# Patient Record
Sex: Male | Born: 1935
Health system: Southern US, Community
[De-identification: ages and names within clinical notes are randomized; demographics above are authoritative.]

## PROBLEM LIST (undated history)

## (undated) DIAGNOSIS — I2510 Atherosclerotic heart disease of native coronary artery without angina pectoris: Secondary | ICD-10-CM

## (undated) DIAGNOSIS — K5790 Diverticulosis of intestine, part unspecified, without perforation or abscess without bleeding: Secondary | ICD-10-CM

## (undated) DIAGNOSIS — Z950 Presence of cardiac pacemaker: Secondary | ICD-10-CM

## (undated) DIAGNOSIS — K52831 Collagenous colitis: Secondary | ICD-10-CM

## (undated) DIAGNOSIS — K219 Gastro-esophageal reflux disease without esophagitis: Secondary | ICD-10-CM

## (undated) DIAGNOSIS — I10 Essential (primary) hypertension: Secondary | ICD-10-CM

## (undated) DIAGNOSIS — E785 Hyperlipidemia, unspecified: Secondary | ICD-10-CM

## (undated) DIAGNOSIS — E119 Type 2 diabetes mellitus without complications: Secondary | ICD-10-CM

## (undated) DIAGNOSIS — T485X5A Adverse effect of other anti-common-cold drugs, initial encounter: Secondary | ICD-10-CM

## (undated) DIAGNOSIS — K469 Unspecified abdominal hernia without obstruction or gangrene: Secondary | ICD-10-CM

## (undated) DIAGNOSIS — J310 Chronic rhinitis: Secondary | ICD-10-CM

## (undated) DIAGNOSIS — J449 Chronic obstructive pulmonary disease, unspecified: Secondary | ICD-10-CM

## (undated) DIAGNOSIS — Z9289 Personal history of other medical treatment: Secondary | ICD-10-CM

## (undated) HISTORY — DX: Hyperlipidemia, unspecified: E78.5

## (undated) HISTORY — DX: Chronic rhinitis: J31.0

## (undated) HISTORY — DX: Diverticulosis of intestine, part unspecified, without perforation or abscess without bleeding: K57.90

## (undated) HISTORY — PX: KNEE ARTHROSCOPY: SHX127

## (undated) HISTORY — DX: Collagenous colitis: K52.831

## (undated) HISTORY — DX: Personal history of other medical treatment: Z92.89

## (undated) HISTORY — PX: GLAUCOMA VALVE INSERTION: SHX5297

## (undated) HISTORY — PX: CORNEAL TRANSPLANT: SHX108

## (undated) HISTORY — DX: Essential (primary) hypertension: I10

## (undated) HISTORY — DX: Unspecified abdominal hernia without obstruction or gangrene: K46.9

## (undated) HISTORY — PX: CORONARY ARTERY BYPASS GRAFT: SHX141

## (undated) HISTORY — PX: HAND SURGERY: SHX662

## (undated) HISTORY — DX: Adverse effect of other anti-common-cold drugs, initial encounter: T48.5X5A

## (undated) HISTORY — DX: Gastro-esophageal reflux disease without esophagitis: K21.9

## (undated) HISTORY — DX: Chronic obstructive pulmonary disease, unspecified: J44.9

## (undated) HISTORY — DX: Atherosclerotic heart disease of native coronary artery without angina pectoris: I25.10

## (undated) HISTORY — PX: CORONARY ANGIOPLASTY: SHX604

---
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
## 1997-04-03 HISTORY — PX: INGUINAL HERNIA REPAIR: SUR1180

## 1997-09-17 ENCOUNTER — Emergency Department (HOSPITAL_COMMUNITY): Admission: EM | Admit: 1997-09-17 | Discharge: 1997-09-17 | Payer: Self-pay | Admitting: Emergency Medicine

## 1997-12-23 ENCOUNTER — Ambulatory Visit (HOSPITAL_BASED_OUTPATIENT_CLINIC_OR_DEPARTMENT_OTHER): Admission: RE | Admit: 1997-12-23 | Discharge: 1997-12-23 | Payer: Self-pay | Admitting: General Surgery

## 2001-04-03 HISTORY — PX: CATARACT EXTRACTION W/ INTRAOCULAR LENS IMPLANT: SHX1309

## 2001-05-23 ENCOUNTER — Ambulatory Visit (HOSPITAL_COMMUNITY): Admission: RE | Admit: 2001-05-23 | Discharge: 2001-05-23 | Payer: Self-pay | Admitting: Ophthalmology

## 2003-02-19 ENCOUNTER — Encounter: Payer: Self-pay | Admitting: Internal Medicine

## 2003-02-19 ENCOUNTER — Encounter (INDEPENDENT_AMBULATORY_CARE_PROVIDER_SITE_OTHER): Payer: Self-pay | Admitting: *Deleted

## 2003-04-04 DIAGNOSIS — I2510 Atherosclerotic heart disease of native coronary artery without angina pectoris: Secondary | ICD-10-CM

## 2003-04-04 HISTORY — DX: Atherosclerotic heart disease of native coronary artery without angina pectoris: I25.10

## 2004-02-02 HISTORY — PX: CARDIAC CATHETERIZATION: SHX172

## 2004-02-04 ENCOUNTER — Ambulatory Visit (HOSPITAL_COMMUNITY): Admission: RE | Admit: 2004-02-04 | Discharge: 2004-02-04 | Payer: Self-pay | Admitting: Cardiovascular Disease

## 2004-02-15 ENCOUNTER — Inpatient Hospital Stay (HOSPITAL_COMMUNITY): Admission: RE | Admit: 2004-02-15 | Discharge: 2004-02-20 | Payer: Self-pay | Admitting: Cardiothoracic Surgery

## 2004-03-31 ENCOUNTER — Encounter: Admission: RE | Admit: 2004-03-31 | Discharge: 2004-03-31 | Payer: Self-pay | Admitting: Cardiothoracic Surgery

## 2009-03-01 ENCOUNTER — Ambulatory Visit: Payer: Self-pay | Admitting: Internal Medicine

## 2009-03-01 DIAGNOSIS — J45991 Cough variant asthma: Secondary | ICD-10-CM | POA: Insufficient documentation

## 2009-03-01 DIAGNOSIS — J309 Allergic rhinitis, unspecified: Secondary | ICD-10-CM | POA: Insufficient documentation

## 2009-03-02 ENCOUNTER — Telehealth: Payer: Self-pay | Admitting: Internal Medicine

## 2009-03-16 ENCOUNTER — Ambulatory Visit: Payer: Self-pay | Admitting: Internal Medicine

## 2009-05-05 ENCOUNTER — Ambulatory Visit: Payer: Self-pay | Admitting: Internal Medicine

## 2009-06-16 ENCOUNTER — Ambulatory Visit: Payer: Self-pay | Admitting: Internal Medicine

## 2009-10-26 ENCOUNTER — Ambulatory Visit: Payer: Self-pay | Admitting: Internal Medicine

## 2009-11-03 ENCOUNTER — Ambulatory Visit: Payer: Self-pay | Admitting: Internal Medicine

## 2009-11-03 DIAGNOSIS — R197 Diarrhea, unspecified: Secondary | ICD-10-CM | POA: Insufficient documentation

## 2009-11-03 DIAGNOSIS — R634 Abnormal weight loss: Secondary | ICD-10-CM | POA: Insufficient documentation

## 2009-11-03 DIAGNOSIS — E119 Type 2 diabetes mellitus without complications: Secondary | ICD-10-CM | POA: Insufficient documentation

## 2009-11-26 ENCOUNTER — Ambulatory Visit: Payer: Self-pay | Admitting: Internal Medicine

## 2009-12-29 ENCOUNTER — Ambulatory Visit: Payer: Self-pay | Admitting: Internal Medicine

## 2010-05-05 NOTE — Procedures (Signed)
Summary: [facility]   Imported By: Lester Cheviot 11/08/2009 07:41:14  _____________________________________________________________________  External Attachment:    Type:   Image     Comment:   External Document

## 2010-05-05 NOTE — Assessment & Plan Note (Signed)
Summary: Followup-diarrhea and weight loss   History of Present Illness Visit Type: Follow-up Visit Primary GI MD: Yancey Flemings MD Primary Provider: Geoffry Paradise, MD Requesting Provider: na Chief Complaint: F/u for diarrhea. Pt states that he is getting better and denies any GI complaints  History of Present Illness:   76 year old with multiple medical problems including long-standing diabetes mellitus, asthma, coronary artery disease status post coronary artery bypass surgery, and hypertension. He was last evaluated November 03, 2009 regarding new problems with diarrhea and weight loss. She did dictation. As noted, he had a negative colonoscopy elsewhere and 2004. His symptoms seem to coincide with PPI therapy and had improved after a course of prednisone (for other purposes). He was felt possibly to have had microscopic colitis. It was elected to follow him expectantly as he was improving. He was asked to followup at this time. Since his last visit, he reports significant improvement in his condition. For the most part, 1-2 formed bowel movements per day. He did have diarrhea for several days while vacationing in [place]. This resolved promptly with a few Imodium. He denies abdominal pain. He is actually had 5 pound weight gain. No other complaints he states he feels well.   GI Review of Systems      Denies abdominal pain, acid reflux, belching, bloating, chest pain, dysphagia with liquids, dysphagia with solids, heartburn, loss of appetite, nausea, vomiting, vomiting blood, weight loss, and  weight gain.        Denies anal fissure, black tarry stools, change in bowel habit, constipation, diarrhea, diverticulosis, fecal incontinence, heme positive stool, hemorrhoids, irritable bowel syndrome, jaundice, light color stool, liver problems, rectal bleeding, and  rectal pain.    Current Medications (verified): 1)  Metoprolol Tartrate 25 Mg Tabs (Metoprolol Tartrate) .[name].. 1 Once Daily 2)  Metformin  Hcl 1000 Mg Tabs (Metformin Hcl) .... Take 1 Tablet By Mouth Two Times A Day 3)  Hydrochlorothiazide 25 Mg Tabs (Hydrochlorothiazide) .... Take 1 Tablet By Mouth Once A Day 4)  Simvastatin 40 Mg Tabs (Simvastatin) .... Take 1 Tablet By Mouth Once A Day 5)  Aspir-Low 81 Mg Tbec (Aspirin) .... Take 1 Tablet By Mouth Once A Day 6)  Glipizide 5 Mg Tabs (Glipizide) .... Take One Tablet  Twice Daily 7)  Dorzolamide Hcl 2 % Soln (Dorzolamide Hcl) .... Twice Daily 8)  Fluticasone Propionate 50 Mcg/act Susp (Fluticasone Propionate) .[name].. 1 Spray Each Notril Once Daily As Needed 9)  Lantus 100 Unit/ml Soln (Insulin Glargine) .... Take 26 Units At Noon 10)  Proair Hfa 108 (90 Base) Mcg/act  Aers (Albuterol Sulfate) .[name].. 1-2 Puffs Every 4-6 Hours As Needed 11)  Alphagan P 0.15 % Soln (Brimonidine Tartrate) .[name].. 1 Drop Each Eye Two Times A Day 12)  Losartan Potassium 100 Mg Tabs (Losartan Potassium) .[name].. 1 Once Daily 13)  Qvar 40 Mcg/act Aers (Beclomethasone Dipropionate) .... As Directed  Allergies (verified): No Known Drug Allergies  Past History:  Past Medical History: Reviewed history from 12/27/2009 and no changes required. Rhinitis Medicamentosa per remote Zonia Kief w/u Asthma..........................................................................Marland KitchenWert  - HFA 50% March 16, 2009 >  75% May 05, 2009 > 75% June 16, 2009 > 90% October 26, 2009  Diabetes glaucoma GERD COPD Hypertension Coronary Artery Disease Hyperlipidemia  Past Surgical History: Reviewed history from 12/27/2009 and no changes required. CABG X 3 bilateral knee surgery  hernia surgery right eye surgery- lens implant Right hand surgery  Family History: Reviewed history from 11/03/2009 and no changes required. Mother-asthma,  died at 73 from hole in her heart Father-emphysema No FH of Colon Cancer:  Social History: Reviewed history from 11/03/2009 and no changes required. Married Lives with wife Never smoked  cigs regularly but stopped all exposure 1960's Denies ETOH Illicit Drug Use - no  Review of Systems       The patient complains of allergy/sinus.  The patient denies anemia, anxiety-new, arthritis/joint pain, back pain, blood in urine, breast changes/lumps, change in vision, confusion, cough, coughing up blood, depression-new, fainting, fatigue, fever, headaches-new, hearing problems, heart murmur, heart rhythm changes, itching, muscle pains/cramps, night sweats, nosebleeds, shortness of breath, skin rash, sleeping problems, sore throat, swelling of feet/legs, swollen lymph glands, thirst - excessive, urination - excessive, urination changes/pain, urine leakage, vision changes, and voice change.    Vital Signs:  Patient profile:   76 year old male Height:      71 inches Weight:      217 pounds BMI:     30.37 BSA:     2.19 Pulse rate:   72 / minute Pulse rhythm:   regular BP sitting:   128 / 74  (left arm) Cuff size:   regular  Vitals Entered By: Ok Anis CMA (December 29, 2009 1:43 PM)  Physical Exam  General:  Well developed, well nourished, no acute distress. Head:  Normocephalic and atraumatic. Eyes:  PERRLA, no icterus. Mouth:  No deformity or lesions. Neck:  Supple; no masses or thyromegaly. Lungs:  Clear throughout to auscultation. Heart:  Regular rate and rhythm; no murmurs, rubs,  or bruits. Abdomen:  Soft, nontender and nondistended. No masses, hepatosplenomegaly or hernias noted. Normal bowel sounds. Pulses:  Normal pulses noted. Extremities:  duodenum Neurologic:  alert oriented Skin:  no jaundice Psych:  Alert and cooperative. Normal mood and affect.   Impression & Recommendations:  Problem # 1:  DIARRHEA (ICD-787.91) problems with diarrhea improved. His previous, either medication reaction or postinfectious IBS type picture. Currently asymptomatic.  Plan: #1. Okay to use Imodium p.r.n. for occasional diarrhea, if it were to occur #2. Follow  p.r.n.  Problem # 2:  WEIGHT LOSS-ABNORMAL (ICD-783.21) problems with weight loss improving. 5 pound weight gain over the past month. No further workup indicated.  Problem # 3:  SCREENING COLORECTAL-CANCER (ICD-V76.51) negative index screening colonoscopy in 2004. Would be due for routine followup screening around 2014.  Patient Instructions: 1)  Please continue current medications.  2)  Please schedule a follow-up appointment as needed.  3)  Copy sent to : Geoffry Paradise, MD 4)  The medication list was reviewed and reconciled.  All changed / newly prescribed medications were explained.  A complete medication list was provided to the patient / caregiver.

## 2010-05-05 NOTE — Assessment & Plan Note (Signed)
Summary: Pulmonary/ acute exac asthma/ cough > incr qvar to 80 bid    Copy to:  self Primary Provider/Referring Provider:  Dr. Geoffry Paradise  CC:  Acute visit.  Pt c/o cough x 1 month- prod with white sputum- worse at night when lies down.  Heat seems to make cough worse.  He c/o PPI causing diarrhea- has had this for months since started famotidine so d/c'ed this med.Nathan Howell  History of Present Illness: 76  yowm never smoker with tendency to bronchitis maintained on Azmacort until January 2010 changed to asthmanex  and did fine on that until June when changed to Symbicort " never right since"  - can't remember whether the problem started before or after the change was made for insurance purposes.  March 01, 2009 cc "not right"  since June 2010 with day > night cough and nasal congestion waxes and wanes without pattern except maybe better some after prednisone.    March 16, 2009 2 wk followup.  Patient states his cough is gone and his breathing is 90 to 100 percent better but concerned re: symbicort expense.  rec change symbicort over to qvar gradually  May 05, 2009 Followup.  Pt states that his breathing has impoved since last seen.  He c/o runny nose since am of ov only.   rec change symbicort to qvar and wean omeprazole  June 16, 2009 6 week rov - Breathing good  - Denies cough or wheezing.  rec qvar 40 2 puffs every 12 hours  October 26, 2009 Acute visit.  Pt c/o cough x 1 month- prod with white sputum- worse at night when lies down.  Heat seems to make cough worse.  He c/o PPI causing diarrhea- has had this for months since started famotidine so d/c'ed this med. not consistent with qvar. Pt denies any significant sore throat, dysphagia, itching, sneezing,  nasal congestion or excess secretions,  fever, chills, sweats, unintended wt loss, pleuritic or exertional cp, hempoptysis, change in activity tolerance  orthopnea pnd or leg swelling Pt also denies any obvious fluctuation in symptoms  with weather or environmental change or other alleviating or aggravating factors.     using saba early in am and not using qvar when gets upl  Current Medications (verified): 1)  Metoprolol Tartrate 25 Mg Tabs (Metoprolol Tartrate) .Nathan Howell.. 1 Once Daily 2)  Metformin Hcl 1000 Mg Tabs (Metformin Hcl) .... Take 1 Tablet By Mouth Two Times A Day 3)  Hydrochlorothiazide 25 Mg Tabs (Hydrochlorothiazide) .... Take 1 Tablet By Mouth Once A Day 4)  Simvastatin 40 Mg Tabs (Simvastatin) .... Take 1 Tablet By Mouth Once A Day 5)  Aspir-Low 81 Mg Tbec (Aspirin) .... Take 1 Tablet By Mouth Once A Day 6)  Glipizide 5 Mg Tabs (Glipizide) .... Take One Tablet  Twice Daily 7)  Dorzolamide Hcl 2 % Soln (Dorzolamide Hcl) .... Twice Daily 8)  Fluticasone Propionate 50 Mcg/act Susp (Fluticasone Propionate) .Nathan Howell.. 1 Spray Each Notril Once Daily As Needed 9)  Qvar 40 Mcg/act Aers (Beclomethasone Dipropionate) .... 2 Puffs First Thing  in Am and 2 Puffs Again in Pm About 12 Hours Later 10)  Lantus 100 Unit/ml Soln (Insulin Glargine) .... Take 26 Units Once Daily 11)  Proair Hfa 108 (90 Base) Mcg/act  Aers (Albuterol Sulfate) .Nathan Howell.. 1-2 Puffs Every 4-6 Hours As Needed 12)  Alphagan P 0.15 % Soln (Brimonidine Tartrate) .Nathan Howell.. 1 Drop Each Eye Two Times A Day 13)  Anti-Diarrheal 2 Mg Tabs (Loperamide Hcl) .Nathan KitchenMarland KitchenMarland Howell  As Directed As Needed 14)  Losartan Potassium 100 Mg Tabs (Losartan Potassium) .Nathan Howell.. 1 Once Daily 15)  Qvar 80 Mcg/act  Aers (Beclomethasone Dipropionate) .... 2 Puffs Every 12 Hours 16)  Prednisone 10 Mg  Tabs (Prednisone) .... 4 Each Am X 2days, 2x2days, 1x2days and Stop  Allergies (verified): No Known Drug Allergies  Past History:  Past Medical History: Rhinitis Medicamentosa per remote Zonia Kief w/u Asthma........................................................................Nathan KitchenWert  - HFA 50% March 16, 2009 >  75% May 05, 2009 > 75% June 16, 2009 >90% October 26, 2009  Diabetes glaucoma  Vital  Signs:  Patient profile:   76 year old male Weight:      215.38 pounds O2 Sat:      94 % on Room air Temp:     97.3 degrees F oral Pulse rate:   74 / minute BP sitting:   122 / 80  (left arm)  Vitals Entered By: Vernie Murders (October 26, 2009 12:00 PM)  O2 Flow:  Room air  Physical Exam  Additional Exam:  amb somber wm nad   Wt 214 November29, 2010 >   223 June 16, 2009 > 215 October 26, 2009  HEENT: nl dentition, , and orophanx.Severe turbinate edema bilaterally, no purulent secretions ? right polyp  Nl external ear canals without cough reflex NECK :  without JVD/Nodes/TM/ nl carotid upstrokes bilaterally LUNGS: no acc muscle use,  mid exp sonorour rhonchi bilaterally  CV:  RRR  no s3 or murmur or increase in P2, no edema   ABD:  soft and nontender with nl excursion in the supine position. No bruits or organomegaly, bowel sounds nl MS:  warm without deformities, calf tenderness, cyanosis or clubbing      Impression & Recommendations:  Problem # 1:  ASTHMA (ICD-493.90)   DDX of  difficult airways managment all start with A and  include Adherence, Ace Inhibitors, Acid Reflux, Active Sinus Disease, Alpha 1 Antitripsin deficiency, Anxiety masquerading as Airways dz,  ABPA,  allergy(esp in young), Aspiration (esp in elderly), Adverse effects of DPI,  Active smokers, plus one B  = Beta blocker use..    Adherence very poor and inconsistent hfa technique.  rec I spent extra time with the patient today explaining optimal mdi  technique.  This improved from  75-90%  with coaching  ? Acid reflux > add back hs h2  ? Beta blocker > may need to be off metaprollol and Bystolic, the most beta -1  selective Beta blocker available in sample form, with bisoprolol the most selective generic choice  on the market.   Each maintenance medication was reviewed in detail including most importantly the difference between maintenance and as needed and under what circumstances the prns are to be used.     Medications Added to Medication List This Visit: 1)  Metoprolol Tartrate 25 Mg Tabs (Metoprolol tartrate) .Nathan Howell.. 1 once daily 2)  Alphagan P 0.15 % Soln (Brimonidine tartrate) .Nathan Howell.. 1 drop each eye two times a day 3)  Anti-diarrheal 2 Mg Tabs (Loperamide hcl) .... As directed as needed 4)  Losartan Potassium 100 Mg Tabs (Losartan potassium) .Nathan Howell.. 1 once daily 5)  Qvar 80 Mcg/act Aers (Beclomethasone dipropionate) .... Take one 30-60 min before first and last meals of the day 6)  Qvar 80 Mcg/act Aers (Beclomethasone dipropionate) .... 2 puffs every 12 hours 7)  Prednisone 10 Mg Tabs (Prednisone) .... 4 each am x 2days, 2x2days, 1x2days and stop  Other Orders: Est. Patient Level IV (62130)  Patient Instructions: 1)  Increase the Qvar 80 Take one 30-60 min before first and last meals of the day 2)  Resume pepcid 20 mg one at bedtime 3)  Prednisone 4 each am x 2days, 2x2days, 1x2days and stop  4)  Please schedule a follow-up appointment in 4 weeks, sooner if needed  Prescriptions: PREDNISONE 10 MG  TABS (PREDNISONE) 4 each am x 2days, 2x2days, 1x2days and stop  #14 x 0   Entered and Authorized by:   Nyoka Cowden MD   Signed by:   Nyoka Cowden MD on 10/26/2009   Method used:   Print then Give to Patient   RxID:   [id number] QVAR 80 MCG/ACT  AERS (BECLOMETHASONE DIPROPIONATE) Take one 30-60 min before first and last meals of the day  #1 x 11   Entered and Authorized by:   Nyoka Cowden MD   Signed by:   Nyoka Cowden MD on 10/26/2009   Method used:   Print then Give to Patient   RxID:   [id number]

## 2010-05-05 NOTE — Assessment & Plan Note (Signed)
Summary: Pulmonary/ fu ov change to qvar 40    Copy to:  self Primary Provider/Referring Provider:  Dr. Griffith Citron, [facility] center  CC:  Followup.  Pt states that his breathing has impoved since last seen.  He c/o runny nose since this am.  No other complaints today.Nathan Howell  History of Present Illness: 76  yowm never smoker with tendency to bronchitis maintained on Azmacort until January 2010 changed to asthmanex  and did fine on that until June when changed to Symbicort " never right since"  - can't remember whether the problem started before or after the change was made for insurance purposes.  March 01, 2009 cc "not right"  since June 2010 with day > night cough and nasal congestion waxes and wanes without pattern except maybe better some after prednisone.    March 16, 2009 2 wk followup.  Patient states his cough is gone and his breathing is 90 to 100 percent better but concerned re: symbicort expense.  rec change symbicort over to qvar gradually  May 05, 2009 Followup.  Pt states that his breathing has impoved since last seen.  He c/o runny nose since am of ov only.  Pt denies any significant chronic co's of  sore throat, dysphagia, itching, sneezing,  nasal congestion or excess secretions,  fever, chills, sweats, unintended wt loss, pleuritic or exertional cp, hempoptysis, change in activity tolerance  orthopnea pnd or leg swelling.   Current Medications (verified): 1)  Metoprolol Tartrate 25 Mg Tabs (Metoprolol Tartrate) .... Take 1 Tablet By Mouth Two Times A Day 2)  Metformin Hcl 1000 Mg Tabs (Metformin Hcl) .... Take 1 Tablet By Mouth Two Times A Day 3)  Hydrochlorothiazide 25 Mg Tabs (Hydrochlorothiazide) .... Take 1 Tablet By Mouth Once A Day 4)  Simvastatin 40 Mg Tabs (Simvastatin) .... Take 1 Tablet By Mouth Once A Day 5)  Aspir-Low 81 Mg Tbec (Aspirin) .... Take 1 Tablet By Mouth Once A Day 6)  Glipizide 5 Mg Tabs (Glipizide) .... 1/2 Tablet To 1 Tablet Twice Daily 7)   Symbicort 80-4.5 Mcg/act Aero (Budesonide-Formoterol Fumarate) .... 2 Puffs First Thing  in Am and 2 Puffs Again in Pm About 12 Hours Later 8)  Dorzolamide Hcl 2 % Soln (Dorzolamide Hcl) .... Twice Daily 9)  Brimonidine Tartrate 0.2 % Soln (Brimonidine Tartrate) .... One Drop Each Eye Once Daily 10)  Ciprofloxacin Hcl 0.3 % Soln (Ciprofloxacin Hcl) .... Once Daily 11)  Losartan Potassium 100 Mg Tabs (Losartan Potassium) .Nathan Howell.. 1 Once Daily 12)  Omeprazole 20 Mg Cpdr (Omeprazole) .Nathan Howell.. 1 30 Min Before First and Last Meals 13)  Famotidine 20 Mg Tabs (Famotidine) .Nathan Howell.. 1 Once Daily 14)  Fluticasone Propionate 50 Mcg/act Susp (Fluticasone Propionate) .Nathan Howell.. 1 Spray Each Notril Once Daily As Needed 15)  Qvar 40 Mcg/act Aers (Beclomethasone Dipropionate) .... 2 Puffs At Bedtime  Allergies (verified): No Known Drug Allergies  Past History:  Past Medical History: Rhinitis Medicamentosa per remote Zonia Kief w/u Asthma   - Previously eval by Kenishia Plack 01/30/95   - HFA 50% March 16, 2009 > 75% May 05, 2009  Diabetes glaucoma  Vital Signs:  Patient profile:   76 year old male Weight:      222.50 pounds O2 Sat:      94 % on Room air Temp:     97.3 degrees F oral Pulse rate:   79 / minute BP sitting:   140 / 78  (left arm)  Vitals Entered By: Vernie Murders (May 05, 2009 3:36 PM)  O2 Flow:  Room air  Physical Exam  Additional Exam:  amb somber wm nad   Wt 214 November29, 2010 > 216 March 16, 2009 > 222 May 05, 2009  HEENT: nl dentition, , and orophanx.Severe turbinate edema bilaterally, no purulent secretions ? right polyp  Nl external ear canals without cough reflex NECK :  without JVD/Nodes/TM/ nl carotid upstrokes bilaterally LUNGS: no acc muscle use, clear to A and P bilaterally without cough on insp or exp maneuvers CV:  RRR  no s3 or murmur or increase in P2, no edema   ABD:  soft and nontender with nl excursion in the supine position. No bruits or organomegaly, bowel  sounds nl MS:  warm without deformities, calf tenderness, cyanosis or clubbing      Impression & Recommendations:  Problem # 1:  ASTHMA (ICD-493.90) All goals of asthma met including optimal function and elimination of symptoms with minimum need for rescue therapy. Contingencies discussed today including the rule of two's.   therefore try qvar 40 2 puffs first thing  in am and 2 puffs again in pm about 12 hours later and begin to taper ppi.  Contingencies discussed.  Hopefully can continue to simplify maint rx. See instructions for specific recommendations   Medications Added to Medication List This Visit: 1)  Losartan Potassium 100 Mg Tabs (Losartan potassium) .Nathan Howell.. 1 once daily 2)  Omeprazole 20 Mg Cpdr (Omeprazole) .... Take  one 30-60 min before first meal of the day 3)  Omeprazole 20 Mg Cpdr (Omeprazole) .Nathan Howell.. 1 30 min before first and last meals 4)  Famotidine 20 Mg Tabs (Famotidine) .Nathan Howell.. 1 once daily 5)  Fluticasone Propionate 50 Mcg/act Susp (Fluticasone propionate) .Nathan Howell.. 1 spray each notril once daily as needed 6)  Qvar 40 Mcg/act Aers (Beclomethasone dipropionate) .... 2 puffs at bedtime 7)  Qvar 40 Mcg/act Aers (Beclomethasone dipropionate) .... 2 puffs first thing  in am and 2 puffs again in pm about 12 hours later  Other Orders: Est. Patient Level III (16109)  Patient Instructions: 1)  ok to leave off second dose of omeprazole 20 but continue pepcid at bedtime 2)  ok to change over to qvar 2 puffs first thing  in am and 2 puffs again in pm about 12 hours later  3)  Please schedule a follow-up appointment in 6 weeks, sooner if needed  Prescriptions: QVAR 40 MCG/ACT AERS (BECLOMETHASONE DIPROPIONATE) 2 puffs first thing  in am and 2 puffs again in pm about 12 hours later  #1 x 11   Entered and Authorized by:   Nyoka Cowden MD   Signed by:   Nyoka Cowden MD on 05/05/2009   Method used:   Print then Give to Patient   RxID:   [id number]

## 2010-05-05 NOTE — Procedures (Signed)
Summary: [facility]  [facility]   Imported By: Lester [place]ga 01/04/2010 07:31:58  _____________________________________________________________________  External Attachment:    Type:   Image     Comment:   External Document

## 2010-05-05 NOTE — Letter (Signed)
Summary: [facility]  [facility]   Imported By: Lamona Curl CMA (AAMA) 12/27/2009 16:40:39  _____________________________________________________________________  External Attachment:    Type:   Image     Comment:   External Document

## 2010-05-05 NOTE — Assessment & Plan Note (Signed)
Summary: diarrhea, wt loss...em   History of Present Illness Visit Type: Initial Consult Primary GI Nathan Howell: Nathan Howell Primary Nathan Howell: Nathan Howell, Nathan Howell Requesting Nathan Howell: Nathan Howell, Nathan Howell Chief Complaint: Patient states that he never had diarrhea until he started omeprazole and famotidine given to him by Dr. Sherene Howell. Since the diarrhea started he has stopped the omeprazole. He noticed the diarrhea starting around May 05, 2009. He has about 11-12 BM per day and he took a round of prednisone and flagyl and since taking the prednisone he feels like his diarrhea has been decreasing. He has lost about 20 lbs since the start of the diarrhea but now has started to gain some of his weight back. Patient had a colonoscopy in 2004 by Dr. Donnie Howell.  History of Present Illness:   76 year old white male with multiple medical problems including long-standing diabetes mellitus, asthma, coronary artery disease status post coronary artery bypass surgery, and hypertension. He presents today regarding chronic anemia. Patient reports to me that he was diagnosed with reflux induced asthma. For this he was placed on omeprazole and famotidine in January. Shortly thereafter he began to develop problems with diarrhea. He took the acid suppressive medications for 3 months and discontinued. Diarrhea persisted. He describes up to 12 loose bowels per day. Not necessarily affected by meals. Nocturnal symptoms present. No blood or steatorrhea. Associated 20 pound weight loss. In late May, he was empirically treated with metronidazole for 10 days. Modest improvement only. Last week, for his lungs, he was placed on a six-day prednisone taper. Start prednisone therapy, he reports resolution of diarrhea. He now states that his bowels are normal. He does tell me that he underwent complete colonoscopy with Dr. Donnie Howell in [place] in November of 2004. He brings pictures. He reports the exam to have been normal without polyps and no further  followup recommended.. GI review of systems is otherwise negative. He denies classic reflux symptoms.   GI Review of Systems    Reports weight loss.   Weight loss of 20 pounds over 5 months.   Denies abdominal pain, acid reflux, belching, bloating, chest pain, dysphagia with liquids, dysphagia with solids, heartburn, loss of appetite, nausea, vomiting, vomiting blood, and  weight gain.      Reports diarrhea.     Denies anal fissure, black tarry stools, change in bowel habit, constipation, diverticulosis, fecal incontinence, heme positive stool, hemorrhoids, irritable bowel syndrome, jaundice, light color stool, liver problems, rectal bleeding, and  rectal pain. Preventive Screening-Counseling & Management      Drug Use:  no.      Current Medications (verified): 1)  Metoprolol Tartrate 25 Mg Tabs (Metoprolol Tartrate) .[name].. 1 Once Daily 2)  Metformin Hcl 1000 Mg Tabs (Metformin Hcl) .... Take 1 Tablet By Mouth Two Times A Day 3)  Hydrochlorothiazide 25 Mg Tabs (Hydrochlorothiazide) .... Take 1 Tablet By Mouth Once A Day 4)  Simvastatin 40 Mg Tabs (Simvastatin) .... Take 1 Tablet By Mouth Once A Day 5)  Aspir-Low 81 Mg Tbec (Aspirin) .... Take 1 Tablet By Mouth Once A Day 6)  Glipizide 5 Mg Tabs (Glipizide) .... Take One Tablet  Twice Daily 7)  Dorzolamide Hcl 2 % Soln (Dorzolamide Hcl) .... Twice Daily 8)  Fluticasone Propionate 50 Mcg/act Susp (Fluticasone Propionate) .[name].. 1 Spray Each Notril Once Daily As Needed 9)  Qvar 40 Mcg/act Aers (Beclomethasone Dipropionate) .... 2 Puffs First Thing  in Am and 2 Puffs Again in Pm About 12 Hours Later 10)  Lantus 100  Unit/ml Soln (Insulin Glargine) .... Take 26 Units At Noon 11)  Proair Hfa 108 (90 Base) Mcg/act  Aers (Albuterol Sulfate) .[name].. 1-2 Puffs Every 4-6 Hours As Needed 12)  Alphagan P 0.15 % Soln (Brimonidine Tartrate) .[name].. 1 Drop Each Eye Two Times A Day 13)  Losartan Potassium 100 Mg Tabs (Losartan Potassium) .[name].. 1 Once Daily 14)   Qvar 80 Mcg/act  Aers (Beclomethasone Dipropionate) .... 2 Puffs Every 12 Hours 15)  Famotidine 20 Mg Tabs (Famotidine) .... Take One By Mouth At Bedtime  Allergies (verified): No Known Drug Allergies  Past History:  Past Medical History: Rhinitis Medicamentosa per remote Nathan Howell w/u Asthma........................................................................Marland KitchenWert  - HFA 50% March 16, 2009 >  75% May 05, 2009 > 75% June 16, 2009 >90% October 26, 2009  Diabetes glaucoma GERD  Past Surgical History: Heart Bypass bilateral knee surgery  hernia surgery right eye surgery- lens implant  Family History: Mother-asthma, died at 77 from hole in her heart Father-emphysema No FH of Colon Cancer:  Social History: Married Lives with wife Never smoked cigs regularly but stopped all exposure 1960's Denies ETOH Illicit Drug Use - no Drug Use:  no  Review of Systems       The patient complains of allergy/sinus, cough, hearing problems, and sleeping problems.  The patient denies anemia, anxiety-new, arthritis/joint pain, back pain, blood in urine, breast changes/lumps, change in vision, confusion, coughing up blood, depression-new, fainting, fatigue, fever, headaches-new, heart murmur, heart rhythm changes, itching, menstrual pain, muscle pains/cramps, night sweats, nosebleeds, pregnancy symptoms, shortness of breath, skin rash, sore throat, swelling of feet/legs, swollen lymph glands, thirst - excessive, urination - excessive, urination changes/pain, urine leakage, vision changes, and voice change.    Vital Signs:  Patient profile:   76 year old male Height:      71 inches Weight:      212.0 pounds BMI:     29.67 Pulse rate:   72 / minute Pulse rhythm:   regular BP sitting:   140 / 80  (left arm) Cuff size:   regular  Vitals Entered By: Nathan Howell CMA Nathan Howell) (November 03, 2009 9:07 AM)  Physical Exam  General:  Well developed, well nourished, no acute distress. Head:   Normocephalic and atraumatic. Eyes:  PERRLA, no icterus. Nose:  No deformity, discharge,  or lesions. Mouth:  No deformity or lesions, dentition normal. Neck:  Supple; no masses or thyromegaly. Lungs:  Clear throughout to auscultation. Heart:  Regular rate and rhythm; no murmurs, rubs,  or bruits. Abdomen:  Soft, nontender and nondistended. No masses, hepatosplenomegaly or hernias noted. Normal bowel sounds. Msk:  Symmetrical with no gross deformities. Normal posture. Pulses:  Normal pulses noted. Extremities:  No clubbing, cyanosis, edema or deformities noted. Neurologic:  Alert and  oriented x4;  grossly normal neurologically. Skin:  Intact without significant lesions or rashes. Psych:  Alert and cooperative. Normal mood and affect.   Impression & Recommendations:  Problem # 1:  DIARRHEA (ICD-787.91) 6 month history of diarrhea as described. Seems to have had resolution after starting prednisone. Now off prednisone and describing normal bowel habits. He may have had a version of microscopic colitis. This can be induced by PPI therapy. Last colonoscopy in 2004 was negative. At this point, I would follow him electively. Assuming he is doing well, I would like to see him back in the office in 6 weeks. However, should he have recurrence of diarrhea, then I would recommend colonoscopy and upper endoscopy with biopsies.  Problem # 2:  WEIGHT LOSS-ABNORMAL (ICD-783.21) seemingly related to problems with diarrhea. Again a few pounds back. Expectant management with followup as outlined above  Patient Instructions: 1)  Please schedule a follow-up appointment in 6 weeks.  2)  Copy sent to : Nathan Howell, Nathan Howell 3)  The medication list was reviewed and reconciled.  All changed / newly prescribed medications were explained.  A complete medication list was provided to the patient / caregiver.

## 2010-05-05 NOTE — Assessment & Plan Note (Signed)
Summary: Pulmonary/   final summary ov with HFA 90%   Copy to:  Geoffry Paradise, MD Primary Provider/Referring Provider:  Geoffry Paradise, MD  CC:  4 wk followup.  Pt states breathing much better.  He denies any complaints today- no cough.  History of Present Illness: 32  yowm never smoker with tendency to bronchitis maintained on Azmacort until January 2010 changed to asthmanex  and did fine on that until June when changed to Symbicort " never right since"  - can't remember whether the problem started before or after the change was made for insurance purposes.  March 01, 2009 cc "not right"  since June 2010 with day > night cough and nasal congestion waxes and wanes without pattern except maybe better some after prednisone.    March 16, 2009 2 wk followup.  Patient states his cough is gone and his breathing is 90 to 100 percent better but concerned re: symbicort expense.  rec change symbicort over to qvar gradually  May 05, 2009 Followup.  Pt states that his breathing has impoved since last seen.  He c/o runny nose since am of ov only.   rec change symbicort to qvar and wean omeprazole  June 16, 2009 6 week rov - Breathing good  - Denies cough or wheezing.  rec qvar 40 2 puffs every 12 hours  October 26, 2009 Acute visit.  Pt c/o cough x 1 month- prod with white sputum- worse at night when lies down.  Heat seems to make cough worse.  He c/o PPI causing diarrhea- has had this for months since started famotidine so d/c'ed this med. not consistent with qvar.  using saba early in am and not using qvar when gets up rec Increase the Qvar 80 Take one 30-60 min before first and last meals of the day Resume pepcid 20 mg one at bedtime Prednisone 4 each am x 2days, 2x2days, 1x2days and stop  page 2 November 26, 2009 cc  breathing much better.  He denies any complaints today- no cough, sob, all smiles.  Pt denies any significant sore throat, dysphagia, itching, sneezing,  nasal congestion or  excess secretions,  fever, chills, sweats, unintended wt loss, pleuritic or exertional cp, hempoptysis, change in activity tolerance  orthopnea pnd or leg swelling Pt denies any   rescue therapy  at all,  denies waking up needing it or having early am exacerbations of coughing/wheezing/ or dyspnea   Allergies: No Known Drug Allergies  Past History:  Past Medical History: Rhinitis Medicamentosa per remote Zonia Kief w/u Asthma..........................................................................Marland KitchenWert  - HFA 50% March 16, 2009 >  75% May 05, 2009 > 75% June 16, 2009 > 90% October 26, 2009  Diabetes glaucoma GERD  Vital Signs:  Patient profile:   76 year old male Weight:      213.13 pounds O2 Sat:      96 % on Room air Temp:     97.4 degrees F oral Pulse rate:   63 / minute BP sitting:   140 / 88  (left arm)  Vitals Entered By: Vernie Murders (November 26, 2009 11:21 AM)  O2 Flow:  Room air  Physical Exam  Additional Exam:  amb somber wm nad   Wt 214  November29, 2010 >   223 June 16, 2009 > 215 October 26, 2009 >  213 November 26, 2009  HEENT: nl dentition, , and orophanx.Severe turbinate edema bilaterally, no purulent secretions ? right polyp  Nl external ear canals without cough  reflex NECK :  without JVD/Nodes/TM/ nl carotid upstrokes bilaterally LUNGS: no acc muscle use,  mid exp sonorour rhonchi bilaterally  CV:  RRR  no s3 or murmur or increase in P2, no edema   ABD:  soft and nontender with nl excursion in the supine position. No bruits or organomegaly, bowel sounds nl MS:  warm without deformities, calf tenderness, cyanosis or clubbing      Impression & Recommendations:  Problem # 1:  ASTHMA (ICD-493.90) All goals of asthma met including optimal function and elimination of symptoms with minimum need for rescue therapy. Contingencies discussed today including the rule of two's.   I spent extra time with the patient today explaining optimal mdi  technique.  This  improved from  75-90% with coaching.  Doing so well ok to try reduce dose of qvar to 40 2 puffs first thing  in am and 2 puffs again in pm about 12 hours later   Other Orders: Est. Patient Level III (04540) HFA Instruction [phone])  Patient Instructions: 1)  Ok to resume qvar 40 2 puffs first thing  in am and 2 puffs again in pm about 12 hours later but if not doing well will a new prescription for the 80 strength 2)  Work on inhaler technique:  relax and blow all the way out then take a nice smooth deep breath back in, triggering the inhaler at same time you start breathing in and hold a few seconds 3)  Return as needed  Prescriptions: QVAR 40 MCG/ACT AERS (BECLOMETHASONE DIPROPIONATE) 2 puffs first thing  in am and 2 puffs again in pm about 12 hours later  #1 x 11   Entered and Authorized by:   Nyoka Cowden MD   Signed by:   Nyoka Cowden MD on 11/26/2009   Method used:   Print then Give to Patient   RxID:   [id number]

## 2010-05-05 NOTE — Assessment & Plan Note (Signed)
Summary: Pulmonary/ f/u ov with HFA 75% effective   Copy to:  self Primary Provider/Referring Provider:  Dr. Griffith Citron, [facility] [place] [facility]  CC:  6 week rov - Breathing good  - Denies cough or wheezing.  History of Present Illness: 70  yowm never smoker with tendency to bronchitis maintained on Azmacort until January 2010 changed to asthmanex  and did fine on that until June when changed to Symbicort " never right since"  - can't remember whether the problem started before or after the change was made for insurance purposes.  March 01, 2009 cc "not right"  since June 2010 with day > night cough and nasal congestion waxes and wanes without pattern except maybe better some after prednisone.    March 16, 2009 2 wk followup.  Patient states his cough is gone and his breathing is 90 to 100 percent better but concerned re: symbicort expense.  rec change symbicort over to qvar gradually  May 05, 2009 Followup.  Pt states that his breathing has impoved since last seen.  He c/o runny nose since am of ov only.   rec change symbicort to qvar and wean omeprazole  June 16, 2009 6 week rov - Breathing good  - Denies cough or wheezing. Pt denies any significant sore throat, dysphagia, itching, sneezing,  nasal congestion or excess secretions,  fever, chills, sweats, unintended wt loss, pleuritic or exertional cp, hempoptysis, change in activity tolerance  orthopnea pnd or leg swelling.  Pt also denies any obvious fluctuation in symptoms with weather or environmental change or other alleviating or aggravating factors.     Pt denies any increase in rescue therapy over baseline, denies waking up needing it or having early am exacerbations of coughing/wheezing/ or dyspnea   Current Medications (verified): 1)  Metoprolol Tartrate 25 Mg Tabs (Metoprolol Tartrate) .... Take 1 Tablet By Mouth Two Times A Day 2)  Metformin Hcl 1000 Mg Tabs (Metformin Hcl) .... Take 1 Tablet By Mouth Two Times A Day 3)   Hydrochlorothiazide 25 Mg Tabs (Hydrochlorothiazide) .... Take 1 Tablet By Mouth Once A Day 4)  Simvastatin 40 Mg Tabs (Simvastatin) .... Take 1 Tablet By Mouth Once A Day 5)  Aspir-Low 81 Mg Tbec (Aspirin) .... Take 1 Tablet By Mouth Once A Day 6)  Glipizide 5 Mg Tabs (Glipizide) .... Take One Tablet  Twice Daily 7)  Dorzolamide Hcl 2 % Soln (Dorzolamide Hcl) .... Twice Daily 8)  Brimonidine Tartrate 0.2 % Soln (Brimonidine Tartrate) .... One Drop Each Eye Once Daily 9)  Ciprofloxacin Hcl 0.3 % Soln (Ciprofloxacin Hcl) .... Once Daily 10)  Losartan Potassium 100 Mg Tabs (Losartan Potassium) .[name].. 1 Once Daily 11)  Omeprazole 20 Mg Cpdr (Omeprazole) .... Take  One 30-60 Min Before First Meal of The Day 12)  Famotidine 20 Mg Tabs (Famotidine) .[name].. 1 Once Daily 13)  Fluticasone Propionate 50 Mcg/act Susp (Fluticasone Propionate) .[name].. 1 Spray Each Notril Once Daily As Needed 14)  Qvar 40 Mcg/act Aers (Beclomethasone Dipropionate) .... 2 Puffs First Thing  in Am and 2 Puffs Again in Pm About 12 Hours Later 15)  Lantus 100 Unit/ml Soln (Insulin Glargine) .... Take 26 Units Once Daily  Allergies (verified): No Known Drug Allergies  Past History:  Past Medical History: Rhinitis Medicamentosa per remote Zonia Kief w/u Asthma   - Previously eval by Adaleigh Warf 01/30/95   - HFA 50% March 16, 2009 >  75% May 05, 2009 > 75% June 16, 2009  Diabetes glaucoma  Vital  Signs:  Patient profile:   76 year old male Height:      71 inches Weight:      223 pounds BMI:     31.21 O2 Sat:      98 % on Room air Temp:     97.4 degrees F oral Pulse rate:   70 / minute BP sitting:   144 / 80  (left arm) Cuff size:   regular  Vitals Entered By: Abigail Miyamoto RN (June 16, 2009 11:42 AM)  O2 Flow:  Room air CC: 6 week rov - Breathing good  - Denies cough or wheezing Comments FBS this am was 124   Physical Exam  Additional Exam:  amb somber wm nad   Wt 214 November29, 2010 > 216 March 16, 2009 >  222 May 05, 2009 > 223 June 16, 2009  HEENT: nl dentition, , and orophanx.Severe turbinate edema bilaterally, no purulent secretions ? right polyp  Nl external ear canals without cough reflex NECK :  without JVD/Nodes/TM/ nl carotid upstrokes bilaterally LUNGS: no acc muscle use, clear to A and P bilaterally without cough on insp or exp maneuvers CV:  RRR  no s3 or murmur or increase in P2, no edema   ABD:  soft and nontender with nl excursion in the supine position. No bruits or organomegaly, bowel sounds nl MS:  warm without deformities, calf tenderness, cyanosis or clubbing      Impression & Recommendations:  Problem # 1:  ASTHMA (ICD-493.90) All goals of asthma met including optimal function and elimination of symptoms with minimum need for rescue therapy. Contingencies discussed today including the rule of two's.   I spent extra time with the patient today explaining optimal mdi  technique.  This improved from  75-75% but no better.    Each maintenance medication was reviewed in detail including most importantly the difference between maintenance and as needed and under what circumstances the prns are to be used. See instructions for specific recommendations   Medications Added to Medication List This Visit: 1)  Glipizide 5 Mg Tabs (Glipizide) .... Take one tablet  twice daily 2)  Lantus 100 Unit/ml Soln (Insulin glargine) .... Take 26 units once daily 3)  Proair Hfa 108 (90 Base) Mcg/act Aers (Albuterol sulfate) .[name].. 1-2 puffs every 4-6 hours as needed  Other Orders: Est. Patient Level III (67893) HFA Instruction [phone])  Patient Instructions: 1)  Qvar 40 2 puffs first thing  in am and 2 puffs again in pm about 12 hours later 2)  Work on inhaler technique:  relax and blow all the way out then take a nice smooth deep breath back in, triggering the inhaler at same time you start breathing in and hold a few seconds 3)  If needed for shot of breath or cough use poaire 2  puffs up to every 4 hours 4)  If your breathing worsens or you need to use your rescue inhaler (proaire) more than twice weekly or wake up more than twice a month with any respiratory symptoms or require more than two rescue inhalers per year, we need to see you right away. 5)  Return to office in 6 months, sooner if needed  6)

## 2010-05-06 ENCOUNTER — Ambulatory Visit (INDEPENDENT_AMBULATORY_CARE_PROVIDER_SITE_OTHER): Payer: Medicare HMO | Admitting: Pulmonary Disease

## 2010-05-06 ENCOUNTER — Encounter: Payer: Self-pay | Admitting: Pulmonary Disease

## 2010-05-06 DIAGNOSIS — R05 Cough: Secondary | ICD-10-CM

## 2010-05-06 DIAGNOSIS — R059 Cough, unspecified: Secondary | ICD-10-CM | POA: Insufficient documentation

## 2010-05-06 DIAGNOSIS — J45909 Unspecified asthma, uncomplicated: Secondary | ICD-10-CM

## 2010-05-13 ENCOUNTER — Encounter: Payer: Self-pay | Admitting: Internal Medicine

## 2010-05-13 ENCOUNTER — Ambulatory Visit: Payer: Medicare HMO | Admitting: Internal Medicine

## 2010-05-13 ENCOUNTER — Ambulatory Visit (INDEPENDENT_AMBULATORY_CARE_PROVIDER_SITE_OTHER): Payer: Medicare HMO | Admitting: Internal Medicine

## 2010-05-13 DIAGNOSIS — J45909 Unspecified asthma, uncomplicated: Secondary | ICD-10-CM

## 2010-05-13 DIAGNOSIS — R059 Cough, unspecified: Secondary | ICD-10-CM

## 2010-05-13 DIAGNOSIS — R05 Cough: Secondary | ICD-10-CM

## 2010-05-19 NOTE — Assessment & Plan Note (Signed)
Summary: acute sick visit for cough   Vital Signs:  Patient profile:   76 year old male Height:      71 inches Weight:      216.25 pounds O2 Sat:      93 % on Room air Temp:     98.1 degrees F oral Pulse rate:   80 / minute BP sitting:   130 / 68  (left arm) Cuff size:   regular  Vitals Entered By: Arman Filter LPN (May 06, 2010 2:03 PM)  O2 Flow:  Room air CC: Sick visit.  MW's pt.  Symptoms started 2 days ago. C/o mostly non-productive cough but will occ cough up small amounts of "foamy" sputum.  pt also c/o sore throat d/t increased coughing and also c/o headache.  Comments Medications reviewed with patient Arman Filter LPN  May 06, 2010 2:04 PM    Copy to:  na Primary Provider/Referring Provider:  Geoffry Paradise, MD  CC:  Sick visit.  MW's pt.  Symptoms started 2 days ago. C/o mostly non-productive cough but will occ cough up small amounts of "foamy" sputum.  pt also c/o sore throat d/t increased coughing and also c/o headache. .  History of Present Illness: the pt comes in today for an acute sick visit.  He has a h/o asthma, and usually is followed by Dr. Sherene Sires.  He comes in today with a 5 week history of cough with paroxysms, and occasionally produces white mucus.  He denies chest congestion, and does not feel his asthma is bothering him.  He has had a nasal voice and some watery nasal drainage anteriorly at times.  He is not sure if he has postnasal drip.  He has no purulence of signficance.  He has a h/o gerd, but denies this being an issue currently.  He states that his cough is worse at night, especially on lying down.    Current Medications (verified): 1)  Metoprolol Tartrate 25 Mg Tabs (Metoprolol Tartrate) .[name].. 1 Once Daily 2)  Metformin Hcl 1000 Mg Tabs (Metformin Hcl) .... Take 1 Tablet By Mouth Two Times A Day 3)  Hydrochlorothiazide 25 Mg Tabs (Hydrochlorothiazide) .... Take 1/2 Tab By Mouth Daily 4)  Simvastatin 40 Mg Tabs (Simvastatin) .... Take 1  Tablet By Mouth Once A Day 5)  Aspir-Low 81 Mg Tbec (Aspirin) .... Take 1 Tablet By Mouth Once A Day 6)  Glipizide 5 Mg Tabs (Glipizide) .... Take One Tablet  Twice Daily 7)  Dorzolamide Hcl 2 % Soln (Dorzolamide Hcl) .[name].. 1 Drop in R Eye Two Times A Day 8)  Fluticasone Propionate 50 Mcg/act Susp (Fluticasone Propionate) .[name].. 1 Spray Each Notril Once Daily As Needed 9)  Lantus 100 Unit/ml Soln (Insulin Glargine) .... Take 26 Units At Noon 10)  Proair Hfa 108 (90 Base) Mcg/act  Aers (Albuterol Sulfate) .[name].. 1-2 Puffs Every 4-6 Hours As Needed 11)  Alphagan P 0.15 % Soln (Brimonidine Tartrate) .[name].. 1 Drop in R Eye Two Times A Day 12)  Losartan Potassium 100 Mg Tabs (Losartan Potassium) .[name].. 1 Once Daily 13)  Qvar 40 Mcg/act  Aers (Beclomethasone Dipropionate) .... Two Puffs Twice Daily.  Can Increased To 4 Puffs Two Times A Day When Having Increased Shortness of Breath  Allergies (verified): No Known Drug Allergies  Past History:  Past medical, surgical, family and social histories (including risk factors) reviewed, and no changes noted (except as noted below).  Past Medical History: Reviewed history from 12/27/2009 and no  changes required. Rhinitis Medicamentosa per remote [facility] w/u Asthma..........................................................................Marland KitchenWert  - HFA 50% March 16, 2009 >  75% May 05, 2009 > 75% June 16, 2009 > 90% October 26, 2009  Diabetes glaucoma GERD COPD Hypertension Coronary Artery Disease Hyperlipidemia  Past Surgical History: Reviewed history from 12/27/2009 and no changes required. CABG X 3 bilateral knee surgery  hernia surgery right eye surgery- lens implant Right hand surgery  Family History: Reviewed history from 11/03/2009 and no changes required. Mother-asthma, died at 93 from hole in her heart Father-emphysema No FH of Colon Cancer:  Social History: Reviewed history from 11/03/2009 and no changes required. Married Lives  with wife Never smoked cigs regularly but stopped all exposure 1960's Denies ETOH Illicit Drug Use - no  Review of Systems       The patient complains of productive cough, non-productive cough, sore throat, headaches, and sneezing.  The patient denies shortness of breath with activity, shortness of breath at rest, coughing up blood, chest pain, irregular heartbeats, acid heartburn, indigestion, loss of appetite, weight change, abdominal pain, difficulty swallowing, tooth/dental problems, nasal congestion/difficulty breathing through nose, itching, ear ache, anxiety, depression, hand/feet swelling, joint stiffness or pain, rash, change in color of mucus, and fever.    Physical Exam  General:  wd male in nad  Nose:  no purulence or discharge seen Mouth:  no exudates or other lesions. Lungs:  totally clear to auscultation Heart:  rrr Extremities:  mild ankle edema, no cyanosis  Neurologic:  alert and oriented, moves all 4.    Impression & Recommendations:  Problem # 1:  COUGH (ICD-786.2) the pt has a cough of 5wks duration that I suspect is more due to upper airway issues than lower.  I suspect he does have an element of PND, and there is no question he has a component of cyclical coughing.  He has a h/o gerd, but it is unclear if this is contributing to his cough.  I do not think asthma is playing a role here, but will give him a short course of prednisone to help with upper and possible lower airway inflammation.  I have also reviewed the behavioral techniques that may help a cyclical cough as well.    Medications Added to Medication List This Visit: 1)  Hydrochlorothiazide 25 Mg Tabs (Hydrochlorothiazide) .... Take 1/2 tab by mouth daily 2)  Dorzolamide Hcl 2 % Soln (Dorzolamide hcl) .[name].. 1 drop in r eye two times a day 3)  Alphagan P 0.15 % Soln (Brimonidine tartrate) .[name].. 1 drop in r eye two times a day 4)  Qvar 40 Mcg/act Aers (Beclomethasone dipropionate) .... Two puffs twice daily.   can increased to 4 puffs two times a day when having increased shortness of breath 5)  Prednisone 10 Mg Tabs (Prednisone) .... Take 4 each day for 2 days, then 3 each day for 2 days, then 2 each day for 2 days, then 1 each day for 2 days, then stop 6)  Tussionex Pennkinetic Er 10-8 Mg/61ml Lqcr (Hydrocod polst-chlorphen polst) .... 5 cc by mouth each night for cough if needed  Other Orders: Est. Patient Level IV (36644)  Patient Instructions: 1)  chlorpheniramine 8mg  one each day at noon until followup visit here. 2)  prilosec OTC one each am before breakfast until next visit 3)  hard candy during the day to keep from coughing and clearing throat...no cough drops 4)  limit voice use during the day 5)  will treat with a course  of prednisone 6)  stay on qvar 7)  cough syrup to take at night only if needed. 8)  followup with Dr. Sherene Sires one week   Prescriptions: Sandria Senter ER 10-8 MG/5ML LQCR (HYDROCOD POLST-CHLORPHEN POLST) 5 cc by mouth each night for cough if needed  #4 ounces x 0   Entered and Authorized by:   Barbaraann Share MD   Signed by:   Barbaraann Share MD on 05/06/2010   Method used:   Print then Give to Patient   RxID:   [id number] PREDNISONE 10 MG  TABS (PREDNISONE) take 4 each day for 2 days, then 3 each day for 2 days, then 2 each day for 2 days, then 1 each day for 2 days, then stop  #20 x 0   Entered and Authorized by:   Barbaraann Share MD   Signed by:   Barbaraann Share MD on 05/06/2010   Method used:   Print then Give to Patient   RxID:   [id number]    Orders Added: 1)  Est. Patient Level IV [66440]

## 2010-05-19 NOTE — Assessment & Plan Note (Addendum)
Summary: Pulmonary/ ext ov with hfa 50%   Copy to:  na Primary Provider/Referring Provider:  Geoffry Paradise, MD  CC:  Cough- resolved.  History of Present Illness: 76 yowm never smoker with tendency to rhinitis/ bronchitis maintained on Azmacort until January 2010 changed to asthmanex  and did fine on that until June when changed to Symbicort " never right since"  - can't remember whether the problem started before or after the change was made for insurance purposes.  11/26/2009 ov cough and breathing fine on qvar 80  2 puffs first thing  in am and 2 puffs again in pm about 12 hours later  rec try the 40 2bid and did fine until January when  cough returned  05/06/10 seen by Clance with flare of cough rec chlorpheniramine 8mg  one each day at noon until followup visit here. prilosec OTC one each am before breakfast until next visit hard candy during the day to keep from coughing and clearing throat...no cough drops limit voice use during the day will treat with a course of prednisone stay on qvar cough syrup to take at night only if needed.   May 13, 2010 ov cc cough better on prednisone, mucus is thick slt grey, c/o nasal stuffiness and congestion, breathing ok.  cough worse at hs, not using pepcid as rec thinks it might have caused diarrhea in the past. Pt denies any significant sore throat, dysphagia, itching, sneezing,   fever, chills, sweats, unintended wt loss, pleuritic or exertional cp, hempoptysis, change in activity tolerance  orthopnea pnd or leg swelling. Pt also denies any obvious fluctuation in symptoms with weather or environmental change or other alleviating or aggravating factors.          Current Medications (verified): 1)  Metoprolol Tartrate 25 Mg Tabs (Metoprolol Tartrate) .[name].. 1 Once Daily 2)  Metformin Hcl 1000 Mg Tabs (Metformin Hcl) .... Take 1 Tablet By Mouth Two Times A Day 3)  Hydrochlorothiazide 25 Mg Tabs (Hydrochlorothiazide) .... Take 1/2 Tab By Mouth  Daily 4)  Simvastatin 40 Mg Tabs (Simvastatin) .... Take 1 Tablet By Mouth Once A Day 5)  Aspir-Low 81 Mg Tbec (Aspirin) .... Take 1 Tablet By Mouth Once A Day 6)  Glipizide 5 Mg Tabs (Glipizide) .... Take One Tablet  Twice Daily 7)  Dorzolamide Hcl 2 % Soln (Dorzolamide Hcl) .[name].. 1 Drop in R Eye Two Times A Day 8)  Fluticasone Propionate 50 Mcg/act Susp (Fluticasone Propionate) .[name].. 1 Spray Each Notril Once Daily As Needed 9)  Lantus 100 Unit/ml Soln (Insulin Glargine) .... Take 26 Units At Noon 10)  Proair Hfa 108 (90 Base) Mcg/act  Aers (Albuterol Sulfate) .[name].. 1-2 Puffs Every 4-6 Hours As Needed 11)  Alphagan P 0.15 % Soln (Brimonidine Tartrate) .[name].. 1 Drop in R Eye Two Times A Day 12)  Losartan Potassium 100 Mg Tabs (Losartan Potassium) .[name].. 1 Once Daily 13)  Qvar 40 Mcg/act  Aers (Beclomethasone Dipropionate) .... Two Puffs Twice Daily.  Can Increased To 4 Puffs Two Times A Day When Having Increased Shortness of Breath 14)  Prednisone 10 Mg  Tabs (Prednisone) .... Take 4 Each Day For 2 Days, Then 3 Each Day For 2 Days, Then 2 Each Day For 2 Days, Then 1 Each Day For 2 Days, Then Stop 15)  Tussionex Pennkinetic Er 10-8 Mg/79ml Lqcr (Hydrocod Polst-Chlorphen Polst) .... 5 Cc By Mouth Each Night For Cough If Needed  Allergies (verified): No Known Drug Allergies  Past History:  Past Medical  History: Rhinitis Medicamentosa per remote [facility] w/u Asthma..........................................................................Marland KitchenWert  - HFA 50% March 16, 2009 > 50% May 13, 2010  Diabetes glaucoma GERD Hypertension Coronary Artery Disease Hyperlipidemia  Vital Signs:  Patient profile:   76 year old male Weight:      213 pounds O2 Sat:      97 % on Room air Temp:     98.0 degrees F oral Pulse rate:   70 / minute BP sitting:   124 / 72  (left arm)  Vitals Entered By: Vernie Murders (May 13, 2010 1:47 PM)  O2 Flow:  Room air  Physical Exam  Additional Exam:  amb  somber wm nad   Wt 214  November29, 2010 >   223 June 16, 2009 > 215 October 26, 2009 >  213 November 26, 2009 > 216 May 13, 2010  HEENT: nl dentition  Oropharanx c/w active mucoid pnds. .Severe turbinate edema bilaterally, no purulent secretions ? right polyp  Nl external ear canals without cough reflex NECK :  without JVD/Nodes/TM/ nl carotid upstrokes bilaterally LUNGS: no acc muscle use,  mid exp sonorour rhonchi bilaterally  CV:  RRR  no s3 or murmur or increase in P2, no edema   ABD:  soft and nontender with nl excursion in the supine position. No bruits or organomegaly, bowel sounds nl MS:  warm without deformities, calf tenderness, cyanosis or clubbing      Impression & Recommendations:  Problem # 1:  COUGH (ICD-786.2)  The most common causes of chronic cough in immunocompetent adults include: upper airway cough syndrome (UACS), previously referred to as postnasal drip syndrome,  caused by variety of rhinosinus conditions; (2) asthma; (3) GERD; (4) chronic bronchitis from cigarette smoking or other inhaled environmental irritants; (5) nonasthmatic eosinophilic bronchitis; and (6) bronchiectasis. These conditions, singly or in combination, have accounted for up to 94% of the causes of chronic cough in prospective studies.   This is most c/w  Classic Upper airway cough syndrome, so named because it's frequently impossible to sort out how much is  CR/sinusitis with freq throat clearing (which can be related to primary GERD)   vs  causing  secondary extra esophageal GERD from wide swings in gastric pressure that occur with throat clearing, promoting self use of mint and menthol lozenges that reduce the lower esophageal sphincter tone and exacerbate the problem further These are the same pts who not infrequently have failed to tolerate ace inhibitors,  dry powder inhalers or biphosphonates or report having reflux symptoms that don't respond to standard doses of PPI   Active pnds with excess  mucus worse at hs so need to approach this on two levels:  1)I emphasized that nasal steroids have no immediate benefit in terms of improving symptoms.  To help them reached the target tissue, the patient should use Afrin two puffs every 12 hours applied one min before using the nasal steroids.  Afrin should be stopped after no more than 5 days.  If the symptoms worsen, Afrin can be restarted after 5 days off of therapy to prevent rebound congestion from overuse of Afrin.  I also emphasized that in no way are nasal steroids a concern in terms of "addiction".   2) Add pepcid at bedtime as long as coughing (doubt caused diarrhea)   Orders: Est. Patient Level IV (56387)  Problem # 2:  ASTHMA (ICD-493.90)   DDX of  difficult airways managment all start with A and  include Adherence, Ace Inhibitors, Acid Reflux,  Active Sinus Disease, Alpha 1 Antitripsin deficiency, Anxiety masquerading as Airways dz,  ABPA,  allergy(esp in young), Aspiration (esp in elderly), Adverse effects of DPI,  Active smokers, plus two Bs  = Bronchiectasis and Beta blocker use..and one C= CHF    Adherence: I spent extra time with the patient today explaining optimal mdi  technique.  This improved from  25-50% p coaching Previously had good response to qvar 80 so rechallenge.   Active sinus dz:  next step is sinus ct  Medications Added to Medication List This Visit: 1)  Qvar 80 Mcg/act Aers (Beclomethasone dipropionate) .... 2 puffs first thing  in am and 2 puffs again in pm about 12 hours later  Patient Instructions: 1)  Pepcid 20mg  one at bedtime as long as you are coughing or needing to take cough medication 2)  Increase the qvar to 80  2 puffs first thing  in am and 2 puffs again in pm about 12 hours later but work on technique, smooth and deep triggering the inhaler when you start breathing in 3)  if short or actively coughing try proaire 2 puffs every 4 hours 4)  Please schedule a follow-up appointment in 6 weeks,  sooner if needed with all your medications separated in two  bags one that you take no matter what vs the meds you only take as needed 5)  ADD 6)  Will call in 3 days to see if better and if not schedule a sinus CT lmited  Prescriptions: QVAR 80 MCG/ACT  AERS (BECLOMETHASONE DIPROPIONATE) 2 puffs first thing  in am and 2 puffs again in pm about 12 hours later  #1 x 11   Entered and Authorized by:   Nyoka Cowden MD   Signed by:   Nyoka Cowden MD on 05/13/2010   Method used:   Print then Give to Patient   RxID:   [id number]   Appended Document: Pulmonary/ ext ov with hfa 50% Called and spoke with pt and and notified if not doing better today will order sinus ct.  Pt states that he feels he is doing much better and this is not needed. Will followup as sched.

## 2010-06-15 ENCOUNTER — Encounter: Payer: Self-pay | Admitting: Internal Medicine

## 2010-06-24 ENCOUNTER — Encounter: Payer: Self-pay | Admitting: Internal Medicine

## 2010-06-24 ENCOUNTER — Ambulatory Visit (INDEPENDENT_AMBULATORY_CARE_PROVIDER_SITE_OTHER): Payer: Medicare HMO | Admitting: Internal Medicine

## 2010-06-24 DIAGNOSIS — R059 Cough, unspecified: Secondary | ICD-10-CM

## 2010-06-24 DIAGNOSIS — J45909 Unspecified asthma, uncomplicated: Secondary | ICD-10-CM

## 2010-06-24 DIAGNOSIS — R05 Cough: Secondary | ICD-10-CM

## 2010-06-24 NOTE — Progress Notes (Signed)
  Subjective:    Patient ID: Nathan Howell, male    DOB: Jan 03, 1935, 76 y.o.   MRN: [id number]  HPI 8 yowm never smoker with tendency to rhinitis/ bronchitis maintained on Azmacort until January 2010 changed to asthmanex and did fine on that until June when changed to Symbicort " never right since" - can't remember whether the problem started before or after the change was made for insurance purposes.   11/26/2009 ov cough and breathing fine on qvar 80 2 puffs first thing in am and 2 puffs again in pm about 12 hours later rec try the 40 2bid and did fine until January when cough returned   05/06/10 seen by Clance with flare of cough  rec chlorpheniramine 8mg  one each day at noon until followup visit here.  prilosec OTC one each am before breakfast until next visit  hard candy during the day to keep from coughing and clearing throat...no cough drops  limit voice use during the day  will treat with a course of prednisone  stay on qvar  cough syrup to take at night only if needed.   May 13, 2010 ov cc cough better on prednisone, mucus is thick slt grey, c/o nasal stuffiness and congestion, breathing ok. cough worse at hs, not using pepcid as rec thinks it might have caused diarrhea in the past  rec pepcid at hs until no cough, increase qvar to 80 and keep workin on inhaler technique  06/24/10 ov much better on present rx ,  No need for rescue, no sign cough. Pt denies any significant sore throat, dysphagia, itching, sneezing,  nasal congestion or excess/ purulent secretions,  fever, chills, sweats, unintended wt loss, pleuritic or exertional cp, hempoptysis, orthopnea pnd or leg swelling.    Also denies any obvious fluctuation of symptoms with weather or environmental changes or other aggravating or alleviating factors.    Past Medical History:  Rhinitis Medicamentosa per remote [facility] w/u  Asthma..........................................................................Marland KitchenWert  - HFA 50%  March 16, 2009 > 50% May 13, 2010 > 75% .06/24/10 Diabetes  glaucoma  GERD  Hypertension  Coronary Artery Disease  Hyperlipidemia       Review of Systems     Objective:   Physical Exam Wt 214 November29, 2010 >  > 213 November 26, 2009 > 216 May 13, 2010 > 215 06/24/10 HEENT: nl dentition Oropharanx c/w active mucoid pnds.   Moderate  turbinate edema bilaterally, no purulent secretions ? right polyp  Nl external ear canals without cough reflex  NECK : without JVD/Nodes/TM/ nl carotid upstrokes bilaterally  LUNGS: no acc muscle use, mid exp sonorour rhonchi bilaterally  CV: RRR no s3 or murmur or increase in P2, no edema  ABD: soft and nontender with nl excursion in the supine position. No bruits or organomegaly, bowel sounds nl  MS: warm without deformities, calf tenderness, cyanosis or clubbing       Assessment & Plan:

## 2010-06-24 NOTE — Patient Instructions (Addendum)
No change in your pulmonary medications for now  Return in 3 months with PFT's same day

## 2010-06-25 ENCOUNTER — Encounter: Payer: Self-pay | Admitting: Internal Medicine

## 2010-06-25 NOTE — Assessment & Plan Note (Signed)
Classic Upper airway cough syndrome, so named because it's frequently impossible to sort out how much is  CR/sinusitis with freq throat clearing (which can be related to primary GERD)   vs  causing  secondary (" extra esophageal")  GERD from wide swings in gastric pressure that occur with throat clearing, often  promoting self use of mint and menthol lozenges that reduce the lower esophageal sphincter tone and exacerbate the problem further in a cyclical fashion.   These are the same pts who not infrequently have failed to tolerate ace inhibitors,  dry powder inhalers or biphosphonates or report having reflux symptoms that don't respond to standard doses of PPI , and are easily confused as having aecopd or asthma flares,

## 2010-06-25 NOTE — Assessment & Plan Note (Addendum)
All goals of chronic asthma control met including optimal function and elimination of symptoms with minimal need for rescue therapy.  Contingencies discussed in full including contacting this office immediately if not controlling the symptoms using the rule of two's.     Needs baseline pfts on return

## 2010-08-19 NOTE — Discharge Summary (Signed)
NAMEBARNABY, RIPPEON NO.:  [id number]   MEDICAL RECORD NO.:  [id number]          PATIENT TYPE:  INP   LOCATION:  2035                         FACILITY:  [facility]   PHYSICIAN:  Sheliah Plane, MD    DATE OF BIRTH:  22-Sep-1934   DATE OF ADMISSION:  02/15/2004  DATE OF DISCHARGE:  02/20/2004                                 DISCHARGE SUMMARY   ADMITTING DIAGNOSES:  Coronary occlusive disease.   PAST MEDICAL HISTORY:  1.  Hypertension.  2.  Hyperlipidemia.  3.  Diabetes mellitus type 2.  Recent hemoglobin A1C 7.05 September 2003.  4.  Glaucoma.  5.  Asthma.   PAST SURGICAL HISTORY:  Bilateral arthroscopic surgery of the knees.   ALLERGIES:  SHELLFISH.   DISCHARGE DIAGNOSES:  Two vessel coronary artery disease status post  coronary artery bypass graft.   BRIEF HISTORY:  Mr. Crisci is a 76 year old Caucasian man.  Several months  ago he became faint and lightheaded after working outside in the heat.  Other symptoms include dyspnea with exertion.  He was evaluated by Dr. Tresa Endo  at [facility] who recommended echocardiogram.  This was performed on January 19, 2004.  Revealed mild left ventricular  hypertrophy, left atrium moderately dilated, mitral valve mildly thickened,  no mitral regurgitation.  Dr. Tresa Endo further recommended cardiac  catheterization and this was performed by Dr. Tresa Endo as well.  This revealed  two vessel coronary artery disease not amenable to PCI.  Cardiac surgery  consultation was requested and he was evaluated by Dr. Sheliah Plane on  February 11, 2004.  After examination of the patient and review of all  available records, Dr. Tyrone Sage recommended proceeding with coronary artery  bypass grafting.  The procedure, risks, and benefits were all discussed with  Mr. Crissman and he agreed to proceed with surgery.   HOSPITAL COURSE:  On February 15, 2004 Mr. Boakye was electively admitted  [facility] under the  care of Dr. Sheliah Plane.  He underwent  the following surgical procedure:  Coronary artery bypass grafting x3.  Grafts placed at the time of procedure:  Left internal mammary artery graft  to the left anterior descending artery, saphenous vein graft to the ramus  intermediate artery, saphenous vein graft to the distal circumflex artery.  Vein was harvested from the right thigh with an endovein harvest technique.  He tolerated this procedure well transferring in stable condition to the  SICU.  He remained hemodynamically stable in the immediate postoperative  period.  Was extubated several hours after arrival into intensive care unit.  He woke from anesthesia neurologically intact.  He required only routine  care in the intensive care unit, was ready for transfer to unit 2000 on  postoperative day one.   Overall, Mr. Linsley is making very good progress in recovering from his  surgery.  His diabetes was managed with the Glucommander protocol in the  immediate perioperative period.  He was restarted on his oral diabetes  medications postoperative day one.  His blood glucose levels have been  acceptable.  Please note that  his Glucophage has only been restarted at half  his usual dose.   Mr. Memmott asthma has been treated with his home medication of Azmacort.  While in the hospital he did have some green sputum postoperative day three.  He was started on a short course of Avelox.  His respiratory status remains  good.  He is no longer requiring any supplemental oxygen.   On morning rounds February 19, 2004, postoperative day four, Mr. Ferdig  reports feeling very well.  His vital signs are stable, blood pressure  142/82.  He is afebrile.  His room air saturation is 96%.  His heart is  maintaining normal sinus rhythm, 69 beats per minute.  His lungs are clear  to auscultation today.  His sputum is clear today.  He is tolerating his  diet without nausea.  His bowel and bladder  functions are within normal  limits for him.  His incisions are all healing well.  He has no lower  extremity edema.  He is ambulating in the hallway with cardiac  rehabilitation and minimal assistance.  His pain is well controlled.  His  only complaint is that he is not sleeping well here in the hospital.  If Mr.  Varma continues to progress in this manner it is anticipated he will be  ready for discharge home tomorrow February 20, 2004.   LABORATORIES:  November 18 CBC:  White blood cells 5.3, hemoglobin 11.1,  hematocrit 31.5, platelets 228.  On November 16 chemistries included sodium  136, potassium 3.9, BUN 16, creatinine 0.8, glucose 143.   CONDITION ON DISCHARGE:  Improved.   DISCHARGE INSTRUCTIONS:   MEDICATIONS:  1.  Enteric-coated aspirin 325 mg daily.  This is a new dose for him.  2.  Lopressor 12.5 mg p.o. b.i.d.  3.  Diovan 40 mg daily.  This is a new dose for him.  4.  Lipitor 10 mg q.h.s.  5.  Actos 15 mg daily.  6.  Starlix 120 mg t.i.d.  7.  Metformin 500 mg b.i.d.  8.  He is to continue his home regimen of eye drops Restasis, Alphagan, and      Trusopt one drop each eye b.i.d.  9.  Azmacort four puffs b.i.d.  10. For pain management he may have Tylox one to two p.o. q.4h. p.r.n. for      moderate to severe pain or Tylenol 325 mg one to two p.o. q.4h. for mild      pain.   DIET:  Continue to be a carbohydrate modified, medium calorie diet.   WOUND CARE:  He is to shower daily with mild soap and water.  If his  incision shows any sign of infection he is to call Dr. Dennie Maizes office.   He has been advised to abstain from tobacco chewing.  He has also been seen  by the tobacco cessation counselor while here in the hospital.  He will be  followed up in the outpatient setting via telephone calls.   Dr. Tresa Endo would like to see him back in his office in approximately two weeks.  Patient has been asked to call to arrange that appointment and given  the office  phone number.  Dr. Tyrone Sage would like to see him back at the  [facility] office on Thursday, December 29 at 11:40 in the morning.  He will be  asked to have a chest x-ray at [facility] one hour prior to this appointment and  bring the x-ray for Dr. Tyrone Sage to  review.      [place]   CTK/MEDQ  D:  02/19/2004  T:  02/20/2004  Job:  [id number]   cc:   Nicki Guadalajara, M.D.  [phone] [address], [address]  [place], [place] [address]  Fax: [phone]

## 2010-08-19 NOTE — Consult Note (Signed)
NAMEEDGEL, DEGNAN NO.:  [id number]   MEDICAL RECORD NO.:  [id number]          PATIENT TYPE:  OUT   LOCATION:  RDC                           FACILITY:  [facility]   PHYSICIAN:  Sheliah Plane, MD    DATE OF BIRTH:  [birth year]   DATE OF CONSULTATION:  02/11/2004  DATE OF DISCHARGE:  02/04/2004                                   CONSULTATION   REASON FOR CONSULTATION:  Coronary artery disease.   HISTORY OF PRESENT ILLNESS:  The patient is a 76 year old male who several  months ago, became very faint and light headed after working outside in the  heat. He has also noted increasing shortness of breath with exertion and at  times when talking. He denies any definite chest pain. He has had no  previous history of myocardial infarction. Does have a history of  hypertension, hyperlipidemia, 6 years of diabetes. His most recent  hemoglobin A1C is 7.4 done in June of 2005. He has no hyperlipidemia. Denies  cigarette smoking.   FAMILY HISTORY:  Has a positive family history. His father died at age 95 of  emphysema. Mother died at age 10 with rheumatic heart disease. He denies any  previous history of renal insufficiency.   PAST MEDICAL HISTORY:  Significant for glaucoma, asthma treated by Dr. Sherene Sires  and remains on chronic Asthma-Cort therapy. He had had bilateral  arthroscopies of the knees.   SOCIAL HISTORY:  The patient is married. Lives with his wife. He is retired.   MEDICATIONS:  Include 1 baby aspirin, __________ for glaucoma 1 drop two  times a day, Alphagan and Trusopt for glaucoma 1 drop two times a day.  Asthma-Cort 4 puffs two times a day, Lipitor 10 mg a day, Actos 15 mg a day,  Metformin 500 mg four times a day, Starlix 120 mg three times a day, Diovan  80 mg once a day (recently started last week).   PAST SURGICAL HISTORY:  Includes eye surgery in 1948 and 1984 and 1999.  Right inguinal hernia repair in 2003. Glaucoma right eye 2004. Corneal  transplant  right eye 1991 and 1993. Knee surgery.   SOCIAL HISTORY:  The patient does not smoke. He is married and lives with  his wife. Notes heart disease.   REVIEW OF SYSTEMS:  He denies chest pain, resting shortness of breath,  orthopnea, syncope, pre-syncope. He does not palpitations, lower extremity  edema and exertional shortness of breath.   ALLERGIES:  Include shellfish.   REVIEW OF SYSTEMS:  He denies change in bowel habits. Denies blood in his  stool. Denies claudication. Denies amaurosis or transient ischemic attacks.  Does note visual loss. Wears glasses. Does complain of wheezing, shortness  of breath with exertion, asthma, bronchitis, episodic cough. Denies any  change in urination or blood in urine.   PHYSICAL EXAMINATION:  VITAL SIGNS:  Blood pressure 160/90, pulse 65,  respiratory rate 18, O2 saturation 97%.  NECK:  No carotid bruits.  LUNGS:  Clear bilaterally.  CARDIAC:  Regular rate and rhythm. He has no murmur or gallop. His aorta is  not palpably enlarged.  ABDOMEN:  Benign examination without palpable masses.  EXTREMITIES:  Lower reveals slight bilateral pedal edema with 2+ DP and PT  pulses bilaterally.   LABORATORY DATA:  Echocardiogram was done on January 19, 2004 at  [facility]. There was mild left ventricular  hypertrophy. Left atrium moderately dilated. Mitral valve is mildly  thickened. There is no mitral regurgitation.   Exercise stress test, negative Bruce protocol. Scintigraphic evidence of  inferior wall scar. Possibility of lateral wall ischemia was discussed.   IMPRESSION/PLAN:  Because of the patient's symptoms and high risk for  coronary disease, a cardiac catheterization was recommended and performed.  The cardiac catheterization was performed by Dr. Tresa Endo. This reveals 60%  proximal left anterior descending lesion, a small non-dominant right  coronary system, a circumflex system with a moderate size ramus branch both   with 80% stenosis. Overall ventricular function appeared preserved. Because  of the patient's coronary anatomy, he was not suitable for angioplasty in  his diabetic status. Coronary artery bypass grafting has been recommended.  The patient agreed and signed informed consent. Description of the procedure  including risks of death, infection, stroke, myocardial infarction,  bleeding, and blood transfusion were all discussed with the patient and his  family in detail. Will plan to proceed with surgery on Monday, February 15, 2004.      Edwa   EG/MEDQ  D:  02/11/2004  T:  02/11/2004  Job:  [id number]   cc:   Nicki Guadalajara, M.D.  [phone] [address]  [place], [place] [address]  Fax: [phone]   Dr. Leeroy Bock, [place].

## 2010-08-19 NOTE — Op Note (Signed)
Nathan Howell, Nathan Howell NO.:  [id number]   MEDICAL RECORD NO.:  [id number]          PATIENT TYPE:  INP   LOCATION:  2035                         FACILITY:  [facility]   PHYSICIAN:  Sheliah Plane, MD    DATE OF BIRTH:  11-10-1934   DATE OF PROCEDURE:  02/15/2004  DATE OF DISCHARGE:                                 OPERATIVE REPORT   PREOPERATIVE DIAGNOSIS:  Coronary occlusive disease.   POSTOPERATIVE DIAGNOSIS:  Coronary occlusive disease.   SURGICAL PROCEDURE:  Coronary artery bypass grafting x3 with left internal  mammary to the left anterior descending coronary artery, reversed saphenous  vein graft to the intermediate coronary artery, and reversed saphenous vein  graft to the circumflex coronary artery with right thigh endo vein  harvesting.   SURGEON:  Sheliah Plane, M.D.   FIRST ASSISTANT:  Toribio Harbour, N.P.   BRIEF HISTORY:  The patient is a 76 year old diabetic male who presents with  symptoms of marked increase in fatigue and several episodes of significant  diaphoresis.  Because of these symptoms, a stress test was performed, which  was equivocal.  However, because of his strong family history, diabetes, and  high risk for coronary disease, coronary angiography was recommended to the  patient who agreed and signed informed consent.   INDICATIONS FOR PROCEDURE:  At the time of catheterization, the patient was  found to have approximately 60% left anterior descending coronary  obstruction, a large intermediate coronary artery with an 80-90% stenosis,  and a circumflex with 70 to 80% stenosis.  The right coronary artery was a  small, nondominant vessel.  The circumflex was dominant.  The coronary  artery bypass grafting was recommended to the patient who agreed and signed  informed consent.   DESCRIPTION OF PROCEDURE:  With Swan-Ganz and arterial line monitors in  place, the patient underwent general endotracheal anesthesia without  incidence.   The skin of the chest and legs was prepped with Betadine and  draped in the usual sterile manner.  Using the Guidant endo vein harvesting  system, vein was harvested from the right thigh and was of excellent quality  and caliber.  A median sternotomy was performed.  The left internal mammary  artery was dissected down to its pedicle graft.  The distal artery was  divided and had good free flow.  The pericardium was opened.  Overall,  ventricular function appeared preserved. The patient was systemically  heparinized.  The ascending aorta and the right atrium were cannulated.  An  aortic root vent cardioplegia was introduced into the ascending aorta.  The  patient was placed on cardiopulmonary bypass 2.4 L/minute per sq m.  Sites  of anastomoses were dissected out of the epicardium.  Aortic crossclamp was  applied, and 500 mL of cold blood potassium cardioplegia was administered  with a rapid diastolic arrest of the heart.  Myocardial septal temperatures  were monitored throughout the crossclamp period.  Attention was turned first  to the distal circumflex, which was partially an intramyocardial vessel.  The vessel was opened and admitted a 1.5 mm probe.  Using a  running 7-0  Prolene, distal anastomosis was performed.  The intermediate coronary artery  was located in an intramyocardial position high on the lateral wall.  This  vessel was dissected out of the muscle and was opened and admitted a 1.5 mm  probe.  Using a running 7-0 Prolene, distal anastomosis was performed.  Attention was then turned to the left anterior descending coronary artery  between the mid and distal third.  The vessel was opened and admitted a 1.5  mm probe proximally and distally.  Using a running 8-0 Prolene, the left  internal mammary artery was anastomosed to the left anterior descending  coronary artery.  With release of that bulldog on the mammary artery, there  was appropriate rise in myocardial septal  temperature.  Aortic crossclamp  was removed with a total crossclamp time of 41 minutes.  A partial occlusion  clamp was placed on the ascending aorta, and two punch aortotomies were  performed.  Each of the two vein grafts was anastomosed to the ascending  aorta; air was evacuated from the grafts; and partial occlusion clamp was  remove.  As such, anastomoses were inspected and were free of bleeding.  The  patient was then ventilated and weaned from cardiopulmonary bypass without  difficulty.  He remained hemodynamically stable.  He was decannulated in the  usual fashion.  Protamine sulfate was administered with operative field  hemostatic through which two ventricular pacing wires were applied.  _____________.  A left pleural tubing and two mediastinal tubes were left in  place.  The sternum was closed with #6 stainless steel wire.  The fascia was  closed with interrupted 0 Vicryl, running 3-0 Vicryl in subcutaneous tissue,  and 4-0 subcuticular stitch in the skin edges.  Dry dressings were applied.  Sponge and needle counts were recorded as correct at the completion of the  procedure.  The patient tolerated the procedure without obvious  complications and was transferred to the surgical intensive care unit for  further postoperative care.  Total pump time was 79 minutes.  The patient  did not require any blood bank blood products during the operative  procedure.      [place]wa   EG/MEDQ  D:  02/16/2004  T:  02/16/2004  Job:  [id number]   cc:   Nicki Guadalajara, M.D.  [phone] [address], [address]  [place], [place] [address]  Fax: [phone]
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
## 2011-02-02 DIAGNOSIS — Z9289 Personal history of other medical treatment: Secondary | ICD-10-CM

## 2011-02-02 HISTORY — DX: Personal history of other medical treatment: Z92.89

## 2011-03-22 ENCOUNTER — Telehealth: Payer: Self-pay | Admitting: Internal Medicine

## 2011-03-22 NOTE — Telephone Encounter (Signed)
Pt has not been seen since March 2012 and will need an Ov for any prescriptions or treatment. Pt's spouse aware and pt will come for acute ov with TP on 12/20 @ 11am.

## 2011-03-23 ENCOUNTER — Encounter: Payer: Self-pay | Admitting: Adult Health

## 2011-03-23 ENCOUNTER — Ambulatory Visit (INDEPENDENT_AMBULATORY_CARE_PROVIDER_SITE_OTHER): Payer: Medicare HMO | Admitting: Adult Health

## 2011-03-23 ENCOUNTER — Ambulatory Visit (INDEPENDENT_AMBULATORY_CARE_PROVIDER_SITE_OTHER)
Admission: RE | Admit: 2011-03-23 | Discharge: 2011-03-23 | Disposition: A | Discharge status: Home / Self Care | Payer: Medicare HMO | Source: Ambulatory Visit | Attending: Adult Health | Admitting: Adult Health

## 2011-03-23 VITALS — BP 136/74 | HR 69 | Temp 97.4°F | Ht 70.5 in | Wt 214.0 lb

## 2011-03-23 DIAGNOSIS — J45909 Unspecified asthma, uncomplicated: Secondary | ICD-10-CM

## 2011-03-23 MED ORDER — PREDNISONE 10 MG PO TABS: ORAL_TABLET | ORAL | Status: DC

## 2011-03-23 NOTE — Patient Instructions (Signed)
Chlorpheniramine 8mg  At bedtime  For 1 week .  Hard candy during the day to keep from coughing and clearing throat...no cough drops --NO MINTS  Voice rest  Prednisone taper over next week.  Continue Qvar  Delsym 2 tsp every 12hr As needed  Cough.  Please contact office for sooner follow up if symptoms do not improve or worsen or seek emergency care  follow up Dr. Sherene Sires  In 6 weeks and As needed

## 2011-03-24 ENCOUNTER — Encounter: Payer: Self-pay | Admitting: Adult Health

## 2011-03-24 NOTE — Assessment & Plan Note (Signed)
Slow to resolve flare with lingering cough  Plan:  Chlorpheniramine 8mg  At bedtime  For 1 week .  Hard candy during the day to keep from coughing and clearing throat...no cough drops --NO MINTS  Voice rest  Prednisone taper over next week.  Continue Qvar  Delsym 2 tsp every 12hr As needed  Cough.  Please contact office for sooner follow up if symptoms do not improve or worsen or seek emergency care  follow up Dr. Sherene Sires  In 6 weeks and As needed

## 2011-03-24 NOTE — Progress Notes (Signed)
Subjective:    Patient ID: Nathan Howell, male    DOB: [birth year], 76 y.o.   MRN: [id number]  HPI 62 yowm never smoker with tendency to rhinitis/ bronchitis maintained on Azmacort until January 2010 changed to asthmanex and did fine on that until June when changed to Symbicort " never right since" - can't remember whether the problem started before or after the change was made for insurance purposes.   11/26/2009 ov cough and breathing fine on qvar 80 2 puffs first thing in am and 2 puffs again in pm about 12 hours later rec try the 40 2bid and did fine until January when cough returned   05/06/10 seen by Clance with flare of cough  rec chlorpheniramine 8mg  one each day at noon until followup visit here.  prilosec OTC one each am before breakfast until next visit  hard candy during the day to keep from coughing and clearing throat...no cough drops  limit voice use during the day  will treat with a course of prednisone  stay on qvar  cough syrup to take at night only if needed.   May 13, 2010 ov cc cough better on prednisone, mucus is thick slt grey, c/o nasal stuffiness and congestion, breathing ok. cough worse at hs, not using pepcid as rec thinks it might have caused diarrhea in the past  rec pepcid at hs until no cough, increase qvar to 80 and keep workin on inhaler technique  06/24/10 ov much better on present rx ,  No need for rescue, no sign cough.    03/23/11 Acute OV  Complains of cough occ producing clear mucus x42month. Was given omnicef and tussionex by PCP on 12.6.12. He feels better with clear mucus and no fever but cough is not gone.  Feels tickle in throat and aggravating cough.  No chest pain, discolored mucus or fever.    Past Medical History:  Rhinitis Medicamentosa per remote [facility] w/u  Asthma..........................................................................Marland KitchenWert  - HFA 50% March 16, 2009 > 50% May 13, 2010 > 75% .06/24/10 Diabetes  glaucoma    GERD  Hypertension  Coronary Artery Disease  Hyperlipidemia       Review of Systems Constitutional:   No  weight loss, night sweats,  Fevers, chills,  +fatigue, or  lassitude.  HEENT:   No headaches,  Difficulty swallowing,  Tooth/dental problems, or  Sore throat,                No sneezing, itching, ear ache,  +nasal congestion, post nasal drip,   CV:  No chest pain,  Orthopnea, PND, swelling in lower extremities, anasarca, dizziness, palpitations, syncope.   GI  No heartburn, indigestion, abdominal pain, nausea, vomiting, diarrhea, change in bowel habits, loss of appetite, bloody stools.   Resp:   No coughing up of blood.    No chest wall deformity  Skin: no rash or lesions.  GU: no dysuria, change in color of urine, no urgency or frequency.  No flank pain, no hematuria   MS:  No joint pain or swelling.  No decreased range of motion.  No back pain.  Psych:  No change in mood or affect. No depression or anxiety.  No memory loss.         Objective:   Physical Exam Wt 214 November29, 2010 >  > 213 November 26, 2009 > 216 May 13, 2010 > 215 06/24/10>214 03/24/2011  HEENT: nl dentition Oropharanx c/w active mucoid pnds.   Moderate  turbinate edema bilaterally,  no purulent secretions Nl external ear canals without cough reflex  NECK : without JVD/Nodes/TM/ nl carotid upstrokes bilaterally  LUNGS: no acc muscle use, coarse BS w/ no wheeze CV: RRR no s3 or murmur or increase in P2, no edema  ABD: soft and nontender with nl excursion in the supine position. No bruits or organomegaly, bowel sounds nl  MS: warm without deformities, calf tenderness, cyanosis or clubbing       Assessment & Plan:

## 2011-05-08 ENCOUNTER — Encounter: Payer: Self-pay | Admitting: Internal Medicine

## 2011-05-08 ENCOUNTER — Ambulatory Visit (INDEPENDENT_AMBULATORY_CARE_PROVIDER_SITE_OTHER): Payer: Medicare HMO | Admitting: Internal Medicine

## 2011-05-08 DIAGNOSIS — J45909 Unspecified asthma, uncomplicated: Secondary | ICD-10-CM

## 2011-05-08 DIAGNOSIS — R05 Cough: Secondary | ICD-10-CM

## 2011-05-08 DIAGNOSIS — R059 Cough, unspecified: Secondary | ICD-10-CM

## 2011-05-08 NOTE — Patient Instructions (Signed)
Work on maintaining optimal  inhaler technique:  relax and gently blow all the way out then take a nice smooth deep breath back in, triggering the inhaler at same time you start breathing in.  Hold for up to 5 seconds if you can.  Rinse and gargle with water when done   If your mouth or throat starts to bother you,   I suggest you time the inhaler to your dental care and after using the inhaler(s) brush teeth and tongue with a baking soda containing toothpaste and when you rinse this out, gargle with it first to see if this helps your mouth and throat.      If you are satisfied with your treatment plan let your doctor know and he/she can either refill your medications or you can return here when your prescription runs out ( a minimum of once per year if we're doing your refills)    If in any way you are not 100% satisfied,  please tell us.  If 100% better, tell your friends!

## 2011-05-08 NOTE — Progress Notes (Signed)
Subjective:    Patient ID: Nathan Howell, male    DOB: 1934-05-30   MRN: [id number]  Brief patient profile:  68 yowm never smoker with tendency to rhinitis/ bronchitis maintained on Azmacort until January 2010 changed to asthmanex and did fine on that until June when changed to Symbicort " never right since" - can't remember whether the problem started before or after the change which  was made for insurance purposes.   11/26/2009 ov cough and breathing fine on qvar 80 2 puffs first thing in am and 2 puffs again in pm about 12 hours later rec try the 40 2bid and did fine until January when cough returned   05/06/10 seen by Clance with flare of cough  rec chlorpheniramine 8mg  one each day at noon until followup ov prilosec OTC one each am before breakfast until next visit  hard candy during the day to keep from coughing and clearing throat...no cough drops  limit voice use during the day  will treat with a course of prednisone  stay on qvar  cough syrup to take at night only if needed.   May 13, 2010 ov cc cough better on prednisone, mucus is thick slt grey, c/o nasal stuffiness and congestion, breathing ok. cough worse at hs, not using pepcid as rec thinks it might have caused diarrhea in the past   rec pepcid at hs until no cough, increase qvar to 80 and keep working on inhaler technique    03/23/11 Acute OV / NP Complains of cough occ producing clear mucus x13month. Was given omnicef and tussionex by PCP on 12.6.12. He feels better with clear mucus and no fever but cough is not gone.  rec Chlorpheniramine 8mg  At bedtime  For 1 week .  Hard candy during the day to keep from coughing and clearing throat...no cough drops --NO MINTS  Voice rest  Prednisone taper over next week.  Continue Qvar 80 2bid  Delsym 2 tsp every 12hr As needed  Cough  05/08/2011 f/u ov/Cyanne Delmar cc cough resolved, breathing better using saba < once per week, no sob  Sleeping ok without nocturnal  or early am  exacerbation  of respiratory  c/o's or need for noct saba. Also denies any obvious fluctuation of symptoms with weather or environmental changes or other aggravating or alleviating factors except as outlined above   ROS  At present neg for  any significant sore throat, dysphagia, itching, sneezing,  nasal congestion or excess/ purulent secretions,  fever, chills, sweats, unintended wt loss, pleuritic or exertional cp, hempoptysis, orthopnea pnd or leg swelling.  Also denies presyncope, palpitations, heartburn, abdominal pain, nausea, vomiting, diarrhea  or change in bowel or urinary habits, dysuria,hematuria,  rash, arthralgias, visual complaints, headache, numbness weakness or ataxia.       Past Medical History:  Rhinitis Medicamentosa per remote [facility] w/u  Asthma..........................................................................Marland KitchenWert  - HFA 50% March 16, 2009 > 50% May 13, 2010 > 75% .06/24/10 > 90% 05/08/2011  Diabetes  glaucoma  GERD  Hypertension  Coronary Artery Disease  Hyperlipidemia                Objective:   Physical Exam Wt 214 November29, 2010 >  > 213 November 26, 2009 > 216 May 13, 2010 > 219 05/08/2011  HEENT: nl dentition Oropharanx c/w active mucoid pnds.   Moderate  turbinate edema bilaterally, no purulent secretions Nl external ear canals without cough reflex  NECK : without JVD/Nodes/TM/ nl carotid upstrokes bilaterally  LUNGS: no  acc muscle use, coarse BS w/ no wheeze CV: RRR no s3 or murmur or increase in P2, no edema  ABD: soft and nontender with nl excursion in the supine position. No bruits or organomegaly, bowel sounds nl  MS: warm without deformities, calf tenderness, cyanosis or clubbing       Assessment & Plan:

## 2011-05-10 ENCOUNTER — Telehealth: Payer: Self-pay | Admitting: Internal Medicine

## 2011-05-10 NOTE — Assessment & Plan Note (Signed)
The most common causes of chronic cough in immunocompetent adults include the following: upper airway cough syndrome (UACS), previously referred to as postnasal drip syndrome (PNDS), which is caused by variety of rhinosinus conditions; (2) asthma; (3) GERD; (4) chronic bronchitis from cigarette smoking or other inhaled environmental irritants; (5) nonasthmatic eosinophilic bronchitis; and (6) bronchiectasis.   These conditions, singly or in combination, have accounted for up to 94% of the causes of chronic cough in prospective studies.   Other conditions have constituted no >6% of the causes in prospective studies These have included bronchogenic carcinoma, chronic interstitial pneumonia, sarcoidosis, left ventricular failure, ACEI-induced cough, and aspiration from a condition associated with pharyngeal dysfunction.   .Chronic cough is often simultaneously caused by more than one condition. A single cause has been found from 38 to 82% of the time, multiple causes from 18 to 62%. Multiply caused cough has been the result of three diseases up to 42% of the time.     This is most c/w cough variant asthma plus  Classic Upper airway cough syndrome, so named because it's frequently impossible to sort out how much is  CR/sinusitis with freq throat clearing (which can be related to primary GERD)   vs  causing  secondary (" extra esophageal")  GERD from wide swings in gastric pressure that occur with throat clearing, often  promoting self use of mint and menthol lozenges that reduce the lower esophageal sphincter tone and exacerbate the problem further in a cyclical fashion.   These are the same pts who not infrequently have failed to tolerate ace inhibitors,  dry powder inhalers or biphosphonates or report having reflux symptoms that don't respond to standard doses of PPI , and are easily confused as having aecopd or asthma flares, so it's difficult to always know what's asthma vs what's cough due to uacs.  For  now continue gerd rx and prn 1st gen H1 prn pnds

## 2011-05-10 NOTE — Assessment & Plan Note (Signed)
All goals of chronic asthma control met including optimal function and elimination of symptoms with minimal need for rescue therapy.  Contingencies discussed in full including contacting this office immediately if not controlling the symptoms using the rule of two's.    The proper method of use, as well as anticipated side effects, of this metered-dose inhaler are discussed and demonstrated to the patient. Improved to 90% p coaching.    Each maintenance medication was reviewed in detail including most importantly the difference between maintenance and as needed and under what circumstances the prns are to be used.  Please see instructions for details which were reviewed in writing and the patient given a copy.

## 2011-07-07 ENCOUNTER — Other Ambulatory Visit: Payer: Self-pay | Admitting: Internal Medicine

## 2012-05-16 ENCOUNTER — Ambulatory Visit: Payer: Medicare HMO | Admitting: Internal Medicine

## 2012-06-03 ENCOUNTER — Ambulatory Visit: Payer: Medicare HMO | Admitting: Internal Medicine

## 2013-01-25 ENCOUNTER — Encounter (HOSPITAL_BASED_OUTPATIENT_CLINIC_OR_DEPARTMENT_OTHER): Payer: Self-pay | Admitting: Emergency Medicine

## 2013-01-25 ENCOUNTER — Emergency Department (HOSPITAL_BASED_OUTPATIENT_CLINIC_OR_DEPARTMENT_OTHER)
Admission: EM | Admit: 2013-01-25 | Discharge: 2013-01-26 | Disposition: A | Discharge status: Home / Self Care | Payer: Medicare HMO | Attending: Emergency Medicine | Admitting: Emergency Medicine

## 2013-01-25 DIAGNOSIS — IMO0002 Reserved for concepts with insufficient information to code with codable children: Secondary | ICD-10-CM | POA: Insufficient documentation

## 2013-01-25 DIAGNOSIS — Z79899 Other long term (current) drug therapy: Secondary | ICD-10-CM | POA: Insufficient documentation

## 2013-01-25 DIAGNOSIS — J45909 Unspecified asthma, uncomplicated: Secondary | ICD-10-CM | POA: Insufficient documentation

## 2013-01-25 DIAGNOSIS — I10 Essential (primary) hypertension: Secondary | ICD-10-CM | POA: Insufficient documentation

## 2013-01-25 DIAGNOSIS — M7022 Olecranon bursitis, left elbow: Secondary | ICD-10-CM

## 2013-01-25 DIAGNOSIS — E119 Type 2 diabetes mellitus without complications: Secondary | ICD-10-CM | POA: Insufficient documentation

## 2013-01-25 DIAGNOSIS — Z951 Presence of aortocoronary bypass graft: Secondary | ICD-10-CM | POA: Insufficient documentation

## 2013-01-25 DIAGNOSIS — I2510 Atherosclerotic heart disease of native coronary artery without angina pectoris: Secondary | ICD-10-CM | POA: Insufficient documentation

## 2013-01-25 DIAGNOSIS — Z7982 Long term (current) use of aspirin: Secondary | ICD-10-CM | POA: Insufficient documentation

## 2013-01-25 DIAGNOSIS — L03114 Cellulitis of left upper limb: Secondary | ICD-10-CM

## 2013-01-25 DIAGNOSIS — E785 Hyperlipidemia, unspecified: Secondary | ICD-10-CM | POA: Insufficient documentation

## 2013-01-25 DIAGNOSIS — M7150 Other bursitis, not elsewhere classified, unspecified site: Secondary | ICD-10-CM | POA: Insufficient documentation

## 2013-01-25 DIAGNOSIS — Z8719 Personal history of other diseases of the digestive system: Secondary | ICD-10-CM | POA: Insufficient documentation

## 2013-01-25 DIAGNOSIS — H409 Unspecified glaucoma: Secondary | ICD-10-CM | POA: Insufficient documentation

## 2013-01-25 LAB — BASIC METABOLIC PANEL
BUN: 10 mg/dL (ref 6–23)
CO2: 27 mEq/L (ref 19–32)
Calcium: 9.4 mg/dL (ref 8.4–10.5)
Chloride: 92 mEq/L — ABNORMAL LOW (ref 96–112)
Creatinine, Ser: 0.7 mg/dL (ref 0.50–1.35)
GFR calc Af Amer: >90 mL/min (ref >90)
GFR calc non Af Amer: >90 mL/min (ref >90)
Glucose, Bld: 162 mg/dL — ABNORMAL HIGH (ref 70–99)
Potassium: 3.4 mEq/L — ABNORMAL LOW (ref 3.5–5.1)
Sodium: 130 mEq/L — ABNORMAL LOW (ref 135–145)

## 2013-01-25 LAB — CBC WITH DIFFERENTIAL/PLATELET
Basophils Absolute: 0 10*3/uL (ref 0.0–0.1)
Basophils Relative: 0 % (ref 0–1)
Eosinophils Absolute: 0.4 10*3/uL (ref 0.0–0.7)
Eosinophils Relative: 5 % (ref 0–5)
HCT: 37 % — ABNORMAL LOW (ref 39.0–52.0)
Hemoglobin: 13.2 g/dL (ref 13.0–17.0)
Lymphocytes Relative: 25 % (ref 12–46)
Lymphs Abs: 1.9 10*3/uL (ref 0.7–4.0)
MCH: 31.8 pg (ref 26.0–34.0)
MCHC: 35.7 g/dL (ref 30.0–36.0)
MCV: 89.2 fL (ref 78.0–100.0)
Monocytes Absolute: 1 10*3/uL (ref 0.1–1.0)
Monocytes Relative: 13 % — ABNORMAL HIGH (ref 3–12)
Neutro Abs: 4.2 10*3/uL (ref 1.7–7.7)
Neutrophils Relative %: 56 % (ref 43–77)
Platelets: 243 10*3/uL (ref 150–400)
RBC: 4.15 MIL/uL — ABNORMAL LOW (ref 4.22–5.81)
RDW: 12 % (ref 11.5–15.5)
WBC: 7.4 10*3/uL (ref 4.0–10.5)

## 2013-01-25 MED ORDER — CLINDAMYCIN PHOSPHATE 600 MG/50ML IV SOLN
600.0000 mg | Freq: Once | INTRAVENOUS | Status: AC
  Administered 2013-01-25: 600 mg via INTRAVENOUS
  Filled 2013-01-25: qty 50

## 2013-01-25 MED ORDER — VANCOMYCIN HCL IN DEXTROSE 1-5 GM/200ML-% IV SOLN
1000.0000 mg | Freq: Once | INTRAVENOUS | Status: DC
  Filled 2013-01-25: qty 200

## 2013-01-25 MED ORDER — SODIUM CHLORIDE 0.9 % IV BOLUS (SEPSIS)
500.0000 mL | Freq: Once | INTRAVENOUS | Status: AC
  Administered 2013-01-25: 500 mL via INTRAVENOUS

## 2013-01-25 NOTE — ED Notes (Signed)
Pt denies injury or event but reports arm pain and edema with reddness onset Thursday past elbow is warm to touch

## 2013-01-25 NOTE — ED Provider Notes (Signed)
CSN: [id number]     Arrival date & time 01/25/13  1946 History   First MD Initiated Contact with Patient 01/25/13 2207     Chief Complaint  Patient presents with  . Joint Swelling    warm and reddened   (Consider location/radiation/quality/duration/timing/severity/associated sxs/prior Treatment) HPI Patient reports he might have "done something" to his left elbow 4 days ago but is unsure, states it was sore three days ago, started swelling two days ago, and then became red, hot, swollen, and tender yesterday.  Denie any pain in his elbow or difficulty moving his elbow. Denies fevers, chills, body aches, CP, SOB, weakness or numbness of the arm.   Past Medical History  Diagnosis Date  . Asthma     Wert - HFA 50% 03/16/09 > 50% 05/13/10  . Hyperlipidemia   . Hypertension   . Rhinitis medicamentosa     per remote [place] w/u  . DM (diabetes mellitus)   . Glaucoma   . GERD (gastroesophageal reflux disease)   . Coronary artery disease    Past Surgical History  Procedure Laterality Date  . Coronary artery bypass graft      x3  . Knee surgery      bilateral  . Other surgical history      hernia surgery  . Eye surgery      right eye - lens implant  . Hand surgery      right   Family History  Problem Relation Age of Onset  . Asthma Mother   . Emphysema Father   . Colon cancer Neg Hx    History  Substance Use Topics  . Smoking status: Never Smoker   . Smokeless tobacco: Current User    Types: Snuff     Comment: occasional snuff use  . Alcohol Use: No    Review of Systems  Constitutional: Negative for fever and chills.  Respiratory: Negative for shortness of breath.   Cardiovascular: Negative for chest pain.  Musculoskeletal: Negative for arthralgias.  Skin: Positive for color change. Negative for wound.  Neurological: Negative for weakness and numbness.    Allergies  Review of patient's allergies indicates no known allergies.  Home Medications   Current  Outpatient Rx  Name  Route  Sig  Dispense  Refill  . albuterol (PROAIR HFA) 108 (90 BASE) MCG/ACT inhaler   Inhalation   Inhale 2 puffs into the lungs every 4 (four) hours as needed.           [name] aspirin 81 MG tablet   Oral   Take by mouth daily.           . brimonidine (ALPHAGAN) 0.15 % ophthalmic solution   Right Eye   Place 1 drop into the right eye 2 (two) times daily.           . chlorpheniramine (CHLOR-TRIMETON) 4 MG tablet   Oral   Take 8 mg by mouth at bedtime as needed.         . chlorpheniramine-HYDROcodone (TUSSIONEX PENNKINETIC ER) 10-8 MG/5ML LQCR      1 teaspoon at night for cough if needed          . dorzolamide (TRUSOPT) 2 % ophthalmic solution   Right Eye   Place 1 drop into the right eye 2 (two) times daily.           [name] glipiZIDE (GLUCOTROL) 5 MG tablet   Oral   Take by mouth 2 (two) times daily before a meal.           .  hydrochlorothiazide (MICROZIDE) 12.5 MG capsule   Oral   Take 12.5 mg by mouth daily.         . insulin glargine (LANTUS) 100 UNIT/ML injection      Take 26 units at noon          . losartan (COZAAR) 100 MG tablet   Oral   Take by mouth daily.           . metFORMIN (GLUCOPHAGE) 1000 MG tablet   Oral   Take by mouth 2 (two) times daily with a meal.           . metoprolol succinate (TOPROL-XL) 25 MG 24 hr tablet   Oral   Take by mouth daily.           [name] oxymetazoline (AFRIN) 0.05 % nasal spray      Every 12 hours x5days         . EXPIRED: QVAR 80 MCG/ACT inhaler      USE 2 PUFFS FIRST THING IN THE MORNING AND 2 PUFFS IN THE EVENING ABOUT 12 HOURS LATER   1 Inhaler   2   . SALINE NASAL MIST NA   Nasal   2 sprays by Nasal route as needed.           . simvastatin (ZOCOR) 40 MG tablet   Oral   Take by mouth at bedtime.            BP 162/64  Pulse 79  Temp(Src) 99.3 F (37.4 C) (Oral)  Ht 5\' 11"  (1.803 m)  Wt 208 lb (94.348 kg)  BMI 29.02 kg/m2  SpO2 98% Physical Exam  Nursing note  and vitals reviewed. Constitutional: He appears well-developed and well-nourished. No distress.  HENT:  Head: Normocephalic and atraumatic.  Neck: Neck supple.  Pulmonary/Chest: Effort normal.  Musculoskeletal:       Left elbow: He exhibits normal range of motion.  Left elbow with full AROM without pain.  Associated bursa is fluctuant, tender, warmth, with overlying erythema.  Overlying erythema spreads out in all directions from elbow with associated warmth and edema. Very mild tenderness. Distal pulses intact.    Neurological: He is alert.  Skin: He is not diaphoretic.    ED Course  Procedures (including critical care time) Labs Review Labs Reviewed  CBC WITH DIFFERENTIAL - Abnormal; Notable for the following:    RBC 4.15 (*)    HCT 37.0 (*)    Monocytes Relative 13 (*)    All other components within normal limits  BASIC METABOLIC PANEL - Abnormal; Notable for the following:    Sodium 130 (*)    Potassium 3.4 (*)    Chloride 92 (*)    Glucose, Bld 162 (*)    All other components within normal limits  CULTURE, BLOOD (ROUTINE X 2)  CULTURE, BLOOD (ROUTINE X 2)   Imaging Review No results found.  EKG Interpretation   None      10:43 PM Discussed pt with Dr Oletta Lamas who will also see the patient.   Filed Vitals:   01/25/13 2352  BP: 126/57  Pulse:   Temp: 97.8 F (36.6 C)  Resp: 20     MDM   1. Cellulitis of left arm   2. Bursitis of elbow, left    Pt with septic bursitis of left elbow with associated cellulitis.  Pt is afebrile and well appearing, nontoxic.  Dr Oletta Lamas also saw and examined patient, with normal WBC count and normal vitals pt  may be discharged home with PO antibiotics.  I discussed this with patient - pt advised that he should regard this as a trial at home, that he must return to the ED immediately with any worsening of the erythema or if he begins feeling sick or develops a fever.  I drew a line around erythema.  Pt to follow up with PCP Monday  morning.  Strict return precautions.  D/C home with clindamycin.  Discussed results, findings, treatment, and follow up  with patient.  Pt given return precautions.  Pt verbalizes understanding and agrees with plan.      I doubt any other EMC precluding discharge at this time including, but not necessarily limited to the following: septic joint   Trixie Dredge, PA-C 01/26/13 0011

## 2013-01-25 NOTE — ED Provider Notes (Signed)
Left elbow and UE swelling, redness, low grade fevers, likely cellulitis, possibly infected bursa.  Will treat with IV dose of abx as pt is diabetic.  WBC is normal. ROM of elbow preserved, no sig pain, doubt septic arthritis. Good distal strength.  Will refer for close follow up with PCP and pt understands to return if symptoms worsen such as spreading redness, high fevers.    Medical screening examination/treatment/procedure(s) were conducted as a shared visit with non-physician practitioner(s) and myself.  I personally evaluated the patient during the encounter.  EKG Interpretation   None         Gavin Pound. Zevin Nevares, MD 01/25/13 2350

## 2013-01-26 MED ORDER — CLINDAMYCIN HCL 150 MG PO CAPS: 300.0000 mg | ORAL_CAPSULE | Freq: Four times a day (QID) | ORAL | Status: DC

## 2013-01-26 NOTE — ED Provider Notes (Signed)
Medical screening examination/treatment/procedure(s) were conducted as a shared visit with non-physician practitioner(s) and myself.  I personally evaluated the patient during the encounter.  EKG Interpretation   None         Gavin Pound. Raygen Linquist, MD 01/26/13 1506

## 2013-02-01 ENCOUNTER — Encounter: Payer: Self-pay | Admitting: *Deleted

## 2013-02-01 LAB — CULTURE, BLOOD (ROUTINE X 2)
Culture: NO GROWTH
Culture: NO GROWTH

## 2013-02-04 ENCOUNTER — Encounter: Payer: Self-pay | Admitting: Cardiovascular Disease

## 2013-02-04 ENCOUNTER — Ambulatory Visit (INDEPENDENT_AMBULATORY_CARE_PROVIDER_SITE_OTHER): Payer: Medicare HMO | Admitting: Cardiovascular Disease

## 2013-02-04 VITALS — BP 138/78 | HR 67 | Ht 70.0 in | Wt 207.1 lb

## 2013-02-04 DIAGNOSIS — E785 Hyperlipidemia, unspecified: Secondary | ICD-10-CM | POA: Insufficient documentation

## 2013-02-04 DIAGNOSIS — J45909 Unspecified asthma, uncomplicated: Secondary | ICD-10-CM

## 2013-02-04 DIAGNOSIS — E119 Type 2 diabetes mellitus without complications: Secondary | ICD-10-CM

## 2013-02-04 DIAGNOSIS — I10 Essential (primary) hypertension: Secondary | ICD-10-CM | POA: Insufficient documentation

## 2013-02-04 DIAGNOSIS — I2510 Atherosclerotic heart disease of native coronary artery without angina pectoris: Secondary | ICD-10-CM | POA: Insufficient documentation

## 2013-02-04 MED ORDER — SIMVASTATIN 40 MG PO TABS: ORAL_TABLET | ORAL | Status: DC

## 2013-02-04 NOTE — Patient Instructions (Signed)
Your physician has recommended you make the following change in your medication: CUT the simvastatin down to 40 mg daily.  Your physician recommends that you schedule a follow-up appointment in: 1 YEAR.

## 2013-02-04 NOTE — Progress Notes (Signed)
Patient ID: Nathan Howell, male   DOB: [birth year], 78 y.o.   MRN: [id number]     HPI: Nathan Howell is a 78 y.o. male who presents for one-year cardiology evaluation.  Nathan Howell has known coronary artery disease and on 02/24/2004 underwent CABG surgery x3 by Dr. Tyrone Sage with LIMA to the LAD, vein to the intermediate, and into the distal circumflex. [name] was nondominant and not bypassed. Additional problems include type 2 diabetes mellitus, hypertension, as well as hyperlipidemia. His last nuclear perfusion study was done in 2012 which showed significant diaphragmatic attenuation but was without ischemia. Ejection fraction 65%. An echo Doppler study in November 2012 showed moderate concentric LVH with normal systolic function grade 1 diastolic dysfunction. He did have moderate LA dilatation, mild mitral annular calcification trace MR, trace TR, and aortic valve sclerosis.  On the past year, Nathan Howell has continued to be stable from a cardiac standpoint. He denies recent episodes of chest pain. He remains active. He denies tachycardia palpitations.  Recently, he is under treatment for cellulitis of his left elbow. He is being treated with clindamycin and this is ongoing.  Past Medical History  Diagnosis Date  . Asthma     Wert - HFA 50% 03/16/09 > 50% 05/13/10  . Hyperlipidemia   . Hypertension   . Rhinitis medicamentosa     per remote [place] w/u  . DM (diabetes mellitus)   . Glaucoma   . GERD (gastroesophageal reflux disease)   . Coronary artery disease   . History of stress test 02/2011    Which showed mild inferior thinning felt to be diaphragmatic attenuation artifact without ichemia. Poststress EF was 65%.  Nathan Howell Hx of echocardiogram     EF>55% this showed moderate concentric LVH with normal systolic function. There was evidence for grade 1 diastolic relaxation abnormality with possible very small region of apical lateral hypokinesis.He did have moderate left atrial  dilatation by volume assessment. There was mild mitral annular calcification with trace MR, trace TR, mild aortic valve sclerosis without stenosis      Past Surgical History  Procedure Laterality Date  . Coronary artery bypass graft      x3 By Dr Tyrone Sage with LIMA to the LAD, a vein to the intermediate, a vein to the distal circumflex. [name] was nondominant and not bypassed.  . Knee surgery      bilateral  . Other surgical history  1999    hernia surgery  . Eye surgery  2003 2004    right eye - lens implant  . Hand surgery      right  . Cardiac catheterization  02/2004    Normal left ventricular function. Significant multivessel coronary artery disease corresponding to left main equivalent disease with diffuse tubular narrowing of 50% in the proximal left anterior descending, 905 in the ramus intermadius vessel and 80% in the most proximal circumflex veseel with 70% mid A-V groove circumflex stenosis.    No Known Allergies  Current Outpatient Prescriptions  Medication Sig Dispense Refill  . albuterol (PROAIR HFA) 108 (90 BASE) MCG/ACT inhaler Inhale 2 puffs into the lungs every 4 (four) hours as needed.        Nathan Howell aspirin 81 MG tablet Take by mouth daily.        . brimonidine (ALPHAGAN) 0.15 % ophthalmic solution Place 1 drop into the right eye 2 (two) times daily.        . clindamycin (CLEOCIN) 150 MG capsule Take 2 capsules (  300 mg total) by mouth 4 (four) times daily.  56 capsule  0  . dorzolamide (TRUSOPT) 2 % ophthalmic solution Place 1 drop into the right eye 2 (two) times daily.        Nathan Howell glipiZIDE (GLUCOTROL) 5 MG tablet Take by mouth 2 (two) times daily before a meal.        . hydrochlorothiazide (MICROZIDE) 12.5 MG capsule Take 12.5 mg by mouth daily.      . insulin glargine (LANTUS) 100 UNIT/ML injection Take 26 units at noon       . losartan (COZAAR) 100 MG tablet Take by mouth daily.        . metFORMIN (GLUCOPHAGE) 1000 MG tablet Take by mouth 2 (two) times daily with a  meal.        . metoprolol succinate (TOPROL-XL) 25 MG 24 hr tablet Take 25 mg by mouth 2 (two) times daily.       Nathan Howell QVAR 80 MCG/ACT inhaler USE 2 PUFFS FIRST THING IN THE MORNING AND 2 PUFFS IN THE EVENING ABOUT 12 HOURS LATER  1 Inhaler  2  . SALINE NASAL MIST NA 2 sprays by Nasal route as needed.        . simvastatin (ZOCOR) 40 MG tablet Take by mouth at bedtime.        . chlorpheniramine (CHLOR-TRIMETON) 4 MG tablet Take 8 mg by mouth at bedtime as needed.       No current facility-administered medications for this visit.    History   Social History  . Marital Status: Married    Spouse Name: N/A    Number of Children: N/A  . Years of Education: N/A   Occupational History  . Not on file.   Social History Main Topics  . Smoking status: Never Smoker   . Smokeless tobacco: Current User    Types: Snuff     Comment: occasional snuff use  . Alcohol Use: No  . Drug Use: No  . Sexual Activity: Not on file   Other Topics Concern  . Not on file   Social History Narrative   Never smoked cigs regularly but stopped all exposure 17s   Lives with wife    Family History  Problem Relation Age of Onset  . Asthma Mother   . Emphysema Father   . Colon cancer Neg Hx    Social history is normal and that he is married has 3 children 4 grandchildren. He does try to exercise and remains active. There is no tobacco or alcohol use.  ROS is negative for fevers, chills or night sweats. He denies visual symptoms. He denies cough or sputum production. In the past he's been told of having bronchitic symptoms and had been followed by Dr. Sherene Sires. He does note some occasional congestion. He denies presyncope or syncope. Denies PND or orthopnea. He denies recurrent anginal symptoms. There is no nausea vomiting or diarrhea. He denies GU symptoms. He denies myalgias. He denies claudication. He does have diabetes and is on Glucophage, Glucotrol and insulin pen Other comprehensive 12 point system review is  negative.  PE BP 138/78  Pulse 67  Ht 5\' 10"  (1.778 m)  Wt 207 lb 1.6 oz (93.94 kg)  BMI 29.72 kg/m2  General: Alert, oriented, no distress.  Skin: normal turgor, no rashes HEENT: Normocephalic, atraumatic. Pupils round and reactive; sclera anicteric;no lid lag.  Nose without nasal septal hypertrophy Mouth/Parynx benign; Mallinpatti scale 2 Neck: No JVD, no carotid briuts Lungs: clear to  ausculatation and percussion; no wheezing or rales Heart: RRR, s1 s2 normal 1/6 systolic murmur Abdomen: soft, nontender; no hepatosplenomehaly, BS+; abdominal aorta nontender and not dilated by palpation. Pulses 2+ Extremities: no clubbing cyanosis or edema, Homan's sign negative  Neurologic: grossly nonfocal Psychologic: normal affect and mood.  ECG: Sinus rhythm with first-degree AV block. Poor progression anteroseptally. ST-T changes laterally, unchanged  LABS:  BMET    Component Value Date/Time   NA 130* 01/25/2013 2301   K 3.4* 01/25/2013 2301   CL 92* 01/25/2013 2301   CO2 27 01/25/2013 2301   GLUCOSE 162* 01/25/2013 2301   BUN 10 01/25/2013 2301   CREATININE 0.70 01/25/2013 2301   CALCIUM 9.4 01/25/2013 2301   GFRNONAA >90 01/25/2013 2301   GFRAA >90 01/25/2013 2301     Hepatic Function Panel  No results found for this basename: prot, albumin, ast, alt, alkphos, bilitot, bilidir, ibili     CBC    Component Value Date/Time   WBC 7.4 01/25/2013 2301   RBC 4.15* 01/25/2013 2301   HGB 13.2 01/25/2013 2301   HCT 37.0* 01/25/2013 2301   PLT 243 01/25/2013 2301   MCV 89.2 01/25/2013 2301   MCH 31.8 01/25/2013 2301   MCHC 35.7 01/25/2013 2301   RDW 12.0 01/25/2013 2301   LYMPHSABS 1.9 01/25/2013 2301   MONOABS 1.0 01/25/2013 2301   EOSABS 0.4 01/25/2013 2301   BASOSABS 0.0 01/25/2013 2301     BNP No results found for this basename: probnp    Lipid Panel  No results found for this basename: chol, trig, hdl, cholhdl, vldl, ldlcalc     RADIOLOGY: No results  found.    ASSESSMENT AND PLAN:  Mr. Grey Rakestraw is now 9 years status post CBG surgery x3. His last stress test 2 years ago showed mild inferior thinning felt to be due to diaphragmatic attenuation without ischemia. I did review recent laboratory done by Dr. Jacky Kindle. Total cholesterol is 84 triglycerides 79 HDL 39 LDL 29. I suggested he reduce his simvastatin from 40 mg to 20 mg. He tells me he sees Dr. Jacky Kindle at six-month intervals.  He is remaining active without angina symptoms. He has lost approximately 30 pounds since his bypass surgery. He denies tachycardia or palpitations. As long as he remains stable I will see him in one year for followup evaluation the    Lennette Bihari, MD, [facility]  02/04/2013 3:05 PM

## 2013-02-10 ENCOUNTER — Encounter: Payer: Self-pay | Admitting: Cardiovascular Disease

## 2013-03-05 ENCOUNTER — Encounter: Payer: Self-pay | Admitting: Cardiovascular Disease

## 2013-03-11 ENCOUNTER — Encounter: Payer: Self-pay | Admitting: Nurse Practitioner

## 2013-03-11 ENCOUNTER — Ambulatory Visit (INDEPENDENT_AMBULATORY_CARE_PROVIDER_SITE_OTHER): Payer: Medicare (Managed Care) | Admitting: Nurse Practitioner

## 2013-03-11 ENCOUNTER — Encounter (INDEPENDENT_AMBULATORY_CARE_PROVIDER_SITE_OTHER): Payer: Self-pay

## 2013-03-11 VITALS — BP 160/85 | HR 85 | Temp 97.1°F | Ht 70.0 in | Wt 203.0 lb

## 2013-03-11 DIAGNOSIS — J209 Acute bronchitis, unspecified: Secondary | ICD-10-CM

## 2013-03-11 MED ORDER — METHYLPREDNISOLONE ACETATE 80 MG/ML IJ SUSP
80.0000 mg | Freq: Once | INTRAMUSCULAR | Status: AC
Start: 2013-03-11 — End: 2013-03-11
  Administered 2013-03-11: 80 mg via INTRAMUSCULAR

## 2013-03-11 NOTE — Progress Notes (Signed)
   Subjective:    Patient ID: Nathan Howell, male    DOB: 30-Dec-1934, 78 y.o.   MRN: [id number]  HPI patien tin today c/o cough an dcongestion that started over 10 days ago- has been using left over hycodan which has helped some with cough- he had antibiotic left over as well and started it  4 days ago and he feels no better.    Review of Systems  Constitutional: Positive for fatigue. Negative for fever and chills.  HENT: Positive for congestion, postnasal drip and rhinorrhea. Negative for sore throat.   Eyes: Negative.   Respiratory: Positive for cough.   Cardiovascular: Negative.   Gastrointestinal: Negative.   Genitourinary: Negative.   Musculoskeletal: Negative.        Objective:   Physical Exam  Constitutional: He appears well-developed and well-nourished.  HENT:  Right Ear: Hearing, tympanic membrane, external ear and ear canal normal.  Left Ear: Hearing, tympanic membrane, external ear and ear canal normal.  Nose: Mucosal edema and rhinorrhea present. Right sinus exhibits no maxillary sinus tenderness and no frontal sinus tenderness. Left sinus exhibits no maxillary sinus tenderness and no frontal sinus tenderness.  Mouth/Throat: Uvula is midline, oropharynx is clear and moist and mucous membranes are normal.  Pulmonary/Chest: Effort normal and breath sounds normal. He has no wheezes. He has no rales.  Deep cough with scattered ronchi  Skin: Skin is warm.     BP 160/85  Pulse 85  Temp(Src) 97.1 F (36.2 C) (Oral)  Ht 5\' 10"  (1.778 m)  Wt 203 lb (92.08 kg)  BMI 29.13 kg/m2      Assessment & Plan:   1. Acute bronchitis    Meds ordered this encounter  Medications  . methylPREDNISolone acetate (DEPO-MEDROL) injection 80 mg    Sig:    1. Take meds as prescribed 2. Use a cool mist humidifier especially during the winter months and when heat has  been humid. 3. Use saline nose sprays frequently 4. Saline irrigations of the nose can be very helpful if done  frequently.  * 4X daily for 1 week*  * Use of a nettie pot can be helpful with this. Follow directions with this* 5. Drink plenty of fluids 6. Keep thermostat turn down low 7.For any cough or congestion  Use plain Mucinex- regular strength or max strength is fine   * Children- consult with Pharmacist for dosing 8. For fever or aces or pains- take tylenol or ibuprofen appropriate for age and weight.  * for fevers greater than 101 orally you may alternate ibuprofen and tylenol every  3 hours.   Mary-Margaret Daphine Deutscher, FNP

## 2013-03-11 NOTE — Patient Instructions (Signed)

## 2013-04-03 DIAGNOSIS — J449 Chronic obstructive pulmonary disease, unspecified: Secondary | ICD-10-CM

## 2013-04-03 HISTORY — DX: Chronic obstructive pulmonary disease, unspecified: J44.9

## 2013-04-17 ENCOUNTER — Ambulatory Visit (INDEPENDENT_AMBULATORY_CARE_PROVIDER_SITE_OTHER): Payer: Medicare HMO | Admitting: Internal Medicine

## 2013-04-17 ENCOUNTER — Encounter: Payer: Self-pay | Admitting: Internal Medicine

## 2013-04-17 ENCOUNTER — Telehealth: Payer: Self-pay | Admitting: Internal Medicine

## 2013-04-17 ENCOUNTER — Ambulatory Visit (INDEPENDENT_AMBULATORY_CARE_PROVIDER_SITE_OTHER)
Admission: RE | Admit: 2013-04-17 | Discharge: 2013-04-17 | Disposition: A | Discharge status: Home / Self Care | Payer: Medicare HMO | Source: Ambulatory Visit | Attending: Internal Medicine | Admitting: Internal Medicine

## 2013-04-17 VITALS — BP 110/62 | HR 92 | Temp 98.1°F | Ht 70.5 in | Wt 202.0 lb

## 2013-04-17 DIAGNOSIS — R059 Cough, unspecified: Secondary | ICD-10-CM

## 2013-04-17 DIAGNOSIS — R05 Cough: Secondary | ICD-10-CM

## 2013-04-17 DIAGNOSIS — J45991 Cough variant asthma: Secondary | ICD-10-CM

## 2013-04-17 MED ORDER — PREDNISONE 10 MG PO TABS: ORAL_TABLET | ORAL | Status: DC

## 2013-04-17 MED ORDER — RANITIDINE HCL 150 MG PO TABS: 150.0000 mg | ORAL_TABLET | Freq: Two times a day (BID) | ORAL | Status: DC

## 2013-04-17 NOTE — Progress Notes (Signed)
Subjective:    Patient ID: Nathan Howell, male    DOB: 09-05-1934   MRN: [id number]  Brief patient profile:  78yowm never smoker with tendency to rhinitis/ bronchitis maintained on Azmacort until January 2010 changed to asthmanex and did fine on that until June when changed to Symbicort " never right since" - can't remember whether the problem started before or after the change which  was made for insurance purposes.   11/26/2009 ov cough and breathing fine on qvar 80 2 puffs first thing in am and 2 puffs again in pm about 12 hours later rec try the 40 2bid and did fine until January when cough returned   05/06/10 seen by Clance with flare of cough  rec chlorpheniramine 8mg  one each day at noon until followup ov prilosec OTC one each am before breakfast until next visit  hard candy during the day to keep from coughing and clearing throat...no cough drops  limit voice use during the day  will treat with a course of prednisone  stay on qvar  cough syrup to take at night only if needed.   May 13, 2010 ov cc cough better on prednisone, mucus is thick slt grey, c/o nasal stuffiness and congestion, breathing ok. cough worse at hs, not using pepcid as rec thinks it might have caused diarrhea in the past   rec pepcid at hs until no cough, increase qvar to 80 and keep working on inhaler technique    03/23/11 Acute OV / NP Complains of cough occ producing clear mucus x68month. Was given omnicef and tussionex by PCP on 12.6.12. He feels better with clear mucus and no fever but cough is not gone.  rec Chlorpheniramine 8mg  At bedtime  For 1 week .  Hard candy during the day to keep from coughing and clearing throat...no cough drops --NO MINTS  Voice rest  Prednisone taper over next week.  Continue Qvar 80 2bid  Delsym 2 tsp every 12hr As needed  Cough  05/08/2011 f/u ov/Nathan Howell cc cough resolved, breathing better using saba < once per week, no sob rec No change rx   04/17/2013 f/u ov/Nathan Howell re:   Chief Complaint  Patient presents with  . Acute Visit    SOB, chest congestion and coughing x1 month. Mucus is thick and white in color. Once mucus is coughing up he feels better.  was taking the 1st gen h1 no maint  gerd rx at onset with bid qvar which had conrolled cough nicely for almost 2 years.  Most of the cough is at hs but also has it daytime and early in am. Noct events occur abruptly around 1-2 am, "almost like choking" no time to try saba, coughs a couple of times and clears his throat and able to go back to bed, not using daytime saba either.   No obvious day to day or daytime variabilty or assoc chronic cough or cp or chest tightness, subjective wheeze overt sinus or hb symptoms. No unusual exp hx or h/o childhood pna/ asthma or knowledge of premature birth.  Sleeping ok without nocturnal  or early am exacerbation  of respiratory  c/o's or need for noct saba. Also denies any obvious fluctuation of symptoms with weather or environmental changes or other aggravating or alleviating factors except as outlined above   Current Medications, Allergies, Complete Past Medical History, Past Surgical History, Family History, and Social History were reviewed in [employer] record.  ROS  The following are not active  complaints unless bolded sore throat, dysphagia, dental problems, itching, sneezing,  nasal congestion or excess/ purulent secretions, ear ache,   fever, chills, sweats, unintended wt loss, pleuritic or exertional cp, hemoptysis,  orthopnea pnd or leg swelling, presyncope, palpitations, heartburn, abdominal pain, anorexia, nausea, vomiting, diarrhea  or change in bowel or urinary habits, change in stools or urine, dysuria,hematuria,  rash, arthralgias, visual complaints, headache, numbness weakness or ataxia or problems with walking or coordination,  change in mood/affect or memory.            Past Medical History:  Rhinitis Medicamentosa per remote [facility]  w/u  Asthma..........................................................................Marland KitchenWert  - HFA 50% March 16, 2009 > 50% May 13, 2010 > 75% .06/24/10 > 90% 05/08/2011  Diabetes  glaucoma  GERD  Hypertension  Coronary Artery Disease  Hyperlipidemia                Objective:   Physical Exam Wt [place], 2010 >  > 213 November 26, 2009 > 216 May 13, 2010 > 219 05/08/2011 > 202 04/17/2013   In general very anxious wm nad could not fine the basement s nurse's help  HEENT: nl dentition Oropharanx c/w active mucoid pnds.   Moderate  turbinate edema bilaterally, no purulent secretions Nl external ear canals without cough reflex  NECK : without JVD/Nodes/TM/ nl carotid upstrokes bilaterally  LUNGS: no acc muscle use, minimal insp and exp rhonchi, some pseudowheezing  CV: RRR no s3 or murmur or increase in P2, no edema  ABD: soft and nontender with nl excursion in the supine position. No bruits or organomegaly, bowel sounds nl  MS: warm without deformities, calf tenderness, cyanosis or clubbing      CXR  04/17/2013 :   No acute findings    Assessment & Plan:

## 2013-04-17 NOTE — Assessment & Plan Note (Addendum)
DDX of  difficult airways managment all start with A and  include Adherence, Ace Inhibitors, Acid Reflux, Active Sinus Disease, Alpha 1 Antitripsin deficiency, Anxiety masquerading as Airways dz,  ABPA,  allergy(esp in young), Aspiration (esp in elderly), Adverse effects of DPI,  Active smokers, plus two Bs  = Bronchiectasis and Beta blocker use..and one C= CHF  Adherence is always the initial "prime suspect" and is a multilayered concern that requires a "trust but verify" approach in every patient - starting with knowing how to use medications, especially inhalers, correctly, keeping up with refills and understanding the fundamental difference between maintenance and prns vs those medications only taken for a very short course and then stopped and not refilled.  - The proper method of use, as well as anticipated side effects, of a metered-dose inhaler are discussed and demonstrated to the patient. Improved effectiveness after extensive coaching during this visit to a level of approximately  75% so continue qvar 80 2bid  ? Acid (or non-acid) GERD > always difficult to exclude as up to 75% of pts in some series report no assoc GI/ Heartburn symptoms> rec max (24h)  acid suppression and diet restrictions/ reviewed and instructions given in writing.   ? Anxiety  > typically dx of exclusion but higher on his list due to affect demonstrated at ov today  ? Allergy/eos rhinitis > Prednisone 10 mg take  4 each am x 2 days,   2 each am x 2 days,  1 each am x 2 days and stop

## 2013-04-17 NOTE — Telephone Encounter (Signed)
Nathan Howell from [facility] states that they DID receive this Rx so nothing further is needed at this time.  Satira Anis

## 2013-04-17 NOTE — Patient Instructions (Addendum)
Anytime that you experience night time coughing and choking be sure you are taking chlorpheniramine 4 mg at bedtime and Zantac 150 mg at bedtime until the problem resolves for at least a week  Prednisone 10 mg take  4 each am x 2 days,   2 each am x 2 days,  1 each am x 2 days and stop   Qvar 80 Take 2 puffs first thing in am and then another 2 puffs about 12 hours later.   Work on inhaler technique:  relax and gently blow all the way out then take a nice smooth deep breath back in, triggering the inhaler at same time you start breathing in.  Hold for up to 5 seconds if you can.  Rinse and gargle with water when done   Only use your albuterol (proair)  as a rescue medication to be used if you can't catch your breath by resting or doing a relaxed purse lip breathing pattern.  - The less you use it, the better it will work when you need it. - Ok to use up to 2 puffs  every 4 hours if you must but call for immediate appointment if use goes up over your usual need - Don't leave home without it !!  (think of it like the spare tire for your car)   [email or web address] is best bet for bulk generic drugs cash only (will not count against your Medicare D dollars)   If you are satisfied with your treatment plan let your doctor know and he/she can either refill your medications or you can return here when your prescription runs out.     If in any way you are not 100% satisfied,  please tell us.  If 100% better, tell your friends!

## 2013-04-17 NOTE — Telephone Encounter (Signed)
Will sign off of message.

## 2013-09-08 ENCOUNTER — Other Ambulatory Visit: Payer: Self-pay | Admitting: Cardiovascular Disease

## 2013-09-08 NOTE — Telephone Encounter (Signed)
Rx was sent to pharmacy electronically. 

## 2014-01-29 ENCOUNTER — Encounter: Payer: Self-pay | Admitting: Physician Assistant

## 2014-02-04 ENCOUNTER — Encounter: Payer: Self-pay | Admitting: Gastroenterology

## 2014-02-09 ENCOUNTER — Other Ambulatory Visit (INDEPENDENT_AMBULATORY_CARE_PROVIDER_SITE_OTHER): Payer: Medicare HMO

## 2014-02-09 ENCOUNTER — Encounter: Payer: Self-pay | Admitting: Physician Assistant

## 2014-02-09 ENCOUNTER — Ambulatory Visit (INDEPENDENT_AMBULATORY_CARE_PROVIDER_SITE_OTHER): Payer: Medicare HMO | Admitting: Physician Assistant

## 2014-02-09 VITALS — BP 164/84 | HR 84 | Ht 70.0 in | Wt 197.4 lb

## 2014-02-09 DIAGNOSIS — R634 Abnormal weight loss: Secondary | ICD-10-CM

## 2014-02-09 DIAGNOSIS — R197 Diarrhea, unspecified: Secondary | ICD-10-CM

## 2014-02-09 LAB — IGA: IgA: 398 mg/dL — ABNORMAL HIGH (ref 68–378)

## 2014-02-09 MED ORDER — POLYETHYLENE GLYCOL 3350 17 GM/SCOOP PO POWD: 1.0000 | Freq: Every day | ORAL | Status: DC

## 2014-02-09 MED ORDER — METRONIDAZOLE 250 MG PO TABS: 250.0000 mg | ORAL_TABLET | Freq: Three times a day (TID) | ORAL | Status: DC

## 2014-02-09 NOTE — Progress Notes (Signed)
Patient ID: Nathan Howell, male   DOB: [birth year], 79 y.o.   MRN: [id number]    HPI:Mr. Hearn is a 79 year old male referred for evaluation by Dr. Redge Gainer due to a history of diarrhea.  [place] states that he started having diarrhea in mid January he thinks it is due to completing a course of prednisone for his COPD. Since January he has used over-the-counter antidiarrheals, Imodium products, and Pepto-Bismol none of which have worked. He says when he uses the antidiarrheals he will skip a day without having a bowel movement but then has stool incontinence. He reports that he had bursitis and cellulitis in late October and was treated with a course of clindamycin for 10 days .His diarrhea however did not start until January. He has not had any fever or night sweats .He has lost approximately 10 pounds. He states the minute he eats the food runs right through him. He gets no crampy pain but just has immediate diarrhea. He states his stools have been somewhat lighter in color over the past several months and are usually a light tan color. His diarrhea is worse with salads, pasta, and fatty foods. He has had stool tested his primary care providers that he says were negative.  Review of his chart shows that he has had problems with diarrhea in the past. He had a negative colonoscopy elsewhere in 2004. His diarrhea in the past seemed to coincide with PPI therapy and at that time had improved with a course of prednisone and it was felt that he may have had microscopic colitis. Prior to this onset of diarrhea he had not traveled outside of the country. He has not been camping or fishing. He has city water. He has not been in contact with any one else who has ongoing diarrhea.   Past Medical History  Diagnosis Date  . Asthma     [place] - HFA 50% 03/16/09 > 50% 05/13/10  . Hyperlipidemia   . Hypertension   . Rhinitis medicamentosa     per remote [place] w/u  . DM (diabetes mellitus)   . Glaucoma     . GERD (gastroesophageal reflux disease)   . Coronary artery disease   . History of stress test 02/2011    Which showed mild inferior thinning felt to be diaphragmatic attenuation artifact without ichemia. Poststress EF was 65%.  [name] Hx of echocardiogram     EF>55% this showed moderate concentric LVH with normal systolic function. There was evidence for grade 1 diastolic relaxation abnormality with possible very small region of apical lateral hypokinesis.He did have moderate left atrial dilatation by volume assessment. There was mild mitral annular calcification with trace MR, trace TR, mild aortic valve sclerosis without stenosis    . COPD (chronic obstructive pulmonary disease)   . Abdominal hernia     Past Surgical History  Procedure Laterality Date  . Coronary artery bypass graft      x3 By Dr Servando Snare with LIMA to the LAD, a vein to the intermediate, a vein to the distal circumflex. [name] was nondominant and not bypassed.  . Knee surgery      bilateral  . Hernia repair  1999    hernia surgery  . Eye surgery  2003 2004    right eye - lens implant  . Hand surgery      right  . Cardiac catheterization  02/2004    Normal left ventricular function. Significant multivessel coronary artery disease corresponding to left main equivalent  disease with diffuse tubular narrowing of 50% in the proximal left anterior descending, 905 in the ramus intermadius vessel and 80% in the most proximal circumflex veseel with 70% mid A-V groove circumflex stenosis.   Family History  Problem Relation Age of Onset  . Asthma Mother   . Emphysema Father   . Colon cancer Neg Hx    History  Substance Use Topics  . Smoking status: Never Smoker   . Smokeless tobacco: Current User    Types: Snuff     Comment: occasional snuff use  . Alcohol Use: No   Current Outpatient Prescriptions  Medication Sig Dispense Refill  . albuterol (PROAIR HFA) 108 (90 BASE) MCG/ACT inhaler Inhale 2 puffs into the lungs  every 4 (four) hours as needed.      [name] aspirin 81 MG tablet Take by mouth daily.      . brimonidine (ALPHAGAN P) 0.15 % ophthalmic solution 0.15 %.    . brimonidine (ALPHAGAN) 0.15 % ophthalmic solution Place 1 drop into the right eye 2 (two) times daily.      . chlorpheniramine (CHLOR-TRIMETON) 4 MG tablet Take 8 mg by mouth at bedtime as needed.    . dorzolamide (TRUSOPT) 2 % ophthalmic solution Place 1 drop into the right eye 2 (two) times daily.      [name] glipiZIDE (GLUCOTROL) 5 MG tablet Take by mouth 2 (two) times daily before a meal.      . hydrochlorothiazide (MICROZIDE) 12.5 MG capsule TAKE (1) CAPSULE DAILY 90 capsule 1  . insulin detemir (LEVEMIR) 100 UNIT/ML injection Inject 26 Units into the skin at bedtime.    [name] loperamide (IMODIUM) 2 MG capsule Take by mouth as needed for diarrhea or loose stools.    [name] losartan (COZAAR) 100 MG tablet Take by mouth daily.      . metFORMIN (GLUCOPHAGE) 1000 MG tablet Take by mouth 2 (two) times daily with a meal.      . metoprolol succinate (TOPROL-XL) 25 MG 24 hr tablet Take 25 mg by mouth 2 (two) times daily.     [name] QVAR 80 MCG/ACT inhaler USE 2 PUFFS FIRST THING IN THE MORNING AND 2 PUFFS IN THE EVENING ABOUT 12 HOURS LATER 1 Inhaler 2  . SALINE NASAL MIST NA 2 sprays by Nasal route as needed.      . simvastatin (ZOCOR) 40 MG tablet Take 1/2 tablet daily 30 tablet 6  . metroNIDAZOLE (FLAGYL) 250 MG tablet Take 1 tablet (250 mg total) by mouth 3 (three) times daily. 30 tablet 0  . polyethylene glycol powder (GLYCOLAX/MIRALAX) powder Take 255 g by mouth daily. 255 g 0   No current facility-administered medications for this visit.   Allergies  Allergen Reactions  . Zantac  [Ranitidine Hcl] Diarrhea     Review of Systems: Gen: Denies any fever, chills, sweats, anorexia, fatigue, weakness, malaise, and sleep disorder CV: Denies chest pain, angina, palpitations, syncope, orthopnea, PND, peripheral edema, and claudication. Resp: Denies dyspnea at  rest, dyspnea with exercise, cough, sputum, wheezing, coughing up blood, and pleurisy. GI: Denies vomiting blood, jaundice.  Denies dysphagia or odynophagia.Has had diarrhea and some fecal incontinence. GU : Denies urinary burning, blood in urine, urinary frequency, urinary hesitancy, nocturnal urination, and urinary incontinence. MS: Denies joint pain, limitation of movement, and swelling, stiffness, low back pain, extremity pain. Denies muscle weakness, cramps, atrophy.  Derm: Denies rash, itching, dry skin, hives, moles, warts, or unhealing ulcers.  Psych: Denies depression, anxiety, memory loss, suicidal ideation,  hallucinations, paranoia, and confusion. Heme: Denies bruising, bleeding, and enlarged lymph nodes. Neuro:  Denies any headaches, dizziness, paresthesias. Endo:  Denies any problems with DM, thyroid, adrenal function  LAB RESULTS: Stool stool tests on January 05, 2014 were negative for C. Difficile and negative for ova and parasites.  Prior Endoscopies:  Colonoscopy by [facility] in November 2004 was a normal colonoscopy.  Physical Exam: BP 164/84 mmHg  Pulse 84  Ht 5\' 10"  (1.778 m)  Wt 197 lb 6 oz (89.529 kg)  BMI 28.32 kg/m2 Constitutional: Pleasant,well-developed, male in no acute distress. HEENT: Normocephalic and atraumatic. Conjunctivae are normal. No scleral icterus. Neck supple. No thyromegaly Cardiovascular: Normal rate, regular rhythm.  Pulmonary/chest: Effort normal and breath sounds normal. No wheezing, rales or rhonchi. Abdominal: Soft, nondistended, nontender. Bowel sounds active throughout. There are no masses palpable. No hepatomegaly. Extremities: no edema Lymphadenopathy: No cervical adenopathy noted. Neurological: Alert and oriented to person place and time. Skin: Skin is warm and dry. No rashes noted. Psychiatric: Normal mood and affect. Behavior is normal.  ASSESSMENT AND PLAN: 79 year old man with a 10 month history of  diarrhea and a 10 pound weight loss referred for evaluation. A full stool pathogen panel and a stool lactoferrin will be obtained. A TTG and an IgA will be obtained to evaluate for possible celiac. As he has been complaining of increased gas with his diarrhea, he will be given an empiric trial of Flagyl 250 mg 3 times daily for 10 days. He will be scheduled for a colonoscopy to evaluate for polyps, neoplasia, as well as to obtain biopsies to evaluate for possible collagenous/microscopic colitis.The risks, benefits, and alternatives to colonoscopy with possible biopsy and possible polypectomy were discussed with the patient and they consent to proceed. The procedure will be scheduled with Dr Henrene Pastor. Follow-up recommendations will be made pending the findings of his colonoscopy and laboratory tests.    Chazlyn Cude, Vita Barley PA-C 02/09/2014, 3:57 PM

## 2014-02-09 NOTE — Progress Notes (Signed)
Agree with initial assessment and plans 

## 2014-02-09 NOTE — Patient Instructions (Addendum)
Please go to the basement level to have your labs drawn and a stool study. You have been scheduled for a colonoscopy. Please follow written instructions given to you at your visit today.  We have given you a prep for the colonoscopy. If you use inhalers (even only as needed), please bring them with you on the day of your procedure. Your physician has requested that you go to www.[email or web address].com and enter the access code given to you at your visit today. This web site gives a general overview about your procedure. However, you should still follow specific instructions given to you by our office regarding your preparation for the procedure.

## 2014-02-10 ENCOUNTER — Ambulatory Visit (INDEPENDENT_AMBULATORY_CARE_PROVIDER_SITE_OTHER): Payer: Medicare HMO | Admitting: Cardiovascular Disease

## 2014-02-10 ENCOUNTER — Other Ambulatory Visit: Payer: Medicare HMO

## 2014-02-10 ENCOUNTER — Encounter: Payer: Self-pay | Admitting: Cardiovascular Disease

## 2014-02-10 VITALS — BP 164/88 | HR 80 | Ht 70.0 in | Wt 197.0 lb

## 2014-02-10 DIAGNOSIS — R634 Abnormal weight loss: Secondary | ICD-10-CM

## 2014-02-10 DIAGNOSIS — I2510 Atherosclerotic heart disease of native coronary artery without angina pectoris: Secondary | ICD-10-CM

## 2014-02-10 DIAGNOSIS — R197 Diarrhea, unspecified: Secondary | ICD-10-CM

## 2014-02-10 DIAGNOSIS — I10 Essential (primary) hypertension: Secondary | ICD-10-CM

## 2014-02-10 DIAGNOSIS — E785 Hyperlipidemia, unspecified: Secondary | ICD-10-CM

## 2014-02-10 LAB — T-TRANSGLUTAMINASE (TTG) IGG

## 2014-02-10 NOTE — Patient Instructions (Addendum)
Your physician has requested that you have en exercise stress myoview and office appointment in 6 months.

## 2014-02-10 NOTE — Progress Notes (Signed)
Patient ID: Nathan Howell, male   DOB: [birth year], 79 y.o.   MRN: [id number]     HPI: Nathan Howell is a 79 y.o. male who presents for one-year cardiology evaluation.  Nathan Howell has known coronary artery disease and on11/23/2005 underwent CABG surgery x3 by Dr. Servando Snare (LIMA to the LAD, vein to the intermediate, and into the distal circumflex). [name] was nondominant and not bypassed. Additional problems include type 2 diabetes mellitus, hypertension, as well as hyperlipidemia. His last nuclear perfusion study in 2012 which showed significant diaphragmatic attenuation but was without ischemia. Ejection fraction 65%. An echo Doppler study in November 2012 showed moderate concentric LVH with normal systolic function grade 1 diastolic dysfunction. He did have moderate LA dilatation, mild mitral annular calcification trace MR, trace TR, and aortic valve sclerosis.  On the past year, [name] has continued to be stable from a cardiac standpoint. He denies recent episodes of chest pain. He remains active. He denies tachycardia palpitations. He denies presyncope or syncope.  He suddenly, he has had issues with diarrhea since January.  He will be undergoing colonoscopy tomorrow with Dr. Henrene Pastor.  He has lost 10 pounds since he was last seen.  Past Medical History  Diagnosis Date  . Asthma     [place]ert - HFA 50% 03/16/09 > 50% 05/13/10  . Hyperlipidemia   . Hypertension   . Rhinitis medicamentosa     per remote [place] w/u  . DM (diabetes mellitus)   . Glaucoma   . GERD (gastroesophageal reflux disease)   . Coronary artery disease   . History of stress test 02/2011    Which showed mild inferior thinning felt to be diaphragmatic attenuation artifact without ichemia. Poststress EF was 65%.  [name] Hx of echocardiogram     EF>55% this showed moderate concentric LVH with normal systolic function. There was evidence for grade 1 diastolic relaxation abnormality with possible very small region of apical  lateral hypokinesis.He did have moderate left atrial dilatation by volume assessment. There was mild mitral annular calcification with trace MR, trace TR, mild aortic valve sclerosis without stenosis    . COPD (chronic obstructive pulmonary disease)   . Abdominal hernia     Past Surgical History  Procedure Laterality Date  . Coronary artery bypass graft      x3 By Dr Servando Snare with LIMA to the LAD, a vein to the intermediate, a vein to the distal circumflex. [name] was nondominant and not bypassed.  . Knee surgery      bilateral  . Hernia repair  1999    hernia surgery  . Eye surgery  2003 2004    right eye - lens implant  . Hand surgery      right  . Cardiac catheterization  02/2004    Normal left ventricular function. Significant multivessel coronary artery disease corresponding to left main equivalent disease with diffuse tubular narrowing of 50% in the proximal left anterior descending, 905 in the ramus intermadius vessel and 80% in the most proximal circumflex veseel with 70% mid A-V groove circumflex stenosis.    No Active Allergies  Current Outpatient Prescriptions  Medication Sig Dispense Refill  . ALPHAGAN P 0.1 % SOLN Place 1 drop into the right eye 2 (two) times daily.    [name] aspirin 81 MG tablet Take 1 tablet by mouth daily.    . dorzolamide (TRUSOPT) 2 % ophthalmic solution Place 1 drop into the right eye 2 (two) times daily.      [name]  glipiZIDE (GLUCOTROL) 5 MG tablet Take by mouth 2 (two) times daily before a meal.      . hydrochlorothiazide (MICROZIDE) 12.5 MG capsule TAKE (1) CAPSULE DAILY 90 capsule 1  . LEVEMIR FLEXTOUCH 100 UNIT/ML Pen Inject 26 Units into the skin daily. Or as directed    . losartan (COZAAR) 100 MG tablet Take by mouth daily.      . metFORMIN (GLUCOPHAGE) 1000 MG tablet Take by mouth 2 (two) times daily with a meal.      . metoprolol succinate (TOPROL-XL) 25 MG 24 hr tablet Take 25 mg by mouth 2 (two) times daily.     [name] QVAR 80 MCG/ACT inhaler USE 2  PUFFS FIRST THING IN THE MORNING AND 2 PUFFS IN THE EVENING ABOUT 12 HOURS LATER 1 Inhaler 2  . SALINE NASAL MIST NA 2 sprays by Nasal route as needed.      . simvastatin (ZOCOR) 20 MG tablet Take 1 tablet by mouth daily.    . chlorpheniramine (CHLOR-TRIMETON) 4 MG tablet Take 8 mg by mouth at bedtime as needed.    . loperamide (IMODIUM) 2 MG capsule Take by mouth as needed for diarrhea or loose stools.    . polyethylene glycol powder (GLYCOLAX/MIRALAX) powder Take 255 g by mouth daily. 255 g 0   No current facility-administered medications for this visit.    History   Social History  . Marital Status: Married    Spouse Name: N/A    Number of Children: 3  . Years of Education: N/A   Occupational History  . Retired    Social History Main Topics  . Smoking status: Never Smoker   . Smokeless tobacco: Current User    Types: Snuff     Comment: occasional snuff use  . Alcohol Use: No  . Drug Use: No  . Sexual Activity: Not on file   Other Topics Concern  . Not on file   Social History Narrative   Never smoked cigs regularly but stopped all exposure 21s   Lives with wife    Family History  Problem Relation Age of Onset  . Asthma Mother   . Emphysema Father   . Colon cancer Neg Hx    Social history is normal and that he is married has 3 children 4 grandchildren. He does try to exercise and remains active. There is no tobacco or alcohol use.  ROS General: Negative; No fevers, chills, or night sweats;  HEENT: Negative; No changes in vision or hearing, sinus congestion, difficulty swallowing Pulmonary: Negative; No cough, wheezing, shortness of breath, hemoptysis Cardiovascular: Negative; No chest pain, presyncope, syncope, palpitations GI: Negative; No nausea, vomiting, diarrhea, or abdominal pain GU: Negative; No dysuria, hematuria, or difficulty voiding Musculoskeletal: Negative; no myalgias, joint pain, or weakness Hematologic/Oncology: Negative; no easy bruising,  bleeding Endocrine: Negative; no heat/cold intolerance; no diabetes Neuro: Negative; no changes in balance, headaches Skin: Negative; No rashes or skin lesions Psychiatric: Negative; No behavioral problems, depression Sleep: Negative; No snoring, daytime sleepiness, hypersomnolence, bruxism, restless legs, hypnogognic hallucinations, no cataplexy Other comprehensive 14 point system review is negative. ROS is negative for fevers, chills or night sweats. He denies visual symptoms. He denies cough or sputum production. In the past he's been told of having bronchitic symptoms and had been followed by Dr. Melvyn Novas. He does note some occasional congestion. He denies presyncope or syncope. Denies PND or orthopnea. He denies recurrent anginal symptoms. There is no nausea vomiting or diarrhea. He denies GU symptoms. He  denies myalgias. He denies claudication. He does have diabetes and is on Glucophage, Glucotrol and insulin pen Other comprehensive 12 point system review is negative.  PE BP 164/88 and repeat by me 180/84.  Pulse 80.  Height 510, weight 197; body mass index 28.27 kg/meter squared General: Alert, oriented, no distress.  Skin: normal turgor, no rashes HEENT: Normocephalic, atraumatic. Pupils round and reactive; sclera anicteric;no lid lag.  Nose without nasal septal hypertrophy Mouth/Parynx benign; Mallinpatti scale 2 Neck: No JVD, no carotid bruits with normal carotid upstroke Lungs: clear to ausculatation and percussion; no wheezing or rales Chest wall: Nontender to palpation Heart: RRR, s1 s2 normal 1/6 systolic murmur; .  No diastolic murmur.  No rubs thrills or heaves Abdomen: soft, nontender; no hepatosplenomehaly, BS+; abdominal aorta nontender and not dilated by palpation. Pulses 2+ Extremities: no clubbing cyanosis or edema, Homan's sign negative  Neurologic: grossly nonfocal Psychologic: normal affect and mood.  ECG (independently read by me): Sinus rhythm with first-degree AV  block with a heart rate at 80 bpm.  PR interval 228 ms.  T-wave changes in leads 1 and aVL.  Poor anterior R-wave progression.  Prior November 2014 ECG: Sinus rhythm with first-degree AV block. Poor progression anteroseptally. ST-T changes laterally, unchanged  LABS:  BMET    Component Value Date/Time   NA 130* 01/25/2013 2301   K 3.4* 01/25/2013 2301   CL 92* 01/25/2013 2301   CO2 27 01/25/2013 2301   GLUCOSE 162* 01/25/2013 2301   BUN 10 01/25/2013 2301   CREATININE 0.70 01/25/2013 2301   CALCIUM 9.4 01/25/2013 2301   GFRNONAA >90 01/25/2013 2301   GFRAA >90 01/25/2013 2301     Hepatic Function Panel  No results found for: PROT   CBC    Component Value Date/Time   WBC 7.4 01/25/2013 2301   RBC 4.15* 01/25/2013 2301   HGB 13.2 01/25/2013 2301   HCT 37.0* 01/25/2013 2301   PLT 243 01/25/2013 2301   MCV 89.2 01/25/2013 2301   MCH 31.8 01/25/2013 2301   MCHC 35.7 01/25/2013 2301   RDW 12.0 01/25/2013 2301   LYMPHSABS 1.9 01/25/2013 2301   MONOABS 1.0 01/25/2013 2301   EOSABS 0.4 01/25/2013 2301   BASOSABS 0.0 01/25/2013 2301     BNP No results found for: PROBNP  Lipid Panel  No results found for: CHOL   RADIOLOGY: No results found.    ASSESSMENT AND PLAN:  Mr. Matei Magnone is 10 years status post CABG surgery x3. His last stress test 3 years ago showed mild inferior thinning felt to be due to diaphragmatic attenuation without ischemia. Presently, he denies recurrent anginal symptoms.  He denies any change in exercise tolerance or development of exertional shortness of breath.  He recently had laboratory by Dr. Reynaldo Minium in which his total cholesterol was 121, LDL 69, HDL 44, triglycerides 72.  He had normal LFTs andrenal function.  Hemoglobin A1c was 5.8.  His blood pressure today when taken by me was elevated as well as by the nurse.  However, when the patient was seen at Dr. Jacquiline Doe office several weeks ago his blood pressure was 136/64.  The patient had  not taken his blood pressure medications today, which consist of losartan 100 mg, HCTZ 12.5 mg as well as Toprol-XL 25 mg twice a day.  Since he is not taking his medication and he is undergoing bowel prep for his colonoscopy tomorrow.  I will not further titrate his medicines presently.  However, his blood pressure  will need to be monitored closely and if this remains elevated additional treatment will be necessary. I will see him in 6 months for follow-up evaluation and prior to that office visit.  He will undergo a four-year follow-up nuclear perfusion study to reassess his cardiologist disease which would be 11 years since his bypass surgery.  I will see him in the office in follow-up and further recommendations will be made at that time.   Troy Sine, MD, [facility]  02/12/2014 2:46 PM

## 2014-02-11 ENCOUNTER — Encounter: Payer: Self-pay | Admitting: Internal Medicine

## 2014-02-11 ENCOUNTER — Ambulatory Visit (AMBULATORY_SURGERY_CENTER): Payer: Medicare HMO | Admitting: Internal Medicine

## 2014-02-11 VITALS — BP 131/79 | HR 52 | Temp 97.4°F | Resp 24 | Ht 70.0 in | Wt 197.0 lb

## 2014-02-11 DIAGNOSIS — K5289 Other specified noninfective gastroenteritis and colitis: Secondary | ICD-10-CM

## 2014-02-11 DIAGNOSIS — R197 Diarrhea, unspecified: Secondary | ICD-10-CM

## 2014-02-11 LAB — GASTROINTESTINAL PATHOGEN PANEL PCR
C. difficile Tox A/B, PCR: NEGATIVE
Campylobacter, PCR: NEGATIVE
Cryptosporidium, PCR: NEGATIVE
E COLI (STEC) STX1/STX2, PCR: NEGATIVE
E coli (ETEC) LT/ST PCR: NEGATIVE
E coli 0157, PCR: NEGATIVE
Giardia lamblia, PCR: NEGATIVE
NOROVIRUS, PCR: NEGATIVE
ROTAVIRUS, PCR: NEGATIVE
SALMONELLA, PCR: NEGATIVE
Shigella, PCR: NEGATIVE

## 2014-02-11 LAB — GLUCOSE, CAPILLARY
GLUCOSE-CAPILLARY: 161 mg/dL — AB (ref 70–99)
Glucose-Capillary: 248 mg/dL — ABNORMAL HIGH (ref 70–99)

## 2014-02-11 LAB — FECAL LACTOFERRIN, QUANT: Lactoferrin: POSITIVE

## 2014-02-11 MED ORDER — SODIUM CHLORIDE 0.9 % IV SOLN: 500.0000 mL | INTRAVENOUS | Status: DC

## 2014-02-11 NOTE — Patient Instructions (Signed)

## 2014-02-11 NOTE — Progress Notes (Signed)
Called to room to assist during endoscopic procedure.  Patient ID and intended procedure confirmed with present staff. Received instructions for my participation in the procedure from the performing physician.  

## 2014-02-11 NOTE — Progress Notes (Signed)
Awake,alert and oriented x3, Pleased with MAC. Report to RN

## 2014-02-11 NOTE — Op Note (Signed)
[place]  [address] [place], [address]   COLONOSCOPY PROCEDURE REPORT  PATIENT: Nathan Howell, Nathan Howell  MR#: [id number] BIRTHDATE: 12-12-1934 , 78  yrs. old GENDER: male ENDOSCOPIST: Eustace Quail, MD REFERRED FM:[id number] Reynaldo Minium, M.D. PROCEDURE DATE:  02/11/2014 PROCEDURE:   Colonoscopy with biopsy First Screening Colonoscopy - Avg.  risk and is 50 yrs.  old or older - No.  Prior Negative Screening - Now for repeat screening. N/A  History of Adenoma - Now for follow-up colonoscopy & has been > or = to 3 yrs.  N/A  Polyps Removed Today? No.  Recommend repeat exam, <10 yrs? No. ASA CLASS:   Class II INDICATIONS:unexplained diarrhea.   . Normal screening colonoscopy in [place] in 2004 MEDICATIONS: Monitored anesthesia care and Propofol 200 mg IV  DESCRIPTION OF PROCEDURE:   After the risks benefits and alternatives of the procedure were thoroughly explained, informed consent was obtained.  The digital rectal exam revealed no abnormalities of the rectum.   The [id number]  endoscope was introduced through the anus and advanced to the cecum, which was identified by both the appendix and ileocecal valve. No adverse events experienced.   The quality of the prep was excellent, using MoviPrep  The instrument was then slowly withdrawn as the colon was fully examined.      COLON FINDINGS: There was moderate diverticulosis noted throughout the entire examined colon.   The examination was otherwise normal. Retroflexed views revealed internal hemorrhoids. The time to cecum=3 minutes 24 seconds.  Withdrawal time=12 minutes 03 seconds. The scope was withdrawn and the procedure completed.  COMPLICATIONS: There were no immediate complications.  ENDOSCOPIC IMPRESSION: 1.   Moderate diverticulosis was noted throughout the entire examined colon 2.   The examination was otherwise normal  RECOMMENDATIONS: 1.  Await biopsy results 2.  Please complete  recently prescribed metronidazole (Flagyl) prescription. 3. The need for follow-up to be determined based on clinical response and biopsy results.  eSigned:  Eustace Quail, MD 02/11/2014 2:14 PM   cc: Burnard Bunting, MD and The Patient

## 2014-02-12 ENCOUNTER — Telehealth: Payer: Self-pay | Admitting: *Deleted

## 2014-02-12 ENCOUNTER — Encounter: Payer: Self-pay | Admitting: Cardiovascular Disease

## 2014-02-12 NOTE — Telephone Encounter (Signed)
  Follow up Call-  Call back number 02/11/2014  Post procedure Call Back phone  # [phone]  Permission to leave phone message Yes     Patient questions:  Do you have a fever, pain , or abdominal swelling? No. Pain Score  0 *  Have you tolerated food without any problems? Yes.    Have you been able to return to your normal activities? Yes.    Do you have any questions about your discharge instructions: Diet   No. Medications  No. Follow up visit  No.  Do you have questions or concerns about your Care? No.  Actions: * If pain score is 4 or above: No action needed, pain <4.

## 2014-02-18 ENCOUNTER — Encounter: Payer: Self-pay | Admitting: Internal Medicine

## 2014-02-18 ENCOUNTER — Telehealth: Payer: Self-pay

## 2014-02-18 MED ORDER — BUDESONIDE 9 MG PO TB24: 9.0000 mg | ORAL_TABLET | Freq: Every day | ORAL | Status: DC

## 2014-02-18 NOTE — Telephone Encounter (Signed)
Left message for pt to call back.  Pt aware, script sent to the pharmacy. Pt scheduled for OV 04/14/14@3 :45pm. Pt aware of appt.

## 2014-02-18 NOTE — Telephone Encounter (Signed)
-----   Message from Irene Shipper, MD sent at 02/18/2014  1:11 PM EST ----- Regarding: microscopic colitis Vaughan Basta, Please let the patient know that his biopsy showed microscopic colitis. This may explain his diarrhea. Please begin budesonide 9 mg daily. He should see me for follow-up in about 6 weeks. He should stay on the medication until he sees me. I have also sent him a letter. Please convert to phone note, as always. Thank you Dr. Henrene Pastor

## 2014-03-06 ENCOUNTER — Other Ambulatory Visit: Payer: Self-pay | Admitting: Cardiovascular Disease

## 2014-03-06 NOTE — Telephone Encounter (Signed)
Rx was sent to pharmacy electronically. 

## 2014-03-30 ENCOUNTER — Telehealth: Payer: Self-pay | Admitting: Internal Medicine

## 2014-03-30 NOTE — Telephone Encounter (Signed)
The patient has been paying $600 for the budesonide prescription. He is to stay on the medication for 6 weeks to treat microscopic colitis. He has enough medication for about 2 weeks. Appointment moved to 04/08/14 to discuss changing or discontinuing the medication.

## 2014-04-08 ENCOUNTER — Encounter: Payer: Self-pay | Admitting: Internal Medicine

## 2014-04-08 ENCOUNTER — Ambulatory Visit (INDEPENDENT_AMBULATORY_CARE_PROVIDER_SITE_OTHER): Payer: Medicare HMO | Admitting: Internal Medicine

## 2014-04-08 VITALS — BP 142/82 | HR 68 | Ht 70.0 in | Wt 200.0 lb

## 2014-04-08 DIAGNOSIS — R197 Diarrhea, unspecified: Secondary | ICD-10-CM

## 2014-04-08 DIAGNOSIS — K5289 Other specified noninfective gastroenteritis and colitis: Secondary | ICD-10-CM

## 2014-04-08 DIAGNOSIS — R634 Abnormal weight loss: Secondary | ICD-10-CM

## 2014-04-08 DIAGNOSIS — K52831 Collagenous colitis: Secondary | ICD-10-CM

## 2014-04-08 NOTE — Patient Instructions (Signed)
Per Dr. Henrene Pastor, discontinue your Budesonide.  Please follow up with Dr. Henrene Pastor in 6 months or sooner if you have any problems.

## 2014-04-08 NOTE — Progress Notes (Signed)
HISTORY OF PRESENT ILLNESS:  Nathan Howell is a 80 y.o. male who was initially evaluated by the advance practitioner 02/09/2014 regarding 11 months worth of diarrhea. See that dictation. Multiple stool studies elsewhere were negative. Celiac panel was negative. Empiric trial of metronidazole not helpful. He subsequently underwent complete colonoscopy 02/11/2014. He was found to have moderate diverticulosis. Otherwise normal. Multiple random colon biopsies were obtained and revealed COLLAGENOUS COLITIS. He was placed on budesonide 9 mg daily. He presents today for follow-up. The patient reports that within 1 week of initiating medication, his diarrhea stopped. Currently having normal formed bowel movement daily. He has 2 weeks remaining on his second refill of medication. Patient has great concerns over medication cost ($600 a month). No other active issues. GI review of systems today is negative. Finally, he had been losing weight. However, since his last visit he has gained several pounds and is pleased  REVIEW OF SYSTEMS:  All non-GI ROS negative except for hearing impairment  Past Medical History  Diagnosis Date  . Asthma     [place] - HFA 50% 03/16/09 > 50% 05/13/10  . Hyperlipidemia   . Hypertension   . Rhinitis medicamentosa     per remote [place] w/u  . DM (diabetes mellitus)   . Glaucoma   . GERD (gastroesophageal reflux disease)   . Coronary artery disease   . History of stress test 02/2011    Which showed mild inferior thinning felt to be diaphragmatic attenuation artifact without ichemia. Poststress EF was 65%.  [name] Hx of echocardiogram     EF>55% this showed moderate concentric LVH with normal systolic function. There was evidence for grade 1 diastolic relaxation abnormality with possible very small region of apical lateral hypokinesis.He did have moderate left atrial dilatation by volume assessment. There was mild mitral annular calcification with trace MR, trace TR, mild aortic  valve sclerosis without stenosis    . COPD (chronic obstructive pulmonary disease)   . Abdominal hernia   . Diverticulosis   . Collagenous colitis     Past Surgical History  Procedure Laterality Date  . Coronary artery bypass graft      x3 By Dr Servando Snare with LIMA to the LAD, a vein to the intermediate, a vein to the distal circumflex. [name] was nondominant and not bypassed.  . Knee surgery      bilateral  . Hernia repair  1999    hernia surgery  . Eye surgery  2003 2004    right eye - lens implant  . Hand surgery      right  . Cardiac catheterization  02/2004    Normal left ventricular function. Significant multivessel coronary artery disease corresponding to left main equivalent disease with diffuse tubular narrowing of 50% in the proximal left anterior descending, 905 in the ramus intermadius vessel and 80% in the most proximal circumflex veseel with 70% mid A-V groove circumflex stenosis.    Social History Nathan Howell  reports that he has never smoked. His smokeless tobacco use includes Snuff. He reports that he does not drink alcohol or use illicit drugs.  family history includes Asthma in his mother; Emphysema in his father. There is no history of Colon cancer.  No Active Allergies     PHYSICAL EXAMINATION: Vital signs: BP 142/82 mmHg  Pulse 68  Ht 5\' 10"  (1.778 m)  Wt 200 lb (90.719 kg)  BMI 28.70 kg/m2 General: Well-developed, well-nourished, no acute distress HEENT: Sclerae are anicteric, conjunctiva pink. Oral mucosa  intact Lungs: Clear Heart: Regular Abdomen: soft, nontender, nondistended, no obvious ascites, no peritoneal signs, normal bowel sounds. No organomegaly. Extremities: No edema Psychiatric: alert and oriented x3. Cooperative     ASSESSMENT:  #1. Collagenous colitis. Diarrhea resolved on budesonide. Gaining weight. The patient was extensively counseled regarding his diagnosis, treatment strategies, and long-term outcomes including  recurrence rates. We also discussed multiple medication options used to treat deconditioned and they're expected effectiveness   PLAN:  #1. Stop budesonide. If diarrhea returns, resume budesonide. He will contact the office if this is the case. Due to prohibitive cost of drug, would prescribe Lomotil at that time. #2. GI follow-up in 6 months. Sooner if needed

## 2014-04-14 ENCOUNTER — Ambulatory Visit: Payer: Medicare HMO | Admitting: Internal Medicine

## 2014-05-25 ENCOUNTER — Other Ambulatory Visit: Payer: Self-pay

## 2014-05-25 ENCOUNTER — Telehealth: Payer: Self-pay | Admitting: Internal Medicine

## 2014-05-25 MED ORDER — DIPHENOXYLATE-ATROPINE 2.5-0.025 MG PO TABS: ORAL_TABLET | ORAL | Status: DC

## 2014-05-25 NOTE — Telephone Encounter (Signed)
Script sent to pharmacy.

## 2014-05-25 NOTE — Telephone Encounter (Signed)
Spoke with pt and he is aware there is no alternative. Pt states he cannot afford this at all. Pts last OV note mentioned lomotil. Pt wants to know if this could be sent in for him. Please advise.

## 2014-05-25 NOTE — Telephone Encounter (Signed)
There is no compatible alternative that I'm aware of. You? I favor resuming budesonide 9 mg daily. Possibly we could taper down to lower dose over time which may be less expensive

## 2014-05-25 NOTE — Telephone Encounter (Signed)
Sure. He can try one to 2 Lomotil every 4-6 hours as needed

## 2014-05-25 NOTE — Telephone Encounter (Signed)
Pt called and states his diarrhea is back. States he took Uceris but he cannot afford this. States he was told that if his diarrhea came back we could call in something less expensive. Please advise.

## 2014-08-10 ENCOUNTER — Ambulatory Visit (INDEPENDENT_AMBULATORY_CARE_PROVIDER_SITE_OTHER): Payer: Medicare HMO | Admitting: Cardiovascular Disease

## 2014-08-10 ENCOUNTER — Encounter: Payer: Self-pay | Admitting: Cardiovascular Disease

## 2014-08-10 VITALS — BP 168/80 | HR 72 | Ht 70.0 in | Wt 199.6 lb

## 2014-08-10 DIAGNOSIS — E1059 Type 1 diabetes mellitus with other circulatory complications: Secondary | ICD-10-CM | POA: Diagnosis not present

## 2014-08-10 DIAGNOSIS — I10 Essential (primary) hypertension: Secondary | ICD-10-CM

## 2014-08-10 DIAGNOSIS — E785 Hyperlipidemia, unspecified: Secondary | ICD-10-CM

## 2014-08-10 DIAGNOSIS — I2510 Atherosclerotic heart disease of native coronary artery without angina pectoris: Secondary | ICD-10-CM | POA: Diagnosis not present

## 2014-08-10 MED ORDER — METOPROLOL TARTRATE 25 MG PO TABS: 25.0000 mg | ORAL_TABLET | Freq: Two times a day (BID) | ORAL | Status: DC

## 2014-08-10 NOTE — Patient Instructions (Signed)
Your physician has recommended you make the following change in your medication: increase the lopressor ( metoprolol) to 1 tablet twice a day.   Your physician wants you to follow-up in: 6 months or sooner if needed. You will receive a reminder letter in the mail two months in advance. If you don't receive a letter, please call our office to schedule the follow-up appointment.

## 2014-08-11 ENCOUNTER — Encounter: Payer: Self-pay | Admitting: Cardiovascular Disease

## 2014-08-11 NOTE — Progress Notes (Signed)
Patient ID: Nathan Howell, male   DOB: [birth year], 80 y.o.   MRN: [id number]     HPI: Nathan Howell is a 80 y.o. male who presents for a 7 month cardiology evaluation.  Nathan Howell has known CAD and on11/23/2005 underwent CABG surgery x3 by Dr. Servando Snare (LIMA to the LAD, vein to the intermediate, and into the distal circumflex). [name] was nondominant and not bypassed. Additional problems include type 2 diabetes mellitus, hypertension, as well as hyperlipidemia. His last nuclear perfusion study in 2012 which showed significant diaphragmatic attenuation but was without ischemia. Ejection fraction 65%. An echo Doppler study in November 2012 showed moderate concentric LVH with normal systolic function grade 1 diastolic dysfunction. He did have moderate LA dilatation, mild mitral annular calcification trace MR, trace TR, and aortic valve sclerosis.  On the past year, Nathan Howell has continued to be stable from a cardiac standpoint. He denies recent episodes of chest pain. He remains active. He denies tachycardia palpitations. He denies presyncope or syncope.  When I had last seen him, his blood pressure was elevated, but he had not taken his medication.  He simply he has been taking HCTZ 12.5 mg, losartan 100 mg, and metoprolol, tartrate 25 mg but only taking this daily instead of twice a day.  He has been on simvastatin 20 mg for hyperlipidemia.    He had undergone colonoscopy by Dr. Henrene Pastor and was found to have having diverticulitis.  He states his significant diarrhea markedly improved with a significant change in his diet.   Past Medical History  Diagnosis Date  . Asthma     [place]ert - HFA 50% 03/16/09 > 50% 05/13/10  . Hyperlipidemia   . Hypertension   . Rhinitis medicamentosa     per remote [place] w/u  . DM (diabetes mellitus)   . Glaucoma   . GERD (gastroesophageal reflux disease)   . Coronary artery disease   . History of stress test 02/2011    Which showed mild inferior thinning  felt to be diaphragmatic attenuation artifact without ichemia. Poststress EF was 65%.  [name] Hx of echocardiogram     EF>55% this showed moderate concentric LVH with normal systolic function. There was evidence for grade 1 diastolic relaxation abnormality with possible very small region of apical lateral hypokinesis.He did have moderate left atrial dilatation by volume assessment. There was mild mitral annular calcification with trace MR, trace TR, mild aortic valve sclerosis without stenosis    . COPD (chronic obstructive pulmonary disease)   . Abdominal hernia   . Diverticulosis   . Collagenous colitis     Past Surgical History  Procedure Laterality Date  . Coronary artery bypass graft      x3 By Dr Servando Snare with LIMA to the LAD, a vein to the intermediate, a vein to the distal circumflex. [name] was nondominant and not bypassed.  . Knee surgery      bilateral  . Hernia repair  1999    hernia surgery  . Eye surgery  2003 2004    right eye - lens implant  . Hand surgery      right  . Cardiac catheterization  02/2004    Normal left ventricular function. Significant multivessel coronary artery disease corresponding to left main equivalent disease with diffuse tubular narrowing of 50% in the proximal left anterior descending, 905 in the ramus intermadius vessel and 80% in the most proximal circumflex veseel with 70% mid A-V groove circumflex stenosis.    No  Active Allergies  Current Outpatient Prescriptions  Medication Sig Dispense Refill  . ALPHAGAN P 0.1 % SOLN Place 1 drop into the right eye 2 (two) times daily.    [name] aspirin 81 MG tablet Take 1 tablet by mouth daily.    . chlorpheniramine (CHLOR-TRIMETON) 4 MG tablet Take 8 mg by mouth at bedtime as needed.    . diphenoxylate-atropine (LOMOTIL) 2.5-0.025 MG per tablet Take 1-2 tablets by mouth every 4-6 hours as needed for diarrhea. 60 tablet 0  . dorzolamide (TRUSOPT) 2 % ophthalmic solution Place 1 drop into the right eye 2 (two)  times daily.      [name] glipiZIDE (GLUCOTROL) 5 MG tablet Take by mouth 2 (two) times daily before a meal.      . hydrochlorothiazide (MICROZIDE) 12.5 MG capsule Take 1 capsule (12.5 mg total) by mouth daily. 90 capsule 3  . LEVEMIR FLEXTOUCH 100 UNIT/ML Pen Inject 26 Units into the skin daily. Or as directed    . loperamide (IMODIUM) 2 MG capsule Take by mouth as needed for diarrhea or loose stools.    [name] losartan (COZAAR) 100 MG tablet Take by mouth daily.      . metFORMIN (GLUCOPHAGE) 1000 MG tablet Take by mouth 2 (two) times daily with a meal.      . metoprolol tartrate (LOPRESSOR) 25 MG tablet Take 1 tablet (25 mg total) by mouth 2 (two) times daily. 60 tablet 6  . QVAR 80 MCG/ACT inhaler Inhale 2 puffs into the lungs 2 (two) times daily.    [name] SALINE NASAL MIST NA 2 sprays by Nasal route as needed.      . simvastatin (ZOCOR) 20 MG tablet Take 1 tablet by mouth daily.    [name] QVAR 80 MCG/ACT inhaler USE 2 PUFFS FIRST THING IN THE MORNING AND 2 PUFFS IN THE EVENING ABOUT 12 HOURS LATER 1 Inhaler 2   No current facility-administered medications for this visit.    History   Social History  . Marital Status: Married    Spouse Name: N/A  . Number of Children: 3  . Years of Education: N/A   Occupational History  . Retired    Social History Main Topics  . Smoking status: Never Smoker   . Smokeless tobacco: Current User    Types: Snuff     Comment: occasional snuff use  . Alcohol Use: No  . Drug Use: No  . Sexual Activity: Not on file   Other Topics Concern  . Not on file   Social History Narrative   Never smoked cigs regularly but stopped all exposure 21s   Lives with wife    Family History  Problem Relation Age of Onset  . Asthma Mother   . Emphysema Father   . Colon cancer Neg Hx    Social history is normal and that he is married has 3 children 4 grandchildren. He does try to exercise and remains active. There is no tobacco or alcohol use.  ROS General: Negative; No  fevers, chills, or night sweats;  HEENT: Negative; No changes in vision or hearing, sinus congestion, difficulty swallowing Pulmonary: Negative; No cough, wheezing, shortness of breath, hemoptysis Cardiovascular: Negative; No chest pain, presyncope, syncope, palpitations GI: As if her recent diarrhea and diverticular disease. GU: Negative; No dysuria, hematuria, or difficulty voiding Musculoskeletal: Negative; no myalgias, joint pain, or weakness Hematologic/Oncology: Negative; no easy bruising, bleeding Endocrine: Negative; no heat/cold intolerance; no diabetes Neuro: Negative; no changes in balance, headaches Skin: Negative; No  rashes or skin lesions Psychiatric: Negative; No behavioral problems, depression Sleep: Negative; No snoring, daytime sleepiness, hypersomnolence, bruxism, restless legs, hypnogognic hallucinations, no cataplexy Other comprehensive 14 point system review is negative.  PE BP 168/80 and repeat by me 148/84.  Pulse 72.  Height 510, weight 199; body mass index 28.6 kg/meter squared Wt Readings from Last 3 Encounters:  08/10/14 199 lb 9.6 oz (90.538 kg)  04/08/14 200 lb (90.719 kg)  02/11/14 197 lb (89.359 kg)   General: Alert, oriented, no distress.  Skin: normal turgor, no rashes HEENT: Normocephalic, atraumatic. Pupils round and reactive; sclera anicteric;no lid lag.  Nose without nasal septal hypertrophy Mouth/Parynx benign; Mallinpatti scale 2 Neck: No JVD, no carotid bruits with normal carotid upstroke Lungs: clear to ausculatation and percussion; no wheezing or rales Chest wall: Nontender to palpation Heart: RRR, s1 s2 normal 1/6 systolic murmur; .  No diastolic murmur.  No rubs thrills or heaves Abdomen: soft, nontender; no hepatosplenomehaly, BS+; abdominal aorta nontender and not dilated by palpation. Pulses 2+ Extremities: no clubbing cyanosis or edema, Homan's sign negative  Neurologic: grossly nonfocal Psychologic: normal affect and mood.  ECG  (independently read by me): Normal sinus rhythm at 72 bpm.  Incomplete left bundle-branch block.  T-wave abnormality in aVL.  Normal intervals.  November 2015 ECG (independently read by me): Sinus rhythm with first-degree AV block with a heart rate at 80 bpm.  PR interval 228 ms.  T-wave changes in leads 1 and aVL.  Poor anterior R-wave progression.  Prior November 2014 ECG: Sinus rhythm with first-degree AV block. Poor progression anteroseptally. ST-T changes laterally, unchanged  LABS:  BMP Latest Ref Rng 01/25/2013  Glucose 70 - 99 mg/dL 162(H)  BUN 6 - 23 mg/dL 10  Creatinine 0.50 - 1.35 mg/dL 0.70  Sodium 135 - 145 mEq/L 130(L)  Potassium 3.5 - 5.1 mEq/L 3.4(L)  Chloride 96 - 112 mEq/L 92(L)  CO2 19 - 32 mEq/L 27  Calcium 8.4 - 10.5 mg/dL 9.4   No flowsheet data found. CBC Latest Ref Rng 01/25/2013  WBC 4.0 - 10.5 K/uL 7.4  Hemoglobin 13.0 - 17.0 g/dL 13.2  Hematocrit 39.0 - 52.0 % 37.0(L)  Platelets 150 - 400 K/uL 243   Lab Results  Component Value Date   MCV 89.2 01/25/2013    No results found for: TSH  No results found for: HGBA1C  RADIOLOGY: No results found.    ASSESSMENT AND PLAN:  Nathan Howell is a 80 year old white male who is 11 years status post CABG surgery x3. His last stress test 4 years ago showed mild inferior thinning felt to be due to diaphragmatic attenuation without ischemia. Presently, he denies recurrent anginal symptoms.  He denies any change in exercise tolerance or development of exertional shortness of breath.  Blood pressure today is elevated.  His resting pulse is 72.  He has been taking metoprolol tartrate only 25 mg daily rather than twice a day.  I have recommended he increase his frequency and take this twice a day for more optimal blood pressure control.  He is tolerating his current dose of losartan and low-dose hydrochlorothiazide.  There is no edema.  He is on simvastatin 20 mg for hyperlipidemia and is tolerating this well without  myalgias.  Laboratory by Dr. Reynaldo Minium revealed total cholesterol  121, LDL 69, HDL 44, triglycerides 72.  He had normal LFTs and renal function.  Hemoglobin A1c was 5.8.  And I last saw him I recommended he undergo a four-year  follow-up nuclear perfusion study.  He never had this done.  He had issues with chronic diarrhea and was found to have diverticulitis on colonoscopy.  His diarrhea has since resolved with significant change in his diet.  He is not having any bleeding issues and continues to take low-dose aspirin.  He is diabetic on Glucophage 1000 mg twice a day and lipids and glipizide twice a day.  He takes an inhaler as needed for asthma.  I will see him in 6 months for cardiology evaluation or sooner if problems arise.  Troy Sine, MD, [facility]  08/11/2014 7:56 PM

## 2015-02-07 DIAGNOSIS — T148 Other injury of unspecified body region: Secondary | ICD-10-CM | POA: Diagnosis not present

## 2015-02-07 DIAGNOSIS — W548XXA Other contact with dog, initial encounter: Secondary | ICD-10-CM | POA: Diagnosis not present

## 2015-02-11 DIAGNOSIS — Z23 Encounter for immunization: Secondary | ICD-10-CM | POA: Diagnosis not present

## 2015-02-16 ENCOUNTER — Observation Stay (HOSPITAL_BASED_OUTPATIENT_CLINIC_OR_DEPARTMENT_OTHER)
Admission: EM | Admit: 2015-02-16 | Discharge: 2015-02-18 | Disposition: A | Discharge status: Home / Self Care | Payer: Medicare HMO | Attending: Internal Medicine | Admitting: Internal Medicine

## 2015-02-16 ENCOUNTER — Emergency Department (HOSPITAL_BASED_OUTPATIENT_CLINIC_OR_DEPARTMENT_OTHER): Payer: Medicare HMO

## 2015-02-16 ENCOUNTER — Encounter (HOSPITAL_BASED_OUTPATIENT_CLINIC_OR_DEPARTMENT_OTHER): Payer: Self-pay

## 2015-02-16 DIAGNOSIS — I10 Essential (primary) hypertension: Secondary | ICD-10-CM | POA: Diagnosis not present

## 2015-02-16 DIAGNOSIS — J449 Chronic obstructive pulmonary disease, unspecified: Secondary | ICD-10-CM | POA: Diagnosis not present

## 2015-02-16 DIAGNOSIS — I672 Cerebral atherosclerosis: Secondary | ICD-10-CM | POA: Insufficient documentation

## 2015-02-16 DIAGNOSIS — R531 Weakness: Secondary | ICD-10-CM | POA: Diagnosis not present

## 2015-02-16 DIAGNOSIS — E785 Hyperlipidemia, unspecified: Secondary | ICD-10-CM | POA: Diagnosis present

## 2015-02-16 DIAGNOSIS — Z951 Presence of aortocoronary bypass graft: Secondary | ICD-10-CM | POA: Insufficient documentation

## 2015-02-16 DIAGNOSIS — Z794 Long term (current) use of insulin: Secondary | ICD-10-CM | POA: Insufficient documentation

## 2015-02-16 DIAGNOSIS — K219 Gastro-esophageal reflux disease without esophagitis: Secondary | ICD-10-CM | POA: Insufficient documentation

## 2015-02-16 DIAGNOSIS — G9389 Other specified disorders of brain: Secondary | ICD-10-CM | POA: Insufficient documentation

## 2015-02-16 DIAGNOSIS — Z79899 Other long term (current) drug therapy: Secondary | ICD-10-CM | POA: Insufficient documentation

## 2015-02-16 DIAGNOSIS — K5289 Other specified noninfective gastroenteritis and colitis: Secondary | ICD-10-CM | POA: Insufficient documentation

## 2015-02-16 DIAGNOSIS — E119 Type 2 diabetes mellitus without complications: Secondary | ICD-10-CM | POA: Diagnosis not present

## 2015-02-16 DIAGNOSIS — H409 Unspecified glaucoma: Secondary | ICD-10-CM | POA: Diagnosis not present

## 2015-02-16 DIAGNOSIS — J4520 Mild intermittent asthma, uncomplicated: Secondary | ICD-10-CM

## 2015-02-16 DIAGNOSIS — R51 Headache: Secondary | ICD-10-CM | POA: Diagnosis not present

## 2015-02-16 DIAGNOSIS — J45909 Unspecified asthma, uncomplicated: Secondary | ICD-10-CM | POA: Diagnosis not present

## 2015-02-16 DIAGNOSIS — J328 Other chronic sinusitis: Secondary | ICD-10-CM | POA: Insufficient documentation

## 2015-02-16 DIAGNOSIS — I2510 Atherosclerotic heart disease of native coronary artery without angina pectoris: Secondary | ICD-10-CM | POA: Diagnosis not present

## 2015-02-16 DIAGNOSIS — R519 Headache, unspecified: Secondary | ICD-10-CM | POA: Diagnosis present

## 2015-02-16 DIAGNOSIS — E871 Hypo-osmolality and hyponatremia: Secondary | ICD-10-CM | POA: Diagnosis not present

## 2015-02-16 DIAGNOSIS — I440 Atrioventricular block, first degree: Secondary | ICD-10-CM | POA: Insufficient documentation

## 2015-02-16 DIAGNOSIS — R300 Dysuria: Secondary | ICD-10-CM | POA: Insufficient documentation

## 2015-02-16 DIAGNOSIS — G4440 Drug-induced headache, not elsewhere classified, not intractable: Secondary | ICD-10-CM

## 2015-02-16 DIAGNOSIS — Z720 Tobacco use: Secondary | ICD-10-CM | POA: Insufficient documentation

## 2015-02-16 DIAGNOSIS — Z7982 Long term (current) use of aspirin: Secondary | ICD-10-CM | POA: Insufficient documentation

## 2015-02-16 DIAGNOSIS — R197 Diarrhea, unspecified: Secondary | ICD-10-CM | POA: Diagnosis present

## 2015-02-16 DIAGNOSIS — Z8719 Personal history of other diseases of the digestive system: Secondary | ICD-10-CM | POA: Insufficient documentation

## 2015-02-16 DIAGNOSIS — Z7984 Long term (current) use of oral hypoglycemic drugs: Secondary | ICD-10-CM | POA: Insufficient documentation

## 2015-02-16 HISTORY — DX: Type 2 diabetes mellitus without complications: E11.9

## 2015-02-16 LAB — TROPONIN I

## 2015-02-16 LAB — COMPREHENSIVE METABOLIC PANEL
ALT: 12 U/L — AB (ref 17–63)
ANION GAP: 6 (ref 5–15)
AST: 15 U/L (ref 15–41)
Albumin: 4 g/dL (ref 3.5–5.0)
Alkaline Phosphatase: 44 U/L (ref 38–126)
BUN: 9 mg/dL (ref 6–20)
CALCIUM: 8.8 mg/dL — AB (ref 8.9–10.3)
CHLORIDE: 90 mmol/L — AB (ref 101–111)
CO2: 28 mmol/L (ref 22–32)
CREATININE: 0.63 mg/dL (ref 0.61–1.24)
Glucose, Bld: 126 mg/dL — ABNORMAL HIGH (ref 65–99)
Potassium: 3.5 mmol/L (ref 3.5–5.1)
Sodium: 124 mmol/L — ABNORMAL LOW (ref 135–145)
Total Bilirubin: 0.8 mg/dL (ref 0.3–1.2)
Total Protein: 7.2 g/dL (ref 6.5–8.1)

## 2015-02-16 LAB — URINE MICROSCOPIC-ADD ON: WBC UA: NONE SEEN WBC/hpf (ref 0–5)

## 2015-02-16 LAB — URINALYSIS, ROUTINE W REFLEX MICROSCOPIC
Bilirubin Urine: NEGATIVE
Glucose, UA: NEGATIVE mg/dL
KETONES UR: NEGATIVE mg/dL
Leukocytes, UA: NEGATIVE
Nitrite: NEGATIVE
PH: 6.5 (ref 5.0–8.0)
PROTEIN: NEGATIVE mg/dL
Specific Gravity, Urine: 1.011 (ref 1.005–1.030)

## 2015-02-16 LAB — CBC
HEMATOCRIT: 37.7 % — AB (ref 39.0–52.0)
HEMOGLOBIN: 13.4 g/dL (ref 13.0–17.0)
MCH: 31.5 pg (ref 26.0–34.0)
MCHC: 35.5 g/dL (ref 30.0–36.0)
MCV: 88.5 fL (ref 78.0–100.0)
PLATELETS: 282 10*3/uL (ref 150–400)
RBC: 4.26 MIL/uL (ref 4.22–5.81)
RDW: 11.8 % (ref 11.5–15.5)
WBC: 7.8 10*3/uL (ref 4.0–10.5)

## 2015-02-16 LAB — PROTIME-INR
INR: 1.09 (ref 0.00–1.49)
Prothrombin Time: 14.3 seconds (ref 11.6–15.2)

## 2015-02-16 LAB — OSMOLALITY, URINE: Osmolality, Ur: 354 mOsm/kg (ref 300–900)

## 2015-02-16 LAB — GLUCOSE, CAPILLARY: GLUCOSE-CAPILLARY: 113 mg/dL — AB (ref 65–99)

## 2015-02-16 LAB — OSMOLALITY: Osmolality: 259 mOsm/kg — ABNORMAL LOW (ref 275–295)

## 2015-02-16 MED ORDER — LOSARTAN POTASSIUM 50 MG PO TABS
100.0000 mg | ORAL_TABLET | Freq: Every day | ORAL | Status: DC
  Administered 2015-02-17 – 2015-02-18 (×2): 100 mg via ORAL
  Filled 2015-02-16 (×3): qty 2

## 2015-02-16 MED ORDER — ALUM & MAG HYDROXIDE-SIMETH 200-200-20 MG/5ML PO SUSP: 30.0000 mL | Freq: Four times a day (QID) | ORAL | Status: DC | PRN

## 2015-02-16 MED ORDER — BUDESONIDE 0.25 MG/2ML IN SUSP
0.2500 mg | Freq: Two times a day (BID) | RESPIRATORY_TRACT | Status: DC
  Administered 2015-02-16 – 2015-02-18 (×4): 0.25 mg via RESPIRATORY_TRACT
  Filled 2015-02-16 (×4): qty 2

## 2015-02-16 MED ORDER — IBUPROFEN 800 MG PO TABS
ORAL_TABLET | ORAL | Status: AC
  Administered 2015-02-16: 21:00:00
  Filled 2015-02-16: qty 1

## 2015-02-16 MED ORDER — INSULIN DETEMIR 100 UNIT/ML ~~LOC~~ SOLN
20.0000 [IU] | Freq: Every day | SUBCUTANEOUS | Status: DC
  Administered 2015-02-17 – 2015-02-18 (×2): 20 [IU] via SUBCUTANEOUS
  Filled 2015-02-16 (×2): qty 0.2

## 2015-02-16 MED ORDER — ENOXAPARIN SODIUM 40 MG/0.4ML ~~LOC~~ SOLN
40.0000 mg | SUBCUTANEOUS | Status: DC
  Administered 2015-02-16 – 2015-02-17 (×2): 40 mg via SUBCUTANEOUS
  Filled 2015-02-16 (×2): qty 0.4

## 2015-02-16 MED ORDER — ASPIRIN EC 81 MG PO TBEC
81.0000 mg | DELAYED_RELEASE_TABLET | Freq: Every day | ORAL | Status: DC
  Administered 2015-02-16 – 2015-02-18 (×3): 81 mg via ORAL
  Filled 2015-02-16 (×3): qty 1

## 2015-02-16 MED ORDER — SIMVASTATIN 20 MG PO TABS
20.0000 mg | ORAL_TABLET | Freq: Every day | ORAL | Status: DC
Start: 2015-02-16 — End: 2015-02-18
  Administered 2015-02-16 – 2015-02-18 (×3): 20 mg via ORAL
  Filled 2015-02-16 (×3): qty 1

## 2015-02-16 MED ORDER — DIPHENHYDRAMINE HCL 25 MG PO CAPS
25.0000 mg | ORAL_CAPSULE | Freq: Four times a day (QID) | ORAL | Status: DC
  Administered 2015-02-16 – 2015-02-18 (×7): 25 mg via ORAL
  Filled 2015-02-16 (×11): qty 1

## 2015-02-16 MED ORDER — ACETAMINOPHEN 650 MG RE SUPP: 650.0000 mg | Freq: Four times a day (QID) | RECTAL | Status: DC | PRN

## 2015-02-16 MED ORDER — ACETAMINOPHEN 325 MG PO TABS: 650.0000 mg | ORAL_TABLET | Freq: Four times a day (QID) | ORAL | Status: DC | PRN

## 2015-02-16 MED ORDER — DIPHENHYDRAMINE HCL 25 MG PO TABS
25.0000 mg | ORAL_TABLET | Freq: Four times a day (QID) | ORAL | Status: DC
  Filled 2015-02-16 (×2): qty 1

## 2015-02-16 MED ORDER — METOPROLOL TARTRATE 25 MG PO TABS
25.0000 mg | ORAL_TABLET | Freq: Two times a day (BID) | ORAL | Status: DC
  Administered 2015-02-16 – 2015-02-18 (×4): 25 mg via ORAL
  Filled 2015-02-16 (×5): qty 1

## 2015-02-16 MED ORDER — SODIUM CHLORIDE 0.9 % IV SOLN
INTRAVENOUS | Status: DC
  Administered 2015-02-16: 22:00:00 via INTRAVENOUS
  Administered 2015-02-17 (×2): 1000 mL via INTRAVENOUS
  Administered 2015-02-18: 02:00:00 via INTRAVENOUS

## 2015-02-16 MED ORDER — ALBUTEROL SULFATE (2.5 MG/3ML) 0.083% IN NEBU: 2.5000 mg | INHALATION_SOLUTION | RESPIRATORY_TRACT | Status: DC | PRN

## 2015-02-16 MED ORDER — INSULIN ASPART 100 UNIT/ML ~~LOC~~ SOLN: 0.0000 [IU] | Freq: Three times a day (TID) | SUBCUTANEOUS | Status: DC

## 2015-02-16 MED ORDER — BRIMONIDINE TARTRATE 0.2 % OP SOLN
1.0000 [drp] | Freq: Two times a day (BID) | OPHTHALMIC | Status: DC
  Administered 2015-02-16 – 2015-02-18 (×4): 1 [drp] via OPHTHALMIC
  Filled 2015-02-16: qty 5

## 2015-02-16 MED ORDER — HYDRALAZINE HCL 20 MG/ML IJ SOLN: 5.0000 mg | INTRAMUSCULAR | Status: DC | PRN

## 2015-02-16 MED ORDER — LOPERAMIDE HCL 2 MG PO CAPS
2.0000 mg | ORAL_CAPSULE | ORAL | Status: DC | PRN
Start: 2015-02-16 — End: 2015-02-18

## 2015-02-16 MED ORDER — DIPHENOXYLATE-ATROPINE 2.5-0.025 MG PO TABS: 1.0000 | ORAL_TABLET | Freq: Four times a day (QID) | ORAL | Status: DC | PRN

## 2015-02-16 MED ORDER — BRIMONIDINE TARTRATE 0.15 % OP SOLN
1.0000 [drp] | Freq: Two times a day (BID) | OPHTHALMIC | Status: DC
  Filled 2015-02-16: qty 5

## 2015-02-16 MED ORDER — DORZOLAMIDE HCL 2 % OP SOLN
1.0000 [drp] | Freq: Two times a day (BID) | OPHTHALMIC | Status: DC
  Administered 2015-02-16 – 2015-02-18 (×4): 1 [drp] via OPHTHALMIC
  Filled 2015-02-16: qty 10

## 2015-02-16 NOTE — ED Notes (Signed)
Patient transported to X-ray 

## 2015-02-16 NOTE — ED Notes (Addendum)
Dog bite to had in Oct-[facility]-was given augmentin that had allergic reaction-quit taking after 1/2 prescribed dose-pt now states he has been feeling weak,sore all over, stool darkened now baseline since the start of abx on 11/6-pt NAD-steady gait

## 2015-02-16 NOTE — ED Notes (Signed)
Patient transported to CT 

## 2015-02-16 NOTE — H&P (Addendum)
[facility] History and Physical  Nathan Howell [id number] DOB: 06-Mar-1935 DOA: 02/16/2015  Referring physician: ED physician PCP: Geoffery Lyons, MD  Specialists:   Chief Complaint:  Generalized weakness, headache, lightheadedness  HPI: Nathan Howell is a 80 y.o. male with PMH of hypertension, hyperlipidemia, diabetes mellitus, COPD, asthma, CAD, CABG, glaucoma, diverticulosis, collagenase colitis, chronic diarrhea, who presents with generalized weakness, headache, lightheadedness.  Patient reports that he was scratched by dog in [place] to right hand in dorsal aspect on 10/12 and was treated with Augmentin on 11/6. He states that he developed severe HA in the next day after starting Augmentin. He stopped taking it, but was able to have completed taking 10 pills in total. He does not have right hand swelling, redness or tenderness currently. His headache waxes and wanes. Currently HA is very mild when I saw pt on the floor. He has generalized weakness, dizziness and lightheadedness, but no vision change, hearing loss, unilateral weakness, facial droop, slurred speech, unilateral numbness. Patient has chronic diarrhea, which has not changed. He does not have nausea, vomiting, abdominal pain. He reports having mild burning on urination and dysuria sometimes, no increased urinary frequency.   In ED, patient was found to have sodium 124, renal function okay, WBC 7.8, temperature normal, no tachycardia, negative troponin, negative urinalysis, negative chest x-ray. CT head showed no evidence of acute intracranial abnormality; posterior left frontal lobe encephalomalacia, possibly from remote CVA; intracranial atherosclerosis, mild generalized cerebral volume loss and mild chronic small vessel ischemic white matter change, paranasal sinusitis.  Where does patient live?   At home    Can patient participate in ADLs? Some   Review of Systems:   General: no fevers, chills, no changes in  body weight, has fatigue HEENT: no blurry vision, hearing changes or sore throat Pulm: no dyspnea, coughing, wheezing CV: no chest pain, palpitations Abd: no nausea, vomiting, abdominal pain, has chronic diarrhea, no constipation GU: no dysuria, burning on urination, increased urinary frequency, hematuria  Ext: no leg edema Neuro: no unilateral weakness, numbness, or tingling, no vision change or hearing loss Skin: no rash MSK: No muscle spasm, no deformity, no limitation of range of movement in spin Heme: No easy bruising.  Travel history: No recent long distant travel.  Allergy: No Known Allergies  Past Medical History  Diagnosis Date  . Asthma     [place] - HFA 50% 03/16/09 > 50% 05/13/10  . Hyperlipidemia   . Hypertension   . Rhinitis medicamentosa     per remote [place] w/u  . DM (diabetes mellitus) ([place])   . Glaucoma   . GERD (gastroesophageal reflux disease)   . Coronary artery disease   . History of stress test 02/2011    Which showed mild inferior thinning felt to be diaphragmatic attenuation artifact without ichemia. Poststress EF was 65%.  [name] Hx of echocardiogram     EF>55% this showed moderate concentric LVH with normal systolic function. There was evidence for grade 1 diastolic relaxation abnormality with possible very small region of apical lateral hypokinesis.He did have moderate left atrial dilatation by volume assessment. There was mild mitral annular calcification with trace MR, trace TR, mild aortic valve sclerosis without stenosis    . COPD (chronic obstructive pulmonary disease) ([place])   . Abdominal hernia   . Diverticulosis   . Collagenous colitis     Past Surgical History  Procedure Laterality Date  . Coronary artery bypass graft      x3 By Dr Servando Snare  with LIMA to the LAD, a vein to the intermediate, a vein to the distal circumflex. [name] was nondominant and not bypassed.  . Knee surgery      bilateral  . Hernia repair  1999    hernia surgery  . Eye  surgery  2003 2004    right eye - lens implant  . Hand surgery      right  . Cardiac catheterization  02/2004    Normal left ventricular function. Significant multivessel coronary artery disease corresponding to left main equivalent disease with diffuse tubular narrowing of 50% in the proximal left anterior descending, 905 in the ramus intermadius vessel and 80% in the most proximal circumflex veseel with 70% mid A-V groove circumflex stenosis.    Social History:  reports that he has never smoked. His smokeless tobacco use includes Snuff. He reports that he does not drink alcohol or use illicit drugs.  Family History:  Family History  Problem Relation Age of Onset  . Asthma Mother   . Emphysema Father   . Colon cancer Neg Hx      Prior to Admission medications   Medication Sig Start Date End Date Taking? Authorizing Provider  ALPHAGAN P 0.1 % SOLN Place 1 drop into the right eye 2 (two) times daily. 12/05/13   Historical Provider, MD  aspirin 81 MG tablet Take 1 tablet by mouth daily. 11/18/09   Historical Provider, MD  chlorpheniramine (CHLOR-TRIMETON) 4 MG tablet Take 8 mg by mouth at bedtime as needed.    Historical Provider, MD  diphenoxylate-atropine (LOMOTIL) 2.5-0.025 MG per tablet Take 1-2 tablets by mouth every 4-6 hours as needed for diarrhea. 05/25/14   Irene Shipper, MD  dorzolamide (TRUSOPT) 2 % ophthalmic solution Place 1 drop into the right eye 2 (two) times daily.      Historical Provider, MD  glipiZIDE (GLUCOTROL) 5 MG tablet Take by mouth 2 (two) times daily before a meal.      Historical Provider, MD  hydrochlorothiazide (MICROZIDE) 12.5 MG capsule Take 1 capsule (12.5 mg total) by mouth daily. 03/06/14   Troy Sine, MD  LEVEMIR FLEXTOUCH 100 UNIT/ML Pen Inject 26 Units into the skin daily. Or as directed 01/06/14   Historical Provider, MD  loperamide (IMODIUM) 2 MG capsule Take by mouth as needed for diarrhea or loose stools.    Historical Provider, MD  losartan (COZAAR)  100 MG tablet Take by mouth daily.      Historical Provider, MD  metFORMIN (GLUCOPHAGE) 1000 MG tablet Take by mouth 2 (two) times daily with a meal.      Historical Provider, MD  metoprolol tartrate (LOPRESSOR) 25 MG tablet Take 1 tablet (25 mg total) by mouth 2 (two) times daily. 08/10/14   Troy Sine, MD  QVAR 80 MCG/ACT inhaler USE 2 PUFFS FIRST THING IN THE MORNING AND 2 PUFFS IN THE EVENING ABOUT 12 HOURS LATER 07/07/11 04/17/14  Tanda Rockers, MD  QVAR 80 MCG/ACT inhaler Inhale 2 puffs into the lungs 2 (two) times daily. 07/29/14   Historical Provider, MD  SALINE NASAL MIST NA 2 sprays by Nasal route as needed.      Historical Provider, MD  simvastatin (ZOCOR) 20 MG tablet Take 1 tablet by mouth daily. 12/15/13   Historical Provider, MD    Physical Exam: Filed Vitals:   02/16/15 1830 02/16/15 2022 02/16/15 2023 02/16/15 2144  BP: 127/71  129/67   Pulse: 71  74 68  Temp:   98.4  F (36.9 C)   TempSrc:   Oral   Resp: 19  15 18   Height:  5\' 10"  (1.778 m)    Weight:  84.641 kg (186 lb 9.6 oz)    SpO2: 99%  97% 95%   General: Not in acute distress. Dry mucus and membrane. HEENT:       Eyes: PERRL, EOMI, no scleral icterus.       ENT: No discharge from the ears and nose, no pharynx injection, no tonsillar enlargement. Poor hearing.       Neck: No JVD, no bruit, no mass felt. Heme: No neck lymph node enlargement. Cardiac: S1/S2, RRR, No murmurs, No gallops or rubs. Pulm:  No rales, wheezing, rhonchi or rubs. Abd: Soft, nondistended, nontender, no rebound pain, no organomegaly, BS present. Ext: No pitting leg edema bilaterally. 2+DP/PT pulse bilaterally. Musculoskeletal: No joint deformities, No joint redness or warmth, no limitation of ROM in spin. Skin: No rashes.  Neuro: Alert, oriented X3, cranial nerves II-XII grossly intact, muscle strength 5/5 in all extremities, sensation to light touch intact. Brachial reflex 1+ bilaterally. Knee reflex 1+ bilaterally. Negative Babinski's sign.  Normal finger to nose test. Psych: Patient is not psychotic, no suicidal or hemocidal ideation.  Labs on Admission:  Basic Metabolic Panel:  Recent Labs Lab 02/16/15 1505  NA 124*  K 3.5  CL 90*  CO2 28  GLUCOSE 126*  BUN 9  CREATININE 0.63  CALCIUM 8.8*   Liver Function Tests:  Recent Labs Lab 02/16/15 1505  AST 15  ALT 12*  ALKPHOS 44  BILITOT 0.8  PROT 7.2  ALBUMIN 4.0   No results for input(s): LIPASE, AMYLASE in the last 168 hours. No results for input(s): AMMONIA in the last 168 hours. CBC:  Recent Labs Lab 02/16/15 1505  WBC 7.8  HGB 13.4  HCT 37.7*  MCV 88.5  PLT 282   Cardiac Enzymes:  Recent Labs Lab 02/16/15 1505  TROPONINI <0.03    BNP (last 3 results) No results for input(s): BNP in the last 8760 hours.  ProBNP (last 3 results) No results for input(s): PROBNP in the last 8760 hours.  CBG: No results for input(s): GLUCAP in the last 168 hours.  Radiological Exams on Admission: Dg Chest 2 View  02/16/2015  CLINICAL DATA:  Allergic reaction to medicine with body weakness and soreness. EXAM: CHEST  2 VIEW COMPARISON:  04/17/2013 FINDINGS: Mild elevation of the right hemidiaphragm. Both lungs are clear. Heart size is normal. Evidence of prior CABG procedure. No pleural effusions. IMPRESSION: No active cardiopulmonary disease. Electronically Signed   By: Markus Daft M.D.   On: 02/16/2015 15:33   Ct Head Wo Contrast  02/16/2015  CLINICAL DATA:  Headache.  Vision changes.  Weakness. EXAM: CT HEAD WITHOUT CONTRAST TECHNIQUE: Contiguous axial images were obtained from the base of the skull through the vertex without intravenous contrast. COMPARISON:  None. FINDINGS: No evidence of parenchymal hemorrhage or extra-axial fluid collection. No mass lesion, mass effect, or midline shift. No CT evidence of acute infarction. There is focal encephalomalacia in the posterior left frontal lobe. There is intracranial atherosclerosis. There is mild generalized  cerebral volume loss. Nonspecific mild subcortical and periventricular white matter hypodensity, most in keeping with chronic small vessel ischemic change. No ventriculomegaly. There is partial opacification of the ethmoidal air cells and frontal sinus bilaterally, right greater than left. The mastoid air cells are unopacified. No evidence of calvarial fracture. IMPRESSION: 1.  No evidence of acute intracranial abnormality.  2. Posterior left frontal lobe encephalomalacia, possibly from remote CVA. 3. Intracranial atherosclerosis, mild generalized cerebral volume loss and mild chronic small vessel ischemic white matter change. 4. Paranasal sinusitis. Electronically Signed   By: Ilona Sorrel M.D.   On: 02/16/2015 15:46    EKG: Independently reviewed. QTC 407, incomplete right bundle blockage, LAD, first degree AV block   Assessment/Plan Principal Problem:   Hyponatremia Active Problems:   Diarrhea   CAD (coronary artery disease)   HTN (hypertension)   Hyperlipidemia with target LDL less than 70   Diabetes mellitus with circulatory complication (HCC)   Generalized weakness   Headache   Asthma   COPD (chronic obstructive pulmonary disease) (HCC)   Glaucoma   Diabetes mellitus without complication (HCC)   Hyponatremia: Sodium 124. Etiology is likely multifactorial, including HCTZ use, polydipsia secondary to Lomotil use, GI loss from chronic diarrhea. Mental status is normal.  - will admit to med-surg bed for observation - hold HCTZ - We'll check urine sodium, urine cre, urine osmolality, serum osmolality, TSH - Continue with IV normal saline at 100 mL/h - Follow-up at the med - Fluid restriction to 1500 mL per day  Chronic Diarrhea 2/2 collagenous colitis: -Continue home Lomotil, loperamide  CAD (coronary artery disease): s/p CABG. No CP or SOB -Aspirin, metoprolol, Zocor  HTN (hypertension): -Hold HCTZ -Continue metoprolol -IV hydralazine when necessary  HLD: Last LDL was not  on record -Continue home medications: Zocor -Check FLP  DM-II: Last A1c not on record. Patient is taking Levemir, glipizide, and metformin at home -will decrease Lantus dose from 26-20 units daily -SSI -Check A1c  Generalized weakness: Likely due to multifactorial, including hyponatremia, deconditioning and dehydration -IV fluid as above -PTOT -Treated hyponatremia as above  Headaches: Likely due to the side effects of Augmentin. Headache improved significantly, currently it is a very mild. CT head has no acute intracranial abnormalities. -When necessary Tylenol  Asthma and COPD (chronic obstructive pulmonary disease) ([place]): Stable. No signs of acute exacerbation -When necessary albuterol inhaler, Pulmicort inhaler  Glaucoma: -Continue home eye drops  DVT ppx: SQ Heparin      Code Status: Full code Family Communication: None at bed side.  Disposition Plan: Admit to inpatient   Date of Service 02/16/2015    Ivor Costa [facility] Pager [phone]  If 7PM-7AM, please contact night-coverage [email or web address] Password [email or web address] 02/16/2015, 10:11 PM

## 2015-02-16 NOTE — Progress Notes (Signed)
During report RN at [facility] mentioned patient may be a stroke work up. Dr. Aileen Fass paged to clarify for bed placement information. MD stated patient will not need a stroke work up. Report continued and completed with Seth Bake ,RN.

## 2015-02-16 NOTE — ED Provider Notes (Signed)
CSN: [id number]     Arrival date & time 02/16/15  1431 History   First MD Initiated Contact with Patient 02/16/15 1500     Chief Complaint  Patient presents with  . Weakness     (Consider location/radiation/quality/duration/timing/severity/associated sxs/prior Treatment) HPI Comments: 10/12 scratched by dog in [place] to hand, Flu shot last Thurs 11/6 diagnosed with hand, was put on augmentin, took 10 pills, but developed severe HA and stopped taking it Feels hands improved HA 3-4 days, waxes and wanes  Started shuffling and feeling generalized weakness, off balance Since starting abx has felt off balance, dizziness, lightheadedness Patient believes hearing is worse, however wife reports he has had poor hearing for a long time and it is unchanged No facial droop/problems talking   Patient is a 80 y.o. male presenting with weakness.  Weakness Pertinent negatives include no chest pain (pings of pain lasting 1sec, right side to left side), no abdominal pain, no headaches and no shortness of breath.    Past Medical History  Diagnosis Date  . Hyperlipidemia   . Hypertension   . Rhinitis medicamentosa     per remote [facility] w/u  . Glaucoma   . GERD (gastroesophageal reflux disease)   . Coronary artery disease   . History of stress test 02/2011    Which showed mild inferior thinning felt to be diaphragmatic attenuation artifact without ichemia. Poststress EF was 65%.  [name] Hx of echocardiogram     EF>55% this showed moderate concentric LVH with normal systolic function. There was evidence for grade 1 diastolic relaxation abnormality with possible very small region of apical lateral hypokinesis.He did have moderate left atrial dilatation by volume assessment. There was mild mitral annular calcification with trace MR, trace TR, mild aortic valve sclerosis without stenosis    . Abdominal hernia   . Diverticulosis   . Collagenous colitis   . Asthma     [place] - HFA 50% 03/16/09 > 50% 05/13/10   . COPD (chronic obstructive pulmonary disease) ([place]) dx'd ~ 2015  . Type II diabetes mellitus [facility])    Past Surgical History  Procedure Laterality Date  . Coronary artery bypass graft      x3 By Dr Servando Snare with LIMA to the LAD, a vein to the intermediate, a vein to the distal circumflex. [name] was nondominant and not bypassed.  . Knee arthroscopy Bilateral   . Inguinal hernia repair  1999    "not sure which side"  . Cataract extraction w/ intraocular lens implant Right 2003   . Hand surgery Right     "don't remember what was done"  . Corneal transplant Right ~ 2004  . Glaucoma valve insertion Right   . Cardiac catheterization  02/2004    Normal left ventricular function. Significant multivessel coronary artery disease corresponding to left main equivalent disease with diffuse tubular narrowing of 50% in the proximal left anterior descending, 905 in the ramus intermadius vessel and 80% in the most proximal circumflex veseel with 70% mid A-V groove circumflex stenosis.  . Coronary angioplasty     Family History  Problem Relation Age of Onset  . Asthma Mother   . Emphysema Father   . Colon cancer Neg Hx    Social History  Substance Use Topics  . Smoking status: Never Smoker   . Smokeless tobacco: Current User    Types: Snuff  . Alcohol Use: No    Review of Systems  Constitutional: Positive for appetite change and fatigue. Negative for fever.  HENT: Negative for sore throat.   Eyes: Negative for visual disturbance.  Respiratory: Negative for cough and shortness of breath.   Cardiovascular: Negative for chest pain (pings of pain lasting 1sec, right side to left side) and leg swelling.  Gastrointestinal: Positive for diarrhea (off and on all the time). Negative for nausea, vomiting and abdominal pain.  Genitourinary: Negative for difficulty urinating.  Musculoskeletal: Positive for arthralgias (left knee pain). Negative for back pain and neck stiffness.  Skin: Negative for  rash.  Neurological: Positive for weakness. Negative for syncope and headaches.      Allergies  Review of patient's allergies indicates no known allergies.  Home Medications   Prior to Admission medications   Medication Sig Start Date End Date Taking? Authorizing Provider  ALPHAGAN P 0.1 % SOLN Place 1 drop into the right eye 2 (two) times daily. 12/05/13  Yes Historical Provider, MD  aspirin 81 MG tablet Take 1 tablet by mouth daily. 11/18/09  Yes Historical Provider, MD  glipiZIDE (GLUCOTROL) 5 MG tablet Take by mouth 2 (two) times daily before a meal.     Yes Historical Provider, MD  LEVEMIR FLEXTOUCH 100 UNIT/ML Pen Inject 26 Units into the skin daily. Or as directed 01/06/14  Yes Historical Provider, MD  losartan (COZAAR) 100 MG tablet Take by mouth daily.     Yes Historical Provider, MD  metFORMIN (GLUCOPHAGE) 1000 MG tablet Take by mouth 2 (two) times daily with a meal.     Yes Historical Provider, MD  QVAR 80 MCG/ACT inhaler Inhale 2 puffs into the lungs 2 (two) times daily. 07/29/14  Yes Historical Provider, MD  chlorpheniramine (CHLOR-TRIMETON) 4 MG tablet Take 8 mg by mouth at bedtime as needed.    Historical Provider, MD  diphenoxylate-atropine (LOMOTIL) 2.5-0.025 MG per tablet Take 1-2 tablets by mouth every 4-6 hours as needed for diarrhea. 05/25/14   Irene Shipper, MD  dorzolamide (TRUSOPT) 2 % ophthalmic solution Place 1 drop into the right eye 2 (two) times daily.      Historical Provider, MD  hydrochlorothiazide (MICROZIDE) 12.5 MG capsule Take 1 capsule (12.5 mg total) by mouth daily. 03/06/14   Troy Sine, MD  loperamide (IMODIUM) 2 MG capsule Take by mouth as needed for diarrhea or loose stools.    Historical Provider, MD  metoprolol tartrate (LOPRESSOR) 25 MG tablet Take 1 tablet (25 mg total) by mouth 2 (two) times daily. 08/10/14   Troy Sine, MD  QVAR 80 MCG/ACT inhaler USE 2 PUFFS FIRST THING IN THE MORNING AND 2 PUFFS IN THE EVENING ABOUT 12 HOURS LATER 07/07/11  04/17/14  Tanda Rockers, MD  SALINE NASAL MIST NA 2 sprays by Nasal route as needed.      Historical Provider, MD  simvastatin (ZOCOR) 20 MG tablet Take 1 tablet by mouth daily. 12/15/13   Historical Provider, MD   BP 139/76 mmHg  Pulse 84  Temp(Src) 97.7 F (36.5 C) (Oral)  Resp 20  Ht 5\' 10"  (1.778 m)  Wt 186 lb 9.6 oz (84.641 kg)  BMI 26.77 kg/m2  SpO2 98% Physical Exam  Constitutional: He is oriented to person, place, and time. He appears well-developed and well-nourished. No distress.  HENT:  Head: Normocephalic and atraumatic.  Eyes: Conjunctivae and EOM are normal.  Neck: Normal range of motion.  Cardiovascular: Normal rate, regular rhythm, normal heart sounds and intact distal pulses.  Exam reveals no gallop and no friction rub.   No murmur heard. Pulmonary/Chest: Effort normal  and breath sounds normal. No respiratory distress. He has no wheezes. He has no rales.  Abdominal: Soft. He exhibits no distension. There is no tenderness. There is no guarding.  Musculoskeletal: He exhibits no edema.  Neurological: He is alert and oriented to person, place, and time. He has normal strength. He displays no tremor. No cranial nerve deficit or sensory deficit. He exhibits normal muscle tone. He displays a negative Romberg sign. Coordination and gait normal. GCS eye subscore is 4. GCS verbal subscore is 5. GCS motor subscore is 6.  Normal finger/nose heel to shin   Skin: Skin is warm and dry. He is not diaphoretic.  Nursing note and vitals reviewed.   ED Course  Procedures (including critical care time) Labs Review Labs Reviewed  URINALYSIS, ROUTINE W REFLEX MICROSCOPIC (NOT AT [facility]) - Abnormal; Notable for the following:    Hgb urine dipstick SMALL (*)    All other components within normal limits  CBC - Abnormal; Notable for the following:    HCT 37.7 (*)    All other components within normal limits  COMPREHENSIVE METABOLIC PANEL - Abnormal; Notable for the following:    Sodium  124 (*)    Chloride 90 (*)    Glucose, Bld 126 (*)    Calcium 8.8 (*)    ALT 12 (*)    All other components within normal limits  URINE MICROSCOPIC-ADD ON - Abnormal; Notable for the following:    Squamous Epithelial / LPF 0-5 (*)    Bacteria, UA RARE (*)    All other components within normal limits  OSMOLALITY - Abnormal; Notable for the following:    Osmolality 259 (*)    All other components within normal limits  GLUCOSE, CAPILLARY - Abnormal; Notable for the following:    Glucose-Capillary 113 (*)    All other components within normal limits  TROPONIN I  OSMOLALITY, URINE  PROTIME-INR  SODIUM, URINE, RANDOM  CREATININE, URINE, RANDOM  HEMOGLOBIN A1C  TSH  LIPID PANEL  BASIC METABOLIC PANEL  CBC    Imaging Review Dg Chest 2 View  02/16/2015  CLINICAL DATA:  Allergic reaction to medicine with body weakness and soreness. EXAM: CHEST  2 VIEW COMPARISON:  04/17/2013 FINDINGS: Mild elevation of the right hemidiaphragm. Both lungs are clear. Heart size is normal. Evidence of prior CABG procedure. No pleural effusions. IMPRESSION: No active cardiopulmonary disease. Electronically Signed   By: Markus Daft M.D.   On: 02/16/2015 15:33   Ct Head Wo Contrast  02/16/2015  CLINICAL DATA:  Headache.  Vision changes.  Weakness. EXAM: CT HEAD WITHOUT CONTRAST TECHNIQUE: Contiguous axial images were obtained from the base of the skull through the vertex without intravenous contrast. COMPARISON:  None. FINDINGS: No evidence of parenchymal hemorrhage or extra-axial fluid collection. No mass lesion, mass effect, or midline shift. No CT evidence of acute infarction. There is focal encephalomalacia in the posterior left frontal lobe. There is intracranial atherosclerosis. There is mild generalized cerebral volume loss. Nonspecific mild subcortical and periventricular white matter hypodensity, most in keeping with chronic small vessel ischemic change. No ventriculomegaly. There is partial opacification  of the ethmoidal air cells and frontal sinus bilaterally, right greater than left. The mastoid air cells are unopacified. No evidence of calvarial fracture. IMPRESSION: 1.  No evidence of acute intracranial abnormality. 2. Posterior left frontal lobe encephalomalacia, possibly from remote CVA. 3. Intracranial atherosclerosis, mild generalized cerebral volume loss and mild chronic small vessel ischemic white matter change. 4. Paranasal sinusitis. Electronically  Signed   By: Ilona Sorrel M.D.   On: 02/16/2015 15:46   I have personally reviewed and evaluated these images and lab results as part of my medical decision-making.   EKG Interpretation   Date/Time:  Tuesday February 16 2015 15:28:46 EST Ventricular Rate:  65 PR Interval:  228 QRS Duration: 112 QT Interval:  392 QTC Calculation: 407 R Axis:   -64 Text Interpretation:  Sinus rhythm with 1st degree A-V block Left anterior  fascicular block Abnormal ECG No significant change since last tracing ,  however mild  change in interventricular condition delay Confirmed by  [facility] MD, Mahum Betten ([id number]) on 02/17/2015 1:11:26 AM      MDM   Final diagnoses:  Hyponatremia   31 -year-old male with history of coronary artery disease, hypertension, hyperlipidemia, diabetes, COPD presents with concern of generalized weakness and headaches for 1 week.  CT head showed no acute abnormalities.  Urinalysis shows no signs of UTI. X-ray shows no signs of pneumonia. Patient's neurologic exam is normal and have low suspicion for CVA. History is not consistent with subarachnoid hemorrhage or meningitis. Patient reports occasional 1 second episodes of chest pain, with no episodes today, no exertional episodes, and EKG and troponin do not show signs of acute ischemia. CMP showed hyponatremia with a sodium of 124, which is likely the cause of patient's symptoms. Unclear etiology of hyponatremia. Patient appears euvolemic on exam, denies any worsening of diarrhea,  denies changes in medication doses.  Urine studies ordered, and patient will be admitted to hospitalists for further care.   Gareth Morgan, MD 02/17/15 [phone]

## 2015-02-16 NOTE — Progress Notes (Signed)
BP 167/95 mmHg  Pulse 84  Temp(Src) 97.9 F (36.6 C) (Oral)  Resp 20  Ht 5\' 10"  (1.778 m)  Wt 90.719 kg (200 lb)  BMI 28.70 kg/m2  SpO2 100% Hyponatremia/HA' and generalized weakness: 80 year old male with past medical history of hypertension, also remote history of CABG came into the ED for generalized weakness and headache for the past 4-5 days progressively getting worst. He is now feeling dizzy and lightheaded. He's coming to MedSurg bed have ordered a urinary sodium urinary creatinine serum osmolarity urine osmolality and orthostatics, before starting IV fluids. CT of the head and chest x-ray are unremarkable.

## 2015-02-17 DIAGNOSIS — R51 Headache: Secondary | ICD-10-CM | POA: Diagnosis not present

## 2015-02-17 DIAGNOSIS — Z794 Long term (current) use of insulin: Secondary | ICD-10-CM | POA: Diagnosis not present

## 2015-02-17 DIAGNOSIS — R531 Weakness: Secondary | ICD-10-CM | POA: Diagnosis not present

## 2015-02-17 DIAGNOSIS — E119 Type 2 diabetes mellitus without complications: Secondary | ICD-10-CM | POA: Diagnosis not present

## 2015-02-17 DIAGNOSIS — E871 Hypo-osmolality and hyponatremia: Principal | ICD-10-CM

## 2015-02-17 DIAGNOSIS — E0851 Diabetes mellitus due to underlying condition with diabetic peripheral angiopathy without gangrene: Secondary | ICD-10-CM | POA: Diagnosis not present

## 2015-02-17 LAB — GLUCOSE, CAPILLARY
Glucose-Capillary: 109 mg/dL — ABNORMAL HIGH (ref 65–99)
Glucose-Capillary: 113 mg/dL — ABNORMAL HIGH (ref 65–99)
Glucose-Capillary: 112 mg/dL — ABNORMAL HIGH (ref 65–99)
Glucose-Capillary: 147 mg/dL — ABNORMAL HIGH (ref 65–99)

## 2015-02-17 LAB — BASIC METABOLIC PANEL
ANION GAP: 7 (ref 5–15)
BUN: 6 mg/dL (ref 6–20)
CHLORIDE: 96 mmol/L — AB (ref 101–111)
CO2: 26 mmol/L (ref 22–32)
Calcium: 8.5 mg/dL — ABNORMAL LOW (ref 8.9–10.3)
Creatinine, Ser: 0.66 mg/dL (ref 0.61–1.24)
GFR calc non Af Amer: >60 mL/min (ref >60)
Glucose, Bld: 97 mg/dL (ref 65–99)
POTASSIUM: 3.5 mmol/L (ref 3.5–5.1)
Sodium: 129 mmol/L — ABNORMAL LOW (ref 135–145)

## 2015-02-17 LAB — LIPID PANEL
CHOL/HDL RATIO: 3.5 ratio
CHOLESTEROL: 99 mg/dL (ref 0–200)
HDL: 28 mg/dL — ABNORMAL LOW (ref >40)
LDL Cholesterol: 59 mg/dL (ref 0–99)
TRIGLYCERIDES: 59 mg/dL (ref <150)
VLDL: 12 mg/dL (ref 0–40)

## 2015-02-17 LAB — CBC
HCT: 34 % — ABNORMAL LOW (ref 39.0–52.0)
HEMOGLOBIN: 12.4 g/dL — AB (ref 13.0–17.0)
MCH: 32 pg (ref 26.0–34.0)
MCHC: 36.5 g/dL — ABNORMAL HIGH (ref 30.0–36.0)
MCV: 87.6 fL (ref 78.0–100.0)
Platelets: 236 10*3/uL (ref 150–400)
RBC: 3.88 MIL/uL — AB (ref 4.22–5.81)
RDW: 12.4 % (ref 11.5–15.5)
WBC: 4.6 10*3/uL (ref 4.0–10.5)

## 2015-02-17 LAB — TSH: TSH: 3.274 u[IU]/mL (ref 0.350–4.500)

## 2015-02-17 MED ORDER — POTASSIUM CHLORIDE CRYS ER 20 MEQ PO TBCR
40.0000 meq | EXTENDED_RELEASE_TABLET | Freq: Once | ORAL | Status: AC
  Administered 2015-02-17: 40 meq via ORAL
  Filled 2015-02-17: qty 2

## 2015-02-17 NOTE — Care Management Obs Status (Signed)
[place] NOTIFICATION   Patient Details  Name: Nathan Howell MRN: [id number] Date of Birth: Mar 20, 1935   Medicare Observation Status Notification Given:  Yes    Zenon Mayo, RN 02/17/2015, 1:59 PM

## 2015-02-17 NOTE — Evaluation (Signed)
Physical Therapy Evaluation Patient Details Name: Nathan Howell MRN: [id number] DOB: 04/19/1934 Today's Date: 02/17/2015   History of Present Illness  Nathan Howell is a 80 y.o. male with PMH of hypertension, hyperlipidemia, diabetes mellitus, COPD, asthma, CAD, CABG, glaucoma, diverticulosis, collagenase colitis, chronic diarrhea, who presents with generalized weakness, headache, lightheadedness.  Clinical Impression  Patient presents with generalized weakness and impaired balance impacting safe mobility. Very slow, guarded gait with need to hold onto rail for support at times for safety. Anticipate mobility and strength will improve with increased activity. Encourage ambulation daily with RN. Independent PTA. Has support from family at home. Will follow acutely to maximize independence and mobility prior to return home.     Follow Up Recommendations No PT follow up;Supervision - Intermittent    Equipment Recommendations  None recommended by PT    Recommendations for Other Services       Precautions / Restrictions Precautions Precautions: Fall Restrictions Weight Bearing Restrictions: No      Mobility  Bed Mobility Overal bed mobility: Modified Independent             General bed mobility comments: Sitting in chair upon PT arrival.   Transfers Overall transfer level: Modified independent Equipment used: None             General transfer comment: Stood from chair x1. No difficulties. Pt was just woken up so initially sleepy.  Ambulation/Gait Ambulation/Gait assistance: Min guard Ambulation Distance (Feet): 250 Feet Assistive device:  (rail in hallway) Gait Pattern/deviations: Step-through pattern;Decreased stride length;Decreased step length - right;Decreased step length - left;Drifts right/left;Staggering right;Staggering left Gait velocity: very decreased   General Gait Details: Mildly unsteady gait with decreased foot clearance RLE. Use of rail in  hallway for support. very slow, guarded gait.  Stairs            Wheelchair Mobility    Modified Rankin (Stroke Patients Only)       Balance Overall balance assessment: Needs assistance Sitting-balance support: Feet supported;No upper extremity supported Sitting balance-Leahy Scale: Good     Standing balance support: During functional activity Standing balance-Leahy Scale: Fair                               Pertinent Vitals/Pain Pain Assessment: No/denies pain    Home Living Family/patient expects to be discharged to:: Private residence Living Arrangements: Spouse/significant other Available Help at Discharge: Family;Available 24 hours/day Type of Home: House Home Access: Stairs to enter   [employer] of Steps: 1 Home Layout: One level Home Equipment: Walker - 2 wheels;Bedside commode;Shower seat;Cane - single point Additional Comments: pt reports he sits in tub for bathing    Prior Function Level of Independence: Independent         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     Hand Dominance   Dominant Hand: Right    Extremity/Trunk Assessment   Upper Extremity Assessment: Defer to OT evaluation           Lower Extremity Assessment: Generalized weakness      Cervical / Trunk Assessment: Normal  Communication   Communication: No difficulties;HOH  Cognition Arousal/Alertness: Awake/alert Behavior During Therapy: WFL for tasks assessed/performed Overall Cognitive Status: Within Functional Limits for tasks assessed                      General Comments      Exercises  Assessment/Plan    PT Assessment Patient needs continued PT services  PT Diagnosis Difficulty walking;Generalized weakness   PT Problem List Decreased strength;Decreased balance;Decreased mobility  PT Treatment Interventions Balance training;Gait training;Stair training;Functional mobility training;Therapeutic  activities;Therapeutic exercise;Patient/family education   PT Goals (Current goals can be found in the Care Plan section) Acute Rehab PT Goals Patient Stated Goal: to go home PT Goal Formulation: With patient Time For Goal Achievement: 03/03/15 Potential to Achieve Goals: Good    Frequency Min 3X/week   Barriers to discharge        Co-evaluation               End of Session Equipment Utilized During Treatment: Gait belt Activity Tolerance: Patient tolerated treatment well Patient left: in chair;with call bell/phone within reach;with chair alarm set Nurse Communication: Mobility status    Functional Assessment Tool Used: clinical judgment Functional Limitation: Mobility: Walking and moving around Mobility: Walking and Moving Around Current Status [phone]): At least 1 percent but less than 20 percent impaired, limited or restricted Mobility: Walking and Moving Around Goal Status [phone]): At least 1 percent but less than 20 percent impaired, limited or restricted    Time: 1141-1201 PT Time Calculation (min) (ACUTE ONLY): 20 min   Charges:   PT Evaluation $Initial PT Evaluation Tier I: 1 Procedure     PT G Codes:   PT G-Codes **NOT FOR INPATIENT CLASS** Functional Assessment Tool Used: clinical judgment Functional Limitation: Mobility: Walking and moving around Mobility: Walking and Moving Around Current Status [id number]): At least 1 percent but less than 20 percent impaired, limited or restricted Mobility: Walking and Moving Around Goal Status [phone]): At least 1 percent but less than 20 percent impaired, limited or restricted    [place] 02/17/2015, 12:15 PM Wray Kearns, [place], DPT [phone]

## 2015-02-17 NOTE — Evaluation (Signed)
Occupational Therapy Evaluation Patient Details Name: Nathan Howell MRN: [id number] DOB: [birth year] Today's Date: 02/17/2015    History of Present Illness Nathan Howell is a 80 y.o. male with PMH of hypertension, hyperlipidemia, diabetes mellitus, COPD, asthma, CAD, CABG, glaucoma, diverticulosis, collagenase colitis, chronic diarrhea, who presents with generalized weakness, headache, lightheadedness.   Clinical Impression   Patient admitted with above. Patient independent PTA. Patient currently functioning at an overall mod I level (taking extra time for safety).  No additional OT needs identified, D/C from acute OT services and no additional follow-up OT needs at this time. All appropriate education provided to patient. Please re-order OT if needed.      Follow Up Recommendations  No OT follow up;Supervision - Intermittent    Equipment Recommendations  None recommended by OT    Recommendations for Other Services  None at this time    Precautions / Restrictions Precautions Precautions: Fall Restrictions Weight Bearing Restrictions: No    Mobility Bed Mobility Overal bed mobility: Modified Independent  Transfers Overall transfer level: Modified independent Equipment used: None    Balance Overall balance assessment: No apparent balance deficits (not formally assessed)    ADL Overall ADL's : At baseline;Modified independent General ADL Comments: Pt takes increased time to complete ADL tasks, but is overall at baseline. Pt ambulated to/from BR without use of AD, managing IV pole to BR and therapist managing IV pole from BR. Pt reports his wife will be there to assist prn.     Pertinent Vitals/Pain Pain Assessment: No/denies pain     Hand Dominance Right   Extremity/Trunk Assessment Upper Extremity Assessment Upper Extremity Assessment: Overall WFL for tasks assessed   Lower Extremity Assessment Lower Extremity Assessment: Defer to PT evaluation   Cervical /  Trunk Assessment Cervical / Trunk Assessment: Normal   Communication Communication Communication: No difficulties   Cognition Arousal/Alertness: Awake/alert Behavior During Therapy: WFL for tasks assessed/performed Overall Cognitive Status: Within Functional Limits for tasks assessed              Home Living Family/patient expects to be discharged to:: Private residence Living Arrangements: Spouse/significant other Available Help at Discharge: Family;Available 24 hours/day Type of Home: House Home Access: Stairs to enter [employer] of Steps: 1   Home Layout: One level     Bathroom Shower/Tub: Teacher, early years/pre: Handicapped height     Home Equipment: Environmental consultant - 2 wheels;Bedside commode;Shower seat   Additional Comments: pt reports he sits in tub for bathing      Prior Functioning/Environment Level of Independence: Independent      OT Diagnosis: Generalized weakness   OT Problem List:  n/a, no acute OT needs identified    OT Treatment/Interventions:   n/a, no acute OT needs identified    OT Goals(Current goals can be found in the care plan section) Acute Rehab OT Goals Patient Stated Goal: go home soon OT Goal Formulation: All assessment and education complete, DC therapy  OT Frequency:  n/a, no acute OT needs identified    Barriers to D/C:  None known at this time    End of Session Activity Tolerance: Patient tolerated treatment well Patient left: in chair;with call bell/phone within reach;with chair alarm set   Time: 1050-1107 OT Time Calculation (min): 17 min Charges:  OT General Charges $OT Visit: 1 Procedure OT Evaluation $Initial OT Evaluation Tier I: 1 Procedure G-Codes: OT G-codes **NOT FOR INPATIENT CLASS** Functional Limitation: Self care Self Care Current Status [id number]): At  least 1 percent but less than 20 percent impaired, limited or restricted Self Care Goal Status [id number]): At least 1 percent but less than 20  percent impaired, limited or restricted Self Care Discharge Status [phone]): At least 1 percent but less than 20 percent impaired, limited or restricted  Curtiss Mahmood , MS, OTR/L, CLT Pager: [phone]  02/17/2015, 12:00 PM

## 2015-02-17 NOTE — Progress Notes (Signed)
[facility] PROGRESS NOTE  Nathan Howell [id number] DOB: [birth year] DOA: 02/16/2015 PCP: Geoffery Lyons, MD   Brief narrative 80 year old male with history of hypertension, hyperlipidemia, diabetes mellitus, COPD, asthma, coronary artery disease with CABG, glaucoma, diverticulosis, collagenous colitis and chronic diarrhea presented with generalized weakness with headache and lightheadedness. Patient reports being scratched by a dog on his right hand 2 weeks back and was started on Augmentin as outpatient but developed severe headache the next day after starting the antibiotic. (Reports taking 10 pills in total). However he had waxing and waning headache with generalized weakness, dizziness, lightheadedness. He also has chronic diarrhea which is unchanged. Patient denied any nausea, vomiting, abdominal pain, fevers or chills. He does report some dysuria but no urinary frequency. In the ED patient's sodium was 124 with normal renal function and leukocytosis. Patient was afebrile with stable vitals. UA was unremarkable. There is no change in his mental status. Head CT showed no evidence of acute intracranial abnormality.   Assessment/Plan: Hyponatremia Possibly associated with GI loss, HCTZ use. No change in mental status. Patient reports drinking a lot of Gatorade at home. HCTZ discontinued. Placed on IV normal saline with fluid restriction. Sodium improving. Follow urine lites. TSH normal.  Chronic diarrhea secondary to collagenous colitis Continue home Lomotil and loperamide  Coronary artery disease status post CABG Asymptomatic. Continue aspirin, beta blocker and Zocor  Essential hypertension Continue metoprolol. Hold HCTZ. When necessary IV hydralazine  Type 2 diabetes mellitus Patient on Levemir, glipizide and metformin. Monitor on low-dose Levemir and sliding scale coverage. Check A1c  Generalized weakness Possibly associated with dehydration and hyponatremia.  Monitor with IV fluids. Seen by PT and no further recommendations.  Headaches Possibly related to Augmentin use. Currently resolved. Head CT unremarkable.  Asthma with COPD Stable. Continue home inhalers  Glaucoma Continue home eyedrops  DVT prophylaxis: sq heparin  Diet: diabetic with fluid restriction.    Code Status: Full code Family Communication: Daughter at bedside Disposition Plan: Home possibly on 11/17 if sodium continues to improve.   Consultants:  none  Procedures:  none  Antibiotics:  none  HPI/Subjective: Seen and examined. Denies headache or lightheadedness  Objective: Filed Vitals:   02/17/15 0621  BP: 140/65  Pulse: 65  Temp: 97.8 F (36.6 C)  Resp: 16    Intake/Output Summary (Last 24 hours) at 02/17/15 1330 Last data filed at 02/17/15 0924  Gross per 24 hour  Intake   1825 ml  Output   1450 ml  Net    375 ml   Filed Weights   02/16/15 1442 02/16/15 2022  Weight: 90.719 kg (200 lb) 84.641 kg (186 lb 9.6 oz)    Exam:   General: elderly male not in distress  HEENT: No pallor, moist oral mucosa  Chest: Clear to auscultation bilaterally  CVS: Normal S1 and S2, no murmurs  GI: Soft, nondistended, nontender, bowel sounds present  Musculoskeletal: Warm, no edema  CNS: Alert and oriented    Data Reviewed: Basic Metabolic Panel:  Recent Labs Lab 02/16/15 1505 02/17/15 0525  NA 124* 129*  K 3.5 3.5  CL 90* 96*  CO2 28 26  GLUCOSE 126* 97  BUN 9 6  CREATININE 0.63 0.66  CALCIUM 8.8* 8.5*   Liver Function Tests:  Recent Labs Lab 02/16/15 1505  AST 15  ALT 12*  ALKPHOS 44  BILITOT 0.8  PROT 7.2  ALBUMIN 4.0   No results for input(s): LIPASE, AMYLASE in the last 168 hours. No results for  input(s): AMMONIA in the last 168 hours. CBC:  Recent Labs Lab 02/16/15 1505 02/17/15 0525  WBC 7.8 4.6  HGB 13.4 12.4*  HCT 37.7* 34.0*  MCV 88.5 87.6  PLT 282 236   Cardiac Enzymes:  Recent Labs Lab  02/16/15 1505  TROPONINI <0.03   BNP (last 3 results) No results for input(s): BNP in the last 8760 hours.  ProBNP (last 3 results) No results for input(s): PROBNP in the last 8760 hours.  CBG:  Recent Labs Lab 02/16/15 2210 02/17/15 0747 02/17/15 1224  GLUCAP 113* 109* 113*    No results found for this or any previous visit (from the past 240 hour(s)).   Studies: Dg Chest 2 View  02/16/2015  CLINICAL DATA:  Allergic reaction to medicine with body weakness and soreness. EXAM: CHEST  2 VIEW COMPARISON:  04/17/2013 FINDINGS: Mild elevation of the right hemidiaphragm. Both lungs are clear. Heart size is normal. Evidence of prior CABG procedure. No pleural effusions. IMPRESSION: No active cardiopulmonary disease. Electronically Signed   By: Markus Daft M.D.   On: 02/16/2015 15:33   Ct Head Wo Contrast  02/16/2015  CLINICAL DATA:  Headache.  Vision changes.  Weakness. EXAM: CT HEAD WITHOUT CONTRAST TECHNIQUE: Contiguous axial images were obtained from the base of the skull through the vertex without intravenous contrast. COMPARISON:  None. FINDINGS: No evidence of parenchymal hemorrhage or extra-axial fluid collection. No mass lesion, mass effect, or midline shift. No CT evidence of acute infarction. There is focal encephalomalacia in the posterior left frontal lobe. There is intracranial atherosclerosis. There is mild generalized cerebral volume loss. Nonspecific mild subcortical and periventricular white matter hypodensity, most in keeping with chronic small vessel ischemic change. No ventriculomegaly. There is partial opacification of the ethmoidal air cells and frontal sinus bilaterally, right greater than left. The mastoid air cells are unopacified. No evidence of calvarial fracture. IMPRESSION: 1.  No evidence of acute intracranial abnormality. 2. Posterior left frontal lobe encephalomalacia, possibly from remote CVA. 3. Intracranial atherosclerosis, mild generalized cerebral volume loss  and mild chronic small vessel ischemic white matter change. 4. Paranasal sinusitis. Electronically Signed   By: Ilona Sorrel M.D.   On: 02/16/2015 15:46    Scheduled Meds: . aspirin EC  81 mg Oral Daily  . brimonidine  1 drop Right Eye BID  . budesonide (PULMICORT) nebulizer solution  0.25 mg Nebulization BID  . diphenhydrAMINE  25 mg Oral Q6H  . dorzolamide  1 drop Right Eye BID  . enoxaparin (LOVENOX) injection  40 mg Subcutaneous Q24H  . insulin aspart  0-9 Units Subcutaneous TID WC  . insulin detemir  20 Units Subcutaneous Daily  . losartan  100 mg Oral Daily  . metoprolol tartrate  25 mg Oral BID  . simvastatin  20 mg Oral Daily   Continuous Infusions: . sodium chloride 1,000 mL (02/17/15 0800)      Time spent: 25 minutes    Rikita Grabert, [place]  [facility] Pager [phone]. If 7PM-7AM, please contact night-coverage at [email or web address], password [facility] 02/17/2015, 1:30 PM  LOS: 1 day

## 2015-02-17 NOTE — Care Management Note (Signed)
Case Management Note  Patient Details  Name: VIRGIE DAMER MRN: [id number] Date of Birth: 01/26/1935  Subjective/Objective:       Date: 02/17/15 Spoke with patient at the bedside along with Mrs Kenderdine.  Introduced self as Tourist information centre manager and explained role in discharge planning and how to be reached.  Verified patient lives in town, with spouse.   Has DME rolling walker, bsc, and shower seat at home..  Expressed potential need for no other DME.  Verified patient anticipates to go home with family, at time of discharge and will have full-time supervision by family  at this time to best of their knowledge. Patient denied needing help with their medication.  Patient drives or is driven by wife to MD appointments.  Verified patient has PCP Dr. Burnard Bunting.  Per physical therapy eval, no f/u needed.  Plan: CM will continue to follow for discharge planning and [facility] resources.              Action/Plan:   Expected Discharge Date:                  Expected Discharge Plan:  Home/Self Care  In-House Referral:     Discharge planning Services  CM Consult  Post Acute Care Choice:    Choice offered to:     DME Arranged:    DME Agency:     HH Arranged:    [place] Agency:     Status of Service:  Completed, signed off  Medicare Important Message Given:    Date Medicare IM Given:    Medicare IM give by:    Date Additional Medicare IM Given:    Additional Medicare Important Message give by:     If discussed at [place] of Stay Meetings, dates discussed:    Additional Comments:  Zenon Mayo, RN 02/17/2015, 3:52 PM

## 2015-02-18 DIAGNOSIS — E871 Hypo-osmolality and hyponatremia: Secondary | ICD-10-CM | POA: Diagnosis not present

## 2015-02-18 DIAGNOSIS — R51 Headache: Secondary | ICD-10-CM | POA: Diagnosis not present

## 2015-02-18 DIAGNOSIS — E119 Type 2 diabetes mellitus without complications: Secondary | ICD-10-CM

## 2015-02-18 DIAGNOSIS — Z794 Long term (current) use of insulin: Secondary | ICD-10-CM | POA: Diagnosis not present

## 2015-02-18 DIAGNOSIS — R531 Weakness: Secondary | ICD-10-CM | POA: Diagnosis not present

## 2015-02-18 DIAGNOSIS — E0851 Diabetes mellitus due to underlying condition with diabetic peripheral angiopathy without gangrene: Secondary | ICD-10-CM | POA: Diagnosis not present

## 2015-02-18 LAB — BASIC METABOLIC PANEL
ANION GAP: 6 (ref 5–15)
BUN: 5 mg/dL — ABNORMAL LOW (ref 6–20)
CHLORIDE: 101 mmol/L (ref 101–111)
CO2: 22 mmol/L (ref 22–32)
Calcium: 8.2 mg/dL — ABNORMAL LOW (ref 8.9–10.3)
Creatinine, Ser: 0.63 mg/dL (ref 0.61–1.24)
GFR calc non Af Amer: >60 mL/min (ref >60)
GLUCOSE: 99 mg/dL (ref 65–99)
POTASSIUM: 3.7 mmol/L (ref 3.5–5.1)
Sodium: 129 mmol/L — ABNORMAL LOW (ref 135–145)

## 2015-02-18 LAB — GLUCOSE, CAPILLARY: Glucose-Capillary: 100 mg/dL — ABNORMAL HIGH (ref 65–99)

## 2015-02-18 LAB — HEMOGLOBIN A1C
Hgb A1c MFr Bld: 6.2 % — ABNORMAL HIGH (ref 4.8–5.6)
Mean Plasma Glucose: 131 mg/dL

## 2015-02-18 MED ORDER — INSULIN DETEMIR 100 UNIT/ML FLEXPEN: 20.0000 [IU] | PEN_INJECTOR | Freq: Every day | SUBCUTANEOUS | Status: DC

## 2015-02-18 NOTE — Progress Notes (Signed)
Nathan Howell to be D/C'd Home per MD order.  Discussed with the patient and all questions fully answered.  VSS, Skin clean, dry and intact without evidence of skin break down, no evidence of skin tears noted. IV catheter discontinued intact. Site without signs and symptoms of complications. Dressing and pressure applied.  An After Visit Summary was printed and given to the patient. Patient received prescription.  D/c education completed with patient/family including follow up instructions, medication list, d/c activities limitations if indicated, with other d/c instructions as indicated by MD - patient able to verbalize understanding, all questions fully answered.   Patient instructed to return to ED, call 911, or call MD for any changes in condition.   Patient escorted via [place], and D/C home via private auto.  [name] 02/18/2015 10:44 AM

## 2015-02-18 NOTE — Discharge Summary (Addendum)
Physician Discharge Summary  Nathan Howell [id number] DOB: Dec 03, 1934 DOA: 02/16/2015  PCP: Geoffery Lyons, MD  Admit date: 02/16/2015 Discharge date: 02/18/2015  Time spent: 25 minutes  Recommendations for Outpatient Follow-up:  1. Discharge home with PCP follow-up in 1 week. Recheck sodium level dating outpatient visit. 2.  i have reduced his levemir dose to 20 units daily. Please adjust medications further during outpt follow up if fsg stable or low normal.    Discharge Diagnoses:  Principal Problem:   Hyponatremia   Active Problems:   Diarrhea   CAD (coronary artery disease)   HTN (hypertension)   Hyperlipidemia with target LDL less than 70   Generalized weakness   Headache   Asthma   COPD (chronic obstructive pulmonary disease) ([place])   Glaucoma   Diabetes mellitus without complication ([place])   Discharge Condition: Fair  Diet recommendation: Diabetic  Filed Weights   02/16/15 1442 02/16/15 2022  Weight: 90.719 kg (200 lb) 84.641 kg (186 lb 9.6 oz)    History of present illness:  Reason for to admission H&P for details, in brief,80 year old male with history of hypertension, hyperlipidemia, diabetes mellitus, COPD, asthma, coronary artery disease with CABG, glaucoma, diverticulosis, collagenous colitis and chronic diarrhea presented with generalized weakness with headache and lightheadedness. Patient reports being scratched by a dog on his right hand 2 weeks back and was started on Augmentin as outpatient but developed severe headache the next day after starting the antibiotic. (Reports taking 10 pills in total). However he had waxing and waning headache with generalized weakness, dizziness, lightheadedness. He also has chronic diarrhea which is unchanged. Patient denied any nausea, vomiting, abdominal pain, fevers or chills. He does report some dysuria but no urinary frequency. In the ED patient's sodium was 124 with normal renal function and leukocytosis.  Patient was afebrile with stable vitals. UA was unremarkable. There is no change in his mental status. Head CT showed no evidence of acute intracranial abnormality.  Hospital Course:  Hyponatremia Possibly associated with GI loss, HCTZ use. No change in mental status. Patient reports drinking a lot of Gatorade at home. HCTZ discontinued. Placed on IV normal saline with fluid restriction. Sodium improving to 129 today. urine lytes pending. Serum osm was low on admission.  symptoms have completely resolved.[name] He is stable to be discharged home and outpatient PCP follow-up.  TSH normal.  Chronic diarrhea secondary to collagenous colitis Continue home Lomotil and loperamide.  Coronary artery disease status post CABG Asymptomatic. Continue aspirin, beta blocker and Zocor  Essential hypertension Continue metoprolol. Held HCTZ.   Type 2 diabetes mellitus Patient on Levemir, glipizide and metformin. A1c of 6.2. i have reduced his levemir dose to 20 units daily. Please adjust medications further during outpt follow up if fsg stable or low normal.  Generalized weakness Possibly associated with dehydration and hyponatremia. Resolved with IV hydration. Seen by PT and no further recommendations.  Headaches Possibly related to Augmentin use. Currently resolved. Head CT unremarkable.  Asthma with COPD Stable. Continue home inhalers  Glaucoma Continue home eyedrops      Code Status: Full code Family Communication: Scratched with son Coralyn Mark on the phone Disposition Plan: Home with outpatient follow-up   Consultants:  none  Procedures:  none  Antibiotics:  none    Discharge Exam: Filed Vitals:   02/18/15 0519  BP: 142/69  Pulse: 65  Temp: 98.6 F (37 C)  Resp: 18     General: elderly male not in distress  HEENT: No pallor, moist oral  mucosa  Chest: Clear to auscultation bilaterally  CVS: Normal S1 and S2, no murmurs  GI: Soft, nondistended, nontender, bowel sounds  present  Musculoskeletal: Warm, no edema  CNS: Alert and oriented  Discharge Instructions    Current Discharge Medication List    CONTINUE these medications which have NOT CHANGED   Details  ALPHAGAN P 0.1 % SOLN Place 1 drop into the right eye 2 (two) times daily.    aspirin 81 MG tablet Take 1 tablet by mouth daily.    beclomethasone (QVAR) 80 MCG/ACT inhaler Inhale 2 puffs into the lungs 2 (two) times daily.    chlorpheniramine (CHLOR-TRIMETON) 4 MG tablet Take 8 mg by mouth at bedtime.     dorzolamide (TRUSOPT) 2 % ophthalmic solution Place 1 drop into the right eye 2 (two) times daily.      glipiZIDE (GLUCOTROL) 5 MG tablet Take 5 mg by mouth 2 (two) times daily before a meal.     LEVEMIR FLEXTOUCH 100 UNIT/ML Pen Inject 20 Units into the skin daily. Or as directed    loperamide (IMODIUM) 2 MG capsule Take by mouth as needed for diarrhea or loose stools.    losartan (COZAAR) 100 MG tablet Take 100 mg by mouth at bedtime.     metFORMIN (GLUCOPHAGE) 1000 MG tablet Take 1,000 mg by mouth 2 (two) times daily with a meal.     metoprolol tartrate (LOPRESSOR) 25 MG tablet Take 1 tablet (25 mg total) by mouth 2 (two) times daily. Qty: 60 tablet, Refills: 6    simvastatin (ZOCOR) 20 MG tablet Take 1 tablet by mouth daily.      STOP taking these medications     hydrochlorothiazide (MICROZIDE) 12.5 MG capsule      FLUZONE HIGH-DOSE 0.5 ML SUSY      diphenoxylate-atropine (LOMOTIL) 2.5-0.025 MG per tablet        No Known Allergies Follow-up Information    Follow up with ARONSON,RICHARD A, MD. Schedule an appointment as soon as possible for a visit in 1 week.   Specialty:  Internal Medicine   Contact information:   [place] [address] [phone]        The results of significant diagnostics from this hospitalization (including imaging, microbiology, ancillary and laboratory) are listed below for reference.    Significant Diagnostic  Studies: Dg Chest 2 View  02/16/2015  CLINICAL DATA:  Allergic reaction to medicine with body weakness and soreness. EXAM: CHEST  2 VIEW COMPARISON:  04/17/2013 FINDINGS: Mild elevation of the right hemidiaphragm. Both lungs are clear. Heart size is normal. Evidence of prior CABG procedure. No pleural effusions. IMPRESSION: No active cardiopulmonary disease. Electronically Signed   By: Markus Daft M.D.   On: 02/16/2015 15:33   Ct Head Wo Contrast  02/16/2015  CLINICAL DATA:  Headache.  Vision changes.  Weakness. EXAM: CT HEAD WITHOUT CONTRAST TECHNIQUE: Contiguous axial images were obtained from the base of the skull through the vertex without intravenous contrast. COMPARISON:  None. FINDINGS: No evidence of parenchymal hemorrhage or extra-axial fluid collection. No mass lesion, mass effect, or midline shift. No CT evidence of acute infarction. There is focal encephalomalacia in the posterior left frontal lobe. There is intracranial atherosclerosis. There is mild generalized cerebral volume loss. Nonspecific mild subcortical and periventricular white matter hypodensity, most in keeping with chronic small vessel ischemic change. No ventriculomegaly. There is partial opacification of the ethmoidal air cells and frontal sinus bilaterally, right greater than left. The mastoid air cells  are unopacified. No evidence of calvarial fracture. IMPRESSION: 1.  No evidence of acute intracranial abnormality. 2. Posterior left frontal lobe encephalomalacia, possibly from remote CVA. 3. Intracranial atherosclerosis, mild generalized cerebral volume loss and mild chronic small vessel ischemic white matter change. 4. Paranasal sinusitis. Electronically Signed   By: Ilona Sorrel M.D.   On: 02/16/2015 15:46    Microbiology: No results found for this or any previous visit (from the past 240 hour(s)).   Labs: Basic Metabolic Panel:  Recent Labs Lab 02/16/15 1505 02/17/15 0525 02/18/15 0647  NA 124* 129* 129*  K 3.5  3.5 3.7  CL 90* 96* 101  CO2 28 26 22   GLUCOSE 126* 97 99  BUN 9 6 5*  CREATININE 0.63 0.66 0.63  CALCIUM 8.8* 8.5* 8.2*   Liver Function Tests:  Recent Labs Lab 02/16/15 1505  AST 15  ALT 12*  ALKPHOS 44  BILITOT 0.8  PROT 7.2  ALBUMIN 4.0   No results for input(s): LIPASE, AMYLASE in the last 168 hours. No results for input(s): AMMONIA in the last 168 hours. CBC:  Recent Labs Lab 02/16/15 1505 02/17/15 0525  WBC 7.8 4.6  HGB 13.4 12.4*  HCT 37.7* 34.0*  MCV 88.5 87.6  PLT 282 236   Cardiac Enzymes:  Recent Labs Lab 02/16/15 1505  TROPONINI <0.03   BNP: BNP (last 3 results) No results for input(s): BNP in the last 8760 hours.  ProBNP (last 3 results) No results for input(s): PROBNP in the last 8760 hours.  CBG:  Recent Labs Lab 02/17/15 0747 02/17/15 1224 02/17/15 1704 02/17/15 2137 02/18/15 0821  GLUCAP 109* 113* 112* 147* 100*       Signed:  Ivanna Kocak  [facility] 02/18/2015, 9:25 AM

## 2015-02-18 NOTE — Care Management Note (Signed)
Case Management Note  Patient Details  Name: Nathan Howell MRN: [id number] Date of Birth: 1934/07/09  Subjective/Objective:   Patient is for dc today to home with wife, no needs.                 Action/Plan:   Expected Discharge Date:                  Expected Discharge Plan:  Home/Self Care  In-House Referral:     Discharge planning Services  CM Consult  Post Acute Care Choice:    Choice offered to:     DME Arranged:    DME Agency:     HH Arranged:    [place] Agency:     Status of Service:  Completed, signed off  Medicare Important Message Given:    Date Medicare IM Given:    Medicare IM give by:    Date Additional Medicare IM Given:    Additional Medicare Important Message give by:     If discussed at [place] of Stay Meetings, dates discussed:    Additional Comments:  Zenon Mayo, RN 02/18/2015, 2:57 PM

## 2015-02-18 NOTE — Discharge Instructions (Signed)
Hyponatremia °Hyponatremia is when the amount of salt (sodium) in your blood is too low. When sodium levels are low, your cells absorb extra water and they swell. The swelling happens throughout the body, but it mostly affects the brain. °CAUSES °This condition may be caused by: °· Heart, kidney, or liver problems. °· Thyroid problems. °· Adrenal gland problems. °· Metabolic conditions, such as syndrome of inappropriate antidiuretic hormone (SIADH). °· Severe vomiting and diarrhea. °· Certain medicines or illegal drugs. °· Dehydration. °· Drinking too much water. °· Eating a diet that is low in sodium. °· Large burns on your body. °· Sweating. °RISK FACTORS °This condition is more likely to develop in people who: °· Have long-term (chronic) kidney disease. °· Have heart failure. °· Have a medical condition that causes frequent or excessive diarrhea. °· Have metabolic conditions, such as Addison disease or SIADH. °· Take certain medicines that affect the sodium and fluid balance in the blood. Some of these medicine types include: °¨ Diuretics. °¨ NSAIDs. °¨ Some opioid pain medicines. °¨ Some antidepressants. °¨ Some seizure prevention medicines. °SYMPTOMS  °Symptoms of this condition include: °· Nausea and vomiting. °· Confusion. °· Lethargy. °· Agitation. °· Headache. °· Seizures. °· Unconsciousness. °· Appetite loss. °· Muscle weakness and cramping. °· Feeling weak or light-headed. °· Having a rapid heart rate. °· Fainting, in severe cases. °DIAGNOSIS °This condition is diagnosed with a medical history and physical exam. You will also have other tests, including: °· Blood tests. °· Urine tests. °TREATMENT °Treatment for this condition depends on the cause. Treatment may include: °· Fluids given through an IV tube that is inserted into one of your veins. °· Medicines to correct the sodium imbalance. If medicines are causing the condition, the medicines will need to be adjusted. °· Limiting water or fluid intake to  get the correct sodium balance. °HOME CARE INSTRUCTIONS °· Take medicines only as directed by your health care provider. Many medicines can make this condition worse. Talk with your health care provider about any medicines that you are currently taking. °· Carefully follow a recommended diet as directed by your health care provider. °· Carefully follow instructions from your health care provider about fluid restrictions. °· Keep all follow-up visits as directed by your health care provider. This is important. °· Do not drink alcohol. °SEEK MEDICAL CARE IF: °· You develop worsening nausea, fatigue, headache, confusion, or weakness. °· Your symptoms go away and then return. °· You have problems following the recommended diet. °SEEK IMMEDIATE MEDICAL CARE IF: °· You have a seizure. °· You faint. °· You have ongoing diarrhea or vomiting. °  °This information is not intended to replace advice given to you by your health care provider. Make sure you discuss any questions you have with your health care provider. °  °Document Released: 03/10/2002 Document Revised: 08/04/2014 Document Reviewed: 04/09/2014 °Elsevier Interactive Patient Education ©2016 [employer] ° ° ° °

## 2015-02-18 NOTE — Progress Notes (Signed)
Physical Therapy Treatment Patient Details Name: AVYANSH BREER MRN: [id number] DOB: [birth year] Today's Date: 02/18/2015    History of Present Illness DELL SANTELLAN is a 80 y.o. male with PMH of hypertension, hyperlipidemia, diabetes mellitus, COPD, asthma, CAD, CABG, glaucoma, diverticulosis, collagenase colitis, chronic diarrhea, who presents with generalized weakness, headache, lightheadedness.    PT Comments    Patient progressing well towards PT goals. More steady than previous session. Mildly unsteady at times but no overt LOB. Reports gait as baseline. Declines use of AD. Pt eager to return home today. Pt will have support from son and wife at home per pt report. Will follow acutely per current POC.   Follow Up Recommendations  No PT follow up;Supervision - Intermittent     Equipment Recommendations  None recommended by PT    Recommendations for Other Services       Precautions / Restrictions Precautions Precautions: Fall Restrictions Weight Bearing Restrictions: No    Mobility  Bed Mobility               General bed mobility comments: Sitting in chair upon PT arrival.   Transfers Overall transfer level: Modified independent Equipment used: None             General transfer comment: Stood from chair x1. No difficulties.   Ambulation/Gait Ambulation/Gait assistance: Supervision Ambulation Distance (Feet): 250 Feet Assistive device: None Gait Pattern/deviations: Step-through pattern;Decreased stride length;Shuffle   Gait velocity interpretation: <1.8 ft/sec, indicative of risk for recurrent falls General Gait Details: Shuffling gait pattern at times RLE>LLE. Not having to reach for rail in hallway as much today. Unsteady at times but no overt LOB.   Stairs            Wheelchair Mobility    Modified Rankin (Stroke Patients Only)       Balance Overall balance assessment: Needs assistance Sitting-balance support: Feet supported;No  upper extremity supported Sitting balance-Leahy Scale: Good     Standing balance support: During functional activity Standing balance-Leahy Scale: Fair                      Cognition Arousal/Alertness: Awake/alert Behavior During Therapy: WFL for tasks assessed/performed Overall Cognitive Status: Within Functional Limits for tasks assessed                      Exercises      General Comments        Pertinent Vitals/Pain Pain Assessment: No/denies pain    Home Living                      Prior Function            PT Goals (current goals can now be found in the care plan section) Progress towards PT goals: Progressing toward goals    Frequency  Min 3X/week    PT Plan Current plan remains appropriate    Co-evaluation             End of Session Equipment Utilized During Treatment: Gait belt Activity Tolerance: Patient tolerated treatment well Patient left: in chair;with call bell/phone within reach;with chair alarm set     Time: 0850-0910 PT Time Calculation (min) (ACUTE ONLY): 20 min  Charges:  $Gait Training: 8-22 mins                    G Codes:      Elias Bordner A Genevia Bouldin 02/18/2015, 9:31 AM [id number]  Sabra Heck, [place], [place]

## 2015-02-19 DIAGNOSIS — E871 Hypo-osmolality and hyponatremia: Secondary | ICD-10-CM | POA: Diagnosis not present

## 2015-03-19 DIAGNOSIS — I10 Essential (primary) hypertension: Secondary | ICD-10-CM | POA: Diagnosis not present

## 2015-03-19 DIAGNOSIS — E119 Type 2 diabetes mellitus without complications: Secondary | ICD-10-CM | POA: Diagnosis not present

## 2015-03-19 DIAGNOSIS — E785 Hyperlipidemia, unspecified: Secondary | ICD-10-CM | POA: Diagnosis not present

## 2015-03-19 DIAGNOSIS — E871 Hypo-osmolality and hyponatremia: Secondary | ICD-10-CM | POA: Diagnosis not present

## 2015-03-22 ENCOUNTER — Other Ambulatory Visit: Payer: Self-pay | Admitting: Cardiovascular Disease

## 2015-04-15 DIAGNOSIS — E119 Type 2 diabetes mellitus without complications: Secondary | ICD-10-CM | POA: Diagnosis not present

## 2015-04-15 DIAGNOSIS — Z0000 Encounter for general adult medical examination without abnormal findings: Secondary | ICD-10-CM | POA: Diagnosis not present

## 2015-04-15 DIAGNOSIS — E785 Hyperlipidemia, unspecified: Secondary | ICD-10-CM | POA: Diagnosis not present

## 2015-04-22 DIAGNOSIS — E119 Type 2 diabetes mellitus without complications: Secondary | ICD-10-CM | POA: Diagnosis not present

## 2015-04-22 DIAGNOSIS — Z0000 Encounter for general adult medical examination without abnormal findings: Secondary | ICD-10-CM | POA: Diagnosis not present

## 2015-04-22 DIAGNOSIS — I10 Essential (primary) hypertension: Secondary | ICD-10-CM | POA: Diagnosis not present

## 2015-04-22 DIAGNOSIS — Z6827 Body mass index (BMI) 27.0-27.9, adult: Secondary | ICD-10-CM | POA: Diagnosis not present

## 2015-04-22 DIAGNOSIS — E871 Hypo-osmolality and hyponatremia: Secondary | ICD-10-CM | POA: Diagnosis not present

## 2015-04-22 DIAGNOSIS — J449 Chronic obstructive pulmonary disease, unspecified: Secondary | ICD-10-CM | POA: Diagnosis not present

## 2015-04-22 DIAGNOSIS — Z1389 Encounter for screening for other disorder: Secondary | ICD-10-CM | POA: Diagnosis not present

## 2015-04-22 DIAGNOSIS — E784 Other hyperlipidemia: Secondary | ICD-10-CM | POA: Diagnosis not present

## 2015-06-08 DIAGNOSIS — E119 Type 2 diabetes mellitus without complications: Secondary | ICD-10-CM | POA: Diagnosis not present

## 2015-06-08 DIAGNOSIS — H2513 Age-related nuclear cataract, bilateral: Secondary | ICD-10-CM | POA: Diagnosis not present

## 2015-06-08 DIAGNOSIS — H538 Other visual disturbances: Secondary | ICD-10-CM | POA: Diagnosis not present

## 2015-06-21 ENCOUNTER — Ambulatory Visit (INDEPENDENT_AMBULATORY_CARE_PROVIDER_SITE_OTHER): Payer: Medicare HMO | Admitting: Cardiovascular Disease

## 2015-06-21 VITALS — BP 126/72 | HR 68 | Ht 70.0 in | Wt 193.4 lb

## 2015-06-21 DIAGNOSIS — E785 Hyperlipidemia, unspecified: Secondary | ICD-10-CM | POA: Diagnosis not present

## 2015-06-21 DIAGNOSIS — E119 Type 2 diabetes mellitus without complications: Secondary | ICD-10-CM

## 2015-06-21 DIAGNOSIS — I2510 Atherosclerotic heart disease of native coronary artery without angina pectoris: Secondary | ICD-10-CM | POA: Diagnosis not present

## 2015-06-21 DIAGNOSIS — I10 Essential (primary) hypertension: Secondary | ICD-10-CM

## 2015-06-21 DIAGNOSIS — J4520 Mild intermittent asthma, uncomplicated: Secondary | ICD-10-CM

## 2015-06-21 MED ORDER — METOPROLOL TARTRATE 25 MG PO TABS
25.0000 mg | ORAL_TABLET | Freq: Two times a day (BID) | ORAL | Status: DC
Start: 2015-06-21 — End: 2016-06-16

## 2015-06-21 NOTE — Patient Instructions (Signed)
Your physician wants you to follow-up in: November after Stress test. You will receive a reminder letter in the mail two months in advance. If you don't receive a letter, please call our office to schedule the follow-up appointment.  Your physician has requested that you have en exercise stress myoview in November. For further information please visit [email or web address]. Please follow instruction sheet, as given.

## 2015-06-22 ENCOUNTER — Encounter: Payer: Self-pay | Admitting: Cardiovascular Disease

## 2015-06-22 NOTE — Progress Notes (Signed)
Patient ID: Nathan Howell, male   DOB: July 31, 1934, 81 y.o.   MRN: [id number]    Primary M.D.: Dr. Burnard Bunting  HPI: Nathan Howell is a 81 y.o. male who presents for a 10 month cardiology evaluation.  Nathan Howell has known CAD and on11/23/2005 underwent CABG surgery x3 by Dr. Servando Snare (LIMA to the LAD, vein to the intermediate, and into the distal circumflex). [name] was nondominant and not bypassed. Additional problems include type 2 diabetes mellitus, hypertension, as well as hyperlipidemia. His last nuclear perfusion study in 2012 which showed significant diaphragmatic attenuation but was without ischemia. Ejection fraction 65%. An echo Doppler study in November 2012 showed moderate concentric LVH with normal systolic function grade 1 diastolic dysfunction. He did have moderate LA dilatation, mild mitral annular calcification trace MR, trace TR, and aortic valve sclerosis.  Nathan Howell has continued to be stable from a cardiac standpoint. He denies recent episodes of chest pain. He remains active.  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  He also does all his own yard work.  He denies  palpitations. He denies presyncope or syncope.    He had undergone colonoscopy by Dr. Henrene Pastor and was found to have having diverticulitis.  He states his significant diarrhea markedly improved with a significant change in his diet.  He hasn't maintained on amlodipine 5 mg, losartan 100 mg daily, metoprolol 25 mg twice a day for his blood pressure.  He has continued to be on simvastatin 20 mg daily for hyperlipidemia.  He is diabetic on Levemir, metformin and glipizide.  He presents for evaluation.   Past Medical History  Diagnosis Date  . Hyperlipidemia   . Hypertension   . Rhinitis medicamentosa     per remote [place] w/u  . Glaucoma   . GERD (gastroesophageal reflux disease)   . Coronary artery disease   . History of stress test 02/2011    Which showed mild inferior thinning  felt to be diaphragmatic attenuation artifact without ichemia. Poststress EF was 65%.  [name] Hx of echocardiogram     EF>55% this showed moderate concentric LVH with normal systolic function. There was evidence for grade 1 diastolic relaxation abnormality with possible very small region of apical lateral hypokinesis.He did have moderate left atrial dilatation by volume assessment. There was mild mitral annular calcification with trace MR, trace TR, mild aortic valve sclerosis without stenosis    . Abdominal hernia   . Diverticulosis   . Collagenous colitis   . Asthma     [place] - HFA 50% 03/16/09 > 50% 05/13/10  . COPD (chronic obstructive pulmonary disease) ([place]) dx'd ~ 2015  . Type II diabetes mellitus [facility])     Past Surgical History  Procedure Laterality Date  . Coronary artery bypass graft      x3 By Dr Servando Snare with LIMA to the LAD, a vein to the intermediate, a vein to the distal circumflex. [name] was nondominant and not bypassed.  . Knee arthroscopy Bilateral   . Inguinal hernia repair  1999    "not sure which side"  . Cataract extraction w/ intraocular lens implant Right 2003   . Hand surgery Right     "don't remember what was done"  . Corneal transplant Right ~ 2004  . Glaucoma valve insertion Right   . Cardiac catheterization  02/2004    Normal left ventricular function. Significant multivessel coronary artery disease corresponding to left main equivalent disease with diffuse tubular narrowing  of 50% in the proximal left anterior descending, 905 in the ramus intermadius vessel and 80% in the most proximal circumflex veseel with 70% mid A-V groove circumflex stenosis.  . Coronary angioplasty      No Known Allergies  Current Outpatient Prescriptions  Medication Sig Dispense Refill  . ALPHAGAN P 0.1 % SOLN Place 1 drop into the right eye 2 (two) times daily.    [name] amLODipine (NORVASC) 5 MG tablet Take 5 mg by mouth daily.    [name] aspirin 81 MG tablet Take 1 tablet by mouth daily.      . beclomethasone (QVAR) 80 MCG/ACT inhaler Inhale 2 puffs into the lungs 2 (two) times daily.    . chlorpheniramine (CHLOR-TRIMETON) 4 MG tablet Take 8 mg by mouth at bedtime.     . dorzolamide (TRUSOPT) 2 % ophthalmic solution Place 1 drop into the right eye 2 (two) times daily.      [name] glipiZIDE (GLUCOTROL) 5 MG tablet Take 5 mg by mouth 2 (two) times daily before a meal.     . Insulin Detemir (LEVEMIR FLEXTOUCH) 100 UNIT/ML Pen Inject 20 Units into the skin daily. Or as directed 15 mL 11  . losartan (COZAAR) 100 MG tablet Take 100 mg by mouth daily.    . metFORMIN (GLUCOPHAGE) 1000 MG tablet Take 1,000 mg by mouth 2 (two) times daily with a meal.     . metoprolol tartrate (LOPRESSOR) 25 MG tablet Take 1 tablet (25 mg total) by mouth 2 (two) times daily. 180 tablet 3  . simvastatin (ZOCOR) 20 MG tablet Take 1 tablet by mouth daily.     No current facility-administered medications for this visit.    Social History   Social History  . Marital Status: Married    Spouse Name: N/A  . Number of Children: 3  . Years of Education: N/A   Occupational History  . Retired    Social History Main Topics  . Smoking status: Never Smoker   . Smokeless tobacco: Current User    Types: Snuff  . Alcohol Use: No  . Drug Use: No  . Sexual Activity: Not Currently   Other Topics Concern  . Not on file   Social History Narrative   Never smoked cigs regularly but stopped all exposure 44s   Lives with wife    Family History  Problem Relation Age of Onset  . Asthma Mother   . Emphysema Father   . Colon cancer Neg Hx    Social history is normal and that he is married has 3 children 4 grandchildren. He does try to exercise and remains active. There is no tobacco or alcohol use.  ROS General: Negative; No fevers, chills, or night sweats;  HEENT: Negative; No changes in vision or hearing, sinus congestion, difficulty swallowing Pulmonary: Negative; No cough, wheezing, shortness of breath,  hemoptysis Cardiovascular: Negative; No chest pain, presyncope, syncope, palpitations GI: As if her recent diarrhea and diverticular disease. GU: Negative; No dysuria, hematuria, or difficulty voiding Musculoskeletal: Negative; no myalgias, joint pain, or weakness Hematologic/Oncology: Negative; no easy bruising, bleeding Endocrine: Negative; no heat/cold intolerance; no diabetes Neuro: Negative; no changes in balance, headaches Skin: Negative; No rashes or skin lesions Psychiatric: Negative; No behavioral problems, depression Sleep: Negative; No snoring, daytime sleepiness, hypersomnolence, bruxism, restless legs, hypnogognic hallucinations, no cataplexy Other comprehensive 14 point system review is negative.  PE BP 126/72 mmHg  Pulse 68  Ht _0  (1.778 m)  Wt 193 lb 6.4 oz (87.726  kg)  BMI 27.75 kg/m2  Wt Readings from Last 3 Encounters:  06/21/15 193 lb 6.4 oz (87.726 kg)  02/16/15 186 lb 9.6 oz (84.641 kg)  08/10/14 199 lb 9.6 oz (90.538 kg)   General: Alert, oriented, no distress.  Skin: normal turgor, no rashes HEENT: Normocephalic, atraumatic. Pupils round and reactive; sclera anicteric;no lid lag.  Nose without nasal septal hypertrophy Mouth/Parynx benign; Mallinpatti scale 2 Neck: No JVD, no carotid bruits with normal carotid upstroke Lungs: clear to ausculatation and percussion; no wheezing or rales Chest wall: Nontender to palpation Heart: RRR, s1 s2 normal 1/6 systolic murmur; .  No diastolic murmur.  No rubs thrills or heaves Abdomen: soft, nontender; no hepatosplenomehaly, BS+; abdominal aorta nontender and not dilated by palpation. Pulses 2+ Extremities: no clubbing cyanosis or edema, Homan's sign negative  Neurologic: grossly nonfocal Psychologic: normal affect and mood.  ECG (independently read by me): Sinus rhythm with first-degree AV block with a pulse of 68 bpm.  PR interval 212 ms.  May 2016 ECG (independently read by me): Normal sinus rhythm at 72  bpm.  Incomplete left bundle-branch block.  T-wave abnormality in aVL.  Normal intervals.  November 2015 ECG (independently read by me): Sinus rhythm with first-degree AV block with a heart rate at 80 bpm.  PR interval 228 ms.  T-wave changes in leads 1 and aVL.  Poor anterior R-wave progression.  Prior November 2014 ECG: Sinus rhythm with first-degree AV block. Poor progression anteroseptally. ST-T changes laterally, unchanged  LABS: I have personally reviewed the blood work done by Dr. Burnard Bunting at [facility] on 04/15/2015: BUN 7, creatinine 0.73.  Normal LFTs.  Potassium 4.6.  Glucose 104.  Hemoglobin 13.7, hematocrit 40.7.  White blood count 6.2.  Urinalysis negative.  Hemoglobin A1c 6.3.  Stage III 0.6.  PSA 0.4.  BMP Latest Ref Rng 02/18/2015 02/17/2015 02/16/2015  Glucose 65 - 99 mg/dL 99 97 126(H)  BUN 6 - 20 mg/dL 5(L) 6 9  Creatinine 0.61 - 1.24 mg/dL 0.63 0.66 0.63  Sodium 135 - 145 mmol/L 129(L) 129(L) 124(L)  Potassium 3.5 - 5.1 mmol/L 3.7 3.5 3.5  Chloride 101 - 111 mmol/L 101 96(L) 90(L)  CO2 22 - 32 mmol/L _0 Calcium 8.9 - 10.3 mg/dL 8.2(L) 8.5(L) 8.8(L)   Hepatic Function Latest Ref Rng 02/16/2015  Total Protein 6.5 - 8.1 g/dL 7.2  Albumin 3.5 - 5.0 g/dL 4.0  AST 15 - 41 U/L 15  ALT 17 - 63 U/L 12(L)  Alk Phosphatase 38 - 126 U/L 44  Total Bilirubin 0.3 - 1.2 mg/dL 0.8   CBC Latest Ref Rng 02/17/2015 02/16/2015 01/25/2013  WBC 4.0 - 10.5 K/uL 4.6 7.8 7.4  Hemoglobin 13.0 - 17.0 g/dL 12.4(L) 13.4 13.2  Hematocrit 39.0 - 52.0 % 34.0(L) 37.7(L) 37.0(L)  Platelets 150 - 400 K/uL 236 282 243   Lab Results  Component Value Date   MCV 87.6 02/17/2015   MCV 88.5 02/16/2015   MCV 89.2 01/25/2013    Lab Results  Component Value Date   TSH 3.274 02/17/2015    Lab Results  Component Value Date   HGBA1C 6.2* 02/17/2015    RADIOLOGY: No results found.    ASSESSMENT AND PLAN: Nathan Howell is a 81 year old white male who is   status post CABG surgery x3 in November 2005. His last stress test over 4 years ago showed mild inferior thinning felt to be due to diaphragmatic attenuation without ischemia. Presently, he denies  recurrent anginal symptoms.  He denies any change in exercise tolerance or development of exertional shortness of breath.  His blood pressure today is stable on his medical regimen consisting of amlodipine, losartan, and metoprolol.  He is tolerating simvastatin without myalgias.  Target LDL is less than 70 and I will try to obtain his most recent lipid studies from Dr. Jacquiline Doe office.  His hemoglobin A1c is 6.3 when checked by Dr. Reynaldo Minium.  He denies recent asthma exacerbation but takes Qvar daily.  In November, they will be 5 years since his last nuclear study and I am recommending a follow-up exercise Myoview study be done at that time to reassess graft patency, scar/ischemia.  I will see him in the office in follow-up and further recommendations will be made at that time.  Nathan Sine, MD, [facility]  06/22/2015 6:04 PM

## 2015-09-08 DIAGNOSIS — E784 Other hyperlipidemia: Secondary | ICD-10-CM | POA: Diagnosis not present

## 2015-09-08 DIAGNOSIS — E119 Type 2 diabetes mellitus without complications: Secondary | ICD-10-CM | POA: Diagnosis not present

## 2015-09-08 DIAGNOSIS — E663 Overweight: Secondary | ICD-10-CM | POA: Diagnosis not present

## 2015-09-08 DIAGNOSIS — Z6828 Body mass index (BMI) 28.0-28.9, adult: Secondary | ICD-10-CM | POA: Diagnosis not present

## 2015-09-08 DIAGNOSIS — I10 Essential (primary) hypertension: Secondary | ICD-10-CM | POA: Diagnosis not present

## 2015-09-08 DIAGNOSIS — E871 Hypo-osmolality and hyponatremia: Secondary | ICD-10-CM | POA: Diagnosis not present

## 2015-09-08 DIAGNOSIS — J449 Chronic obstructive pulmonary disease, unspecified: Secondary | ICD-10-CM | POA: Diagnosis not present

## 2015-10-19 DIAGNOSIS — L570 Actinic keratosis: Secondary | ICD-10-CM | POA: Diagnosis not present

## 2015-10-19 DIAGNOSIS — D225 Melanocytic nevi of trunk: Secondary | ICD-10-CM | POA: Diagnosis not present

## 2015-10-19 DIAGNOSIS — X32XXXD Exposure to sunlight, subsequent encounter: Secondary | ICD-10-CM | POA: Diagnosis not present

## 2015-11-09 DIAGNOSIS — J449 Chronic obstructive pulmonary disease, unspecified: Secondary | ICD-10-CM | POA: Diagnosis not present

## 2015-11-09 DIAGNOSIS — E784 Other hyperlipidemia: Secondary | ICD-10-CM | POA: Diagnosis not present

## 2015-11-09 DIAGNOSIS — I10 Essential (primary) hypertension: Secondary | ICD-10-CM | POA: Diagnosis not present

## 2015-11-09 DIAGNOSIS — Z794 Long term (current) use of insulin: Secondary | ICD-10-CM | POA: Diagnosis not present

## 2015-11-09 DIAGNOSIS — I259 Chronic ischemic heart disease, unspecified: Secondary | ICD-10-CM | POA: Diagnosis not present

## 2015-11-09 DIAGNOSIS — Z0000 Encounter for general adult medical examination without abnormal findings: Secondary | ICD-10-CM | POA: Diagnosis not present

## 2015-11-09 DIAGNOSIS — E119 Type 2 diabetes mellitus without complications: Secondary | ICD-10-CM | POA: Diagnosis not present

## 2015-12-01 DIAGNOSIS — H2513 Age-related nuclear cataract, bilateral: Secondary | ICD-10-CM | POA: Diagnosis not present

## 2015-12-01 DIAGNOSIS — E119 Type 2 diabetes mellitus without complications: Secondary | ICD-10-CM | POA: Diagnosis not present

## 2016-01-12 ENCOUNTER — Ambulatory Visit: Payer: Medicare HMO | Admitting: Cardiovascular Disease

## 2016-01-12 DIAGNOSIS — R69 Illness, unspecified: Secondary | ICD-10-CM | POA: Diagnosis not present

## 2016-01-17 DIAGNOSIS — E119 Type 2 diabetes mellitus without complications: Secondary | ICD-10-CM | POA: Diagnosis not present

## 2016-01-17 DIAGNOSIS — E871 Hypo-osmolality and hyponatremia: Secondary | ICD-10-CM | POA: Diagnosis not present

## 2016-01-17 DIAGNOSIS — E663 Overweight: Secondary | ICD-10-CM | POA: Diagnosis not present

## 2016-01-17 DIAGNOSIS — E784 Other hyperlipidemia: Secondary | ICD-10-CM | POA: Diagnosis not present

## 2016-01-17 DIAGNOSIS — J449 Chronic obstructive pulmonary disease, unspecified: Secondary | ICD-10-CM | POA: Diagnosis not present

## 2016-01-17 DIAGNOSIS — Z6826 Body mass index (BMI) 26.0-26.9, adult: Secondary | ICD-10-CM | POA: Diagnosis not present

## 2016-01-17 DIAGNOSIS — I10 Essential (primary) hypertension: Secondary | ICD-10-CM | POA: Diagnosis not present

## 2016-01-28 ENCOUNTER — Encounter: Payer: Self-pay | Admitting: Cardiovascular Disease

## 2016-01-28 ENCOUNTER — Ambulatory Visit (INDEPENDENT_AMBULATORY_CARE_PROVIDER_SITE_OTHER): Payer: Medicare HMO | Admitting: Cardiovascular Disease

## 2016-01-28 VITALS — BP 142/65 | HR 64 | Ht 70.0 in | Wt 187.0 lb

## 2016-01-28 DIAGNOSIS — I2510 Atherosclerotic heart disease of native coronary artery without angina pectoris: Secondary | ICD-10-CM

## 2016-01-28 DIAGNOSIS — I440 Atrioventricular block, first degree: Secondary | ICD-10-CM

## 2016-01-28 DIAGNOSIS — E785 Hyperlipidemia, unspecified: Secondary | ICD-10-CM | POA: Diagnosis not present

## 2016-01-28 DIAGNOSIS — I10 Essential (primary) hypertension: Secondary | ICD-10-CM

## 2016-01-28 DIAGNOSIS — R197 Diarrhea, unspecified: Secondary | ICD-10-CM | POA: Diagnosis not present

## 2016-01-28 NOTE — Progress Notes (Signed)
Patient ID: Nathan Howell, male   DOB: January 07, 1935, 81 y.o.   MRN: [id number]    Primary M.D.: Dr. Burnard Howell  HPI: Nathan Howell is a 81 y.o. male who presents for a 7 month cardiology evaluation.  Nathan Howell has known CAD and on11/23/2005 underwent CABG surgery x3 by Dr. Servando Howell (LIMA to the LAD, vein to the intermediate, and into the distal circumflex). [name] was nondominant and not bypassed. Additional problems include type 2 diabetes mellitus, hypertension, as well as hyperlipidemia. His last nuclear perfusion study in 2012 which showed significant diaphragmatic attenuation but was without ischemia. Ejection fraction 65%. An echo Doppler study in November 2012 showed moderate concentric LVH with normal systolic function grade 1 diastolic dysfunction. He did have moderate LA dilatation, mild mitral annular calcification trace MR, trace TR, and aortic valve sclerosis.  Nathan Howell has continued to be stable from a cardiac standpoint. He denies recent episodes of chest pain. He remains active.  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  He also does all his own yard work.  He denies  palpitations. He denies presyncope or syncope.    He had undergone colonoscopy by Dr. Henrene Howell and was found to have having diverticulitis.  He states his significant diarrhea markedly improved with a significant change in his diet.  Since I last saw him, he denies any episodes of chest pain or shortness of breath.  However, he has continued to have difficulty with diarrhea.  He has recently seen Dr. Reynaldo Minium.  He has lost weight and his insulin dose was reduced.  He presents for evaluation.    Past Medical History:  Diagnosis Date  . Abdominal hernia   . Asthma    [place] - HFA 50% 03/16/09 > 50% 05/13/10  . Collagenous colitis   . COPD (chronic obstructive pulmonary disease) ([place]) dx'd ~ 2015  . Coronary artery disease   . Diverticulosis   . GERD (gastroesophageal reflux disease)   .  Glaucoma   . History of stress test 02/2011   Which showed mild inferior thinning felt to be diaphragmatic attenuation artifact without ichemia. Poststress EF was 65%.  [name] Hx of echocardiogram    EF>55% this showed moderate concentric LVH with normal systolic function. There was evidence for grade 1 diastolic relaxation abnormality with possible very small region of apical lateral hypokinesis.He did have moderate left atrial dilatation by volume assessment. There was mild mitral annular calcification with trace MR, trace TR, mild aortic valve sclerosis without stenosis    . Hyperlipidemia   . Hypertension   . Rhinitis medicamentosa    per remote [place] w/u  . Type II diabetes mellitus ([place])     Past Surgical History:  Procedure Laterality Date  . CARDIAC CATHETERIZATION  02/2004   Normal left ventricular function. Significant multivessel coronary artery disease corresponding to left main equivalent disease with diffuse tubular narrowing of 50% in the proximal left anterior descending, 905 in the ramus intermadius vessel and 80% in the most proximal circumflex veseel with 70% mid A-V groove circumflex stenosis.  [name] CATARACT EXTRACTION W/ INTRAOCULAR LENS IMPLANT Right 2003   . CORNEAL TRANSPLANT Right ~ 2004  . CORONARY ANGIOPLASTY    . CORONARY ARTERY BYPASS GRAFT     x3 By Dr Nathan Howell with LIMA to the LAD, a vein to the intermediate, a vein to the distal circumflex. [name] was nondominant and not bypassed.  [name] GLAUCOMA VALVE INSERTION Right   . HAND  SURGERY Right    "don't remember what was done"  . [place]   "not sure which side"  . KNEE ARTHROSCOPY Bilateral     No Known Allergies  Current Outpatient Prescriptions  Medication Sig Dispense Refill  . ALPHAGAN P 0.1 % SOLN Place 1 drop into the right eye 2 (two) times daily.    [name] amLODipine (NORVASC) 5 MG tablet Take 5 mg by mouth daily.    [name] aspirin 81 MG tablet Take 1 tablet by mouth daily.    .  beclomethasone (QVAR) 80 MCG/ACT inhaler Inhale 2 puffs into the lungs 2 (two) times daily.    . chlorpheniramine (CHLOR-TRIMETON) 4 MG tablet Take 8 mg by mouth at bedtime.     . dorzolamide (TRUSOPT) 2 % ophthalmic solution Place 1 drop into the right eye 2 (two) times daily.      [name] glipiZIDE (GLUCOTROL) 5 MG tablet Take 5 mg by mouth 2 (two) times daily before a meal.     . Insulin Detemir (LEVEMIR FLEXTOUCH) 100 UNIT/ML Pen Inject 20 Units into the skin daily. Or as directed 15 mL 11  . losartan (COZAAR) 100 MG tablet Take 100 mg by mouth daily.    . metFORMIN (GLUCOPHAGE) 1000 MG tablet Take 1,000 mg by mouth 2 (two) times daily with a meal.     . metoprolol tartrate (LOPRESSOR) 25 MG tablet Take 1 tablet (25 mg total) by mouth 2 (two) times daily. 180 tablet 3  . simvastatin (ZOCOR) 20 MG tablet Take 1 tablet by mouth daily.    [name] triamterene-hydrochlorothiazide (MAXZIDE-25) 37.5-25 MG tablet Take 1 tablet by mouth as needed.     No current facility-administered medications for this visit.     Social History   Social History  . Marital status: Married    Spouse name: N/A  . Number of children: 3  . Years of education: N/A   Occupational History  . Retired    Social History Main Topics  . Smoking status: Never Smoker  . Smokeless tobacco: Current User    Types: Snuff  . Alcohol use No  . Drug use: No  . Sexual activity: Not Currently   Other Topics Concern  . Not on file   Social History Narrative   Never smoked cigs regularly but stopped all exposure 83s   Lives with wife    Family History  Problem Relation Age of Onset  . Asthma Mother   . Emphysema Father   . Colon cancer Neg Hx    Social history is normal and that he is married has 3 children 4 grandchildren. He does try to exercise and remains active. There is no tobacco or alcohol use.  ROS General: Negative; No fevers, chills, or night sweats;  HEENT: Negative; No changes in vision or hearing, sinus  congestion, difficulty swallowing Pulmonary: Negative; No cough, wheezing, shortness of breath, hemoptysis Cardiovascular: Negative; No chest pain, presyncope, syncope, palpitations GI: As if her recent diarrhea and diverticular disease. GU: Negative; No dysuria, hematuria, or difficulty voiding Musculoskeletal: Negative; no myalgias, joint pain, or weakness Hematologic/Oncology: Negative; no easy bruising, bleeding Endocrine: Negative; no heat/cold intolerance; no diabetes Neuro: Negative; no changes in balance, headaches Skin: Negative; No rashes or skin lesions Psychiatric: Negative; No behavioral problems, depression Sleep: Negative; No snoring, daytime sleepiness, hypersomnolence, bruxism, restless legs, hypnogognic hallucinations, no cataplexy Other comprehensive 14 point system review is negative.  PE BP (!) 142/65 (BP Location: Right Arm, Patient Position: Sitting,  Cuff Size: Normal)   Pulse 64   Ht _0  (1.778 m)   Wt 187 lb (84.8 kg)   SpO2 97%   BMI 26.83 kg/m    Repeat blood pressure by me 138/68.  Wt Readings from Last 3 Encounters:  01/28/16 187 lb (84.8 kg)  06/21/15 193 lb 6.4 oz (87.7 kg)  02/16/15 186 lb 9.6 oz (84.6 kg)   General: Alert, oriented, no distress.  Skin: normal turgor, no rashes HEENT: Normocephalic, atraumatic. Pupils round and reactive; sclera anicteric;no lid lag.  Nose without nasal septal hypertrophy Mouth/Parynx benign; Mallinpatti scale 2 Neck: No JVD, no carotid bruits with normal carotid upstroke Lungs: clear to ausculatation and percussion; no wheezing or rales Chest wall: Nontender to palpation Heart: RRR, s1 s2 normal 1/6 systolic murmur; .  No diastolic murmur.  No rubs thrills or heaves Abdomen: soft, nontender; no hepatosplenomehaly, BS+; abdominal aorta nontender and not dilated by palpation. Pulses 2+ Extremities: no clubbing cyanosis or edema, Homan's sign negative  Neurologic: grossly nonfocal Psychologic: normal affect  and mood.  ECG (independently read by me): Normal sinus rhythm with first-degree AV block.  Nonspecific ST changes.  Normal intervals.  March 2017 ECG (independently read by me): Sinus rhythm with first-degree AV block with a pulse of 68 bpm.  PR interval 212 ms.  May 2016 ECG (independently read by me): Normal sinus rhythm at 72 bpm.  Incomplete left bundle-branch block.  T-wave abnormality in aVL.  Normal intervals.  November 2015 ECG (independently read by me): Sinus rhythm with first-degree AV block with a heart rate at 80 bpm.  PR interval 228 ms.  T-wave changes in leads 1 and aVL.  Poor anterior R-wave progression.  Prior November 2014 ECG: Sinus rhythm with first-degree AV block. Poor progression anteroseptally. ST-T changes laterally, unchanged  LABS: I have personally reviewed the blood work done by Dr. Burnard Howell at [facility] on 04/15/2015: BUN 7, creatinine 0.73.  Normal LFTs.  Potassium 4.6.  Glucose 104.  Hemoglobin 13.7, hematocrit 40.7.  White blood count 6.2.  Urinalysis negative.  Hemoglobin A1c 6.3.  Stage III 0.6.  PSA 0.4.  BMP Latest Ref Rng & Units 02/18/2015 02/17/2015 02/16/2015  Glucose 65 - 99 mg/dL 99 97 126(H)  BUN 6 - 20 mg/dL 5(L) 6 9  Creatinine 0.61 - 1.24 mg/dL 0.63 0.66 0.63  Sodium 135 - 145 mmol/L 129(L) 129(L) 124(L)  Potassium 3.5 - 5.1 mmol/L 3.7 3.5 3.5  Chloride 101 - 111 mmol/L 101 96(L) 90(L)  CO2 22 - 32 mmol/L _1 Calcium 8.9 - 10.3 mg/dL 8.2(L) 8.5(L) 8.8(L)   Hepatic Function Latest Ref Rng & Units 02/16/2015  Total Protein 6.5 - 8.1 g/dL 7.2  Albumin 3.5 - 5.0 g/dL 4.0  AST 15 - 41 U/L 15  ALT 17 - 63 U/L 12(L)  Alk Phosphatase 38 - 126 U/L 44  Total Bilirubin 0.3 - 1.2 mg/dL 0.8   CBC Latest Ref Rng & Units 02/17/2015 02/16/2015 01/25/2013  WBC 4.0 - 10.5 K/uL 4.6 7.8 7.4  Hemoglobin 13.0 - 17.0 g/dL 12.4(L) 13.4 13.2  Hematocrit 39.0 - 52.0 % 34.0(L) 37.7(L) 37.0(L)  Platelets 150 - 400 K/uL 236 282  243   Lab Results  Component Value Date   MCV 87.6 02/17/2015   MCV 88.5 02/16/2015   MCV 89.2 01/25/2013    Lab Results  Component Value Date   TSH 3.274 02/17/2015    Lab Results  Component Value Date  HGBA1C 6.2 (H) 02/17/2015    RADIOLOGY: No results found.    ASSESSMENT AND PLAN: [name] is a 81 year old white male who is  status post CABG surgery x3 in November 2005. His last stress test was in 2012 mild inferior thinning felt to be due to diaphragmatic attenuation without ischemia. Presently, he denies recurrent anginal symptoms.  He denies any change in exercise tolerance or development of exertional shortness of breath.  He has lost weight since his last evaluation and admits to having difficulty with recurrent diarrhea.  He had undergone prior GI evaluation in the past with Dr. Henrene Howell.  With his weight loss his blood pressure today is stable on his medical regimen consisting of amlodipine, losartan, and metoprolol.  He was also taking Maxide and particularly with his diarrhea.  I have recommended that this be discontinued and only take on a when necessary basis if he develops swelling.  He is tolerating simvastatin without myalgias.  Target LDL is less than 70.  When I last saw him, I suggested that he undergo a five-year follow-up nuclear perfusion study.  Apparently this has not yet been done.  Since he is doing well, I will schedule this to be done in 6 months.  Prior to his next office visit, which will be 12-1/2 years since his CABG revascularization surgery which was done in November 2005.  I will see him in the office in follow-up and further recommendations will be made at that time.  Time spent: 25 minutes Troy Sine, MD, [facility]  01/28/2016 7:13 PM

## 2016-01-28 NOTE — Patient Instructions (Addendum)
Your physician has requested that you have en exercise stress myoview. For further information please visit [email or web address]. Please follow instruction sheet, as given.   The triamterene-HCTZ ( fluid pill)  has been changed to take [place].  Your physician wants you to follow-up in: 6 MONTHS OR SOONER IF NEEDED. You will receive a reminder letter in the mail two months in advance. If you don't receive a letter, please call our office to schedule the follow-up appointment.  If you need a refill on your cardiac medications before your next appointment, please call your pharmacy.

## 2016-04-11 DIAGNOSIS — X32XXXD Exposure to sunlight, subsequent encounter: Secondary | ICD-10-CM | POA: Diagnosis not present

## 2016-04-11 DIAGNOSIS — L308 Other specified dermatitis: Secondary | ICD-10-CM | POA: Diagnosis not present

## 2016-04-11 DIAGNOSIS — L570 Actinic keratosis: Secondary | ICD-10-CM | POA: Diagnosis not present

## 2016-05-25 DIAGNOSIS — Z0000 Encounter for general adult medical examination without abnormal findings: Secondary | ICD-10-CM | POA: Diagnosis not present

## 2016-05-25 DIAGNOSIS — I10 Essential (primary) hypertension: Secondary | ICD-10-CM | POA: Diagnosis not present

## 2016-05-25 DIAGNOSIS — Z125 Encounter for screening for malignant neoplasm of prostate: Secondary | ICD-10-CM | POA: Diagnosis not present

## 2016-05-25 DIAGNOSIS — E119 Type 2 diabetes mellitus without complications: Secondary | ICD-10-CM | POA: Diagnosis not present

## 2016-05-25 DIAGNOSIS — E784 Other hyperlipidemia: Secondary | ICD-10-CM | POA: Diagnosis not present

## 2016-05-26 DIAGNOSIS — H524 Presbyopia: Secondary | ICD-10-CM | POA: Diagnosis not present

## 2016-05-26 DIAGNOSIS — Z961 Presence of intraocular lens: Secondary | ICD-10-CM | POA: Diagnosis not present

## 2016-05-26 DIAGNOSIS — H4031X2 Glaucoma secondary to eye trauma, right eye, moderate stage: Secondary | ICD-10-CM | POA: Diagnosis not present

## 2016-05-30 DIAGNOSIS — H4311 Vitreous hemorrhage, right eye: Secondary | ICD-10-CM | POA: Diagnosis not present

## 2016-06-01 DIAGNOSIS — Z6827 Body mass index (BMI) 27.0-27.9, adult: Secondary | ICD-10-CM | POA: Diagnosis not present

## 2016-06-01 DIAGNOSIS — Z1389 Encounter for screening for other disorder: Secondary | ICD-10-CM | POA: Diagnosis not present

## 2016-06-01 DIAGNOSIS — E871 Hypo-osmolality and hyponatremia: Secondary | ICD-10-CM | POA: Diagnosis not present

## 2016-06-01 DIAGNOSIS — I10 Essential (primary) hypertension: Secondary | ICD-10-CM | POA: Diagnosis not present

## 2016-06-01 DIAGNOSIS — E119 Type 2 diabetes mellitus without complications: Secondary | ICD-10-CM | POA: Diagnosis not present

## 2016-06-01 DIAGNOSIS — J449 Chronic obstructive pulmonary disease, unspecified: Secondary | ICD-10-CM | POA: Diagnosis not present

## 2016-06-01 DIAGNOSIS — E784 Other hyperlipidemia: Secondary | ICD-10-CM | POA: Diagnosis not present

## 2016-06-01 DIAGNOSIS — Z0000 Encounter for general adult medical examination without abnormal findings: Secondary | ICD-10-CM | POA: Diagnosis not present

## 2016-06-01 DIAGNOSIS — E663 Overweight: Secondary | ICD-10-CM | POA: Diagnosis not present

## 2016-06-07 DIAGNOSIS — H4031X2 Glaucoma secondary to eye trauma, right eye, moderate stage: Secondary | ICD-10-CM | POA: Diagnosis not present

## 2016-06-07 DIAGNOSIS — H4031X0 Glaucoma secondary to eye trauma, right eye, stage unspecified: Secondary | ICD-10-CM | POA: Diagnosis not present

## 2016-06-07 DIAGNOSIS — T85398A Other mechanical complication of other ocular prosthetic devices, implants and grafts, initial encounter: Secondary | ICD-10-CM | POA: Diagnosis not present

## 2016-06-07 DIAGNOSIS — H401110 Primary open-angle glaucoma, right eye, stage unspecified: Secondary | ICD-10-CM | POA: Diagnosis not present

## 2016-06-08 ENCOUNTER — Ambulatory Visit: Payer: Medicare HMO | Admitting: Cardiovascular Disease

## 2016-06-14 DIAGNOSIS — J449 Chronic obstructive pulmonary disease, unspecified: Secondary | ICD-10-CM | POA: Diagnosis not present

## 2016-06-14 DIAGNOSIS — Z7982 Long term (current) use of aspirin: Secondary | ICD-10-CM | POA: Diagnosis not present

## 2016-06-14 DIAGNOSIS — Z9181 History of falling: Secondary | ICD-10-CM | POA: Diagnosis not present

## 2016-06-14 DIAGNOSIS — Z959 Presence of cardiac and vascular implant and graft, unspecified: Secondary | ICD-10-CM | POA: Diagnosis not present

## 2016-06-14 DIAGNOSIS — E1139 Type 2 diabetes mellitus with other diabetic ophthalmic complication: Secondary | ICD-10-CM | POA: Diagnosis not present

## 2016-06-14 DIAGNOSIS — L539 Erythematous condition, unspecified: Secondary | ICD-10-CM | POA: Diagnosis not present

## 2016-06-14 DIAGNOSIS — Z0000 Encounter for general adult medical examination without abnormal findings: Secondary | ICD-10-CM | POA: Diagnosis not present

## 2016-06-14 DIAGNOSIS — I10 Essential (primary) hypertension: Secondary | ICD-10-CM | POA: Diagnosis not present

## 2016-06-14 DIAGNOSIS — Z6827 Body mass index (BMI) 27.0-27.9, adult: Secondary | ICD-10-CM | POA: Diagnosis not present

## 2016-06-14 DIAGNOSIS — K08109 Complete loss of teeth, unspecified cause, unspecified class: Secondary | ICD-10-CM | POA: Diagnosis not present

## 2016-06-14 DIAGNOSIS — H409 Unspecified glaucoma: Secondary | ICD-10-CM | POA: Diagnosis not present

## 2016-06-14 DIAGNOSIS — J309 Allergic rhinitis, unspecified: Secondary | ICD-10-CM | POA: Diagnosis not present

## 2016-06-14 DIAGNOSIS — E785 Hyperlipidemia, unspecified: Secondary | ICD-10-CM | POA: Diagnosis not present

## 2016-06-14 DIAGNOSIS — Z79899 Other long term (current) drug therapy: Secondary | ICD-10-CM | POA: Diagnosis not present

## 2016-06-14 DIAGNOSIS — Z972 Presence of dental prosthetic device (complete) (partial): Secondary | ICD-10-CM | POA: Diagnosis not present

## 2016-06-15 DIAGNOSIS — Z1212 Encounter for screening for malignant neoplasm of rectum: Secondary | ICD-10-CM | POA: Diagnosis not present

## 2016-06-16 ENCOUNTER — Other Ambulatory Visit: Payer: Self-pay | Admitting: Cardiovascular Disease

## 2016-06-16 NOTE — Telephone Encounter (Signed)
Rx(s) sent to pharmacy electronically.  

## 2016-06-19 ENCOUNTER — Encounter: Payer: Self-pay | Admitting: Cardiovascular Disease

## 2016-06-19 ENCOUNTER — Ambulatory Visit (INDEPENDENT_AMBULATORY_CARE_PROVIDER_SITE_OTHER): Payer: Medicare HMO | Admitting: Cardiovascular Disease

## 2016-06-19 VITALS — Ht 70.0 in | Wt 191.0 lb

## 2016-06-19 DIAGNOSIS — Z79899 Other long term (current) drug therapy: Secondary | ICD-10-CM | POA: Diagnosis not present

## 2016-06-19 DIAGNOSIS — E118 Type 2 diabetes mellitus with unspecified complications: Secondary | ICD-10-CM

## 2016-06-19 DIAGNOSIS — H409 Unspecified glaucoma: Secondary | ICD-10-CM | POA: Diagnosis not present

## 2016-06-19 DIAGNOSIS — Z794 Long term (current) use of insulin: Secondary | ICD-10-CM

## 2016-06-19 DIAGNOSIS — I2510 Atherosclerotic heart disease of native coronary artery without angina pectoris: Secondary | ICD-10-CM

## 2016-06-19 DIAGNOSIS — I10 Essential (primary) hypertension: Secondary | ICD-10-CM

## 2016-06-19 DIAGNOSIS — E785 Hyperlipidemia, unspecified: Secondary | ICD-10-CM | POA: Diagnosis not present

## 2016-06-19 MED ORDER — ROSUVASTATIN CALCIUM 20 MG PO TABS: 20.0000 mg | ORAL_TABLET | Freq: Every day | ORAL | 3 refills | Status: DC

## 2016-06-19 NOTE — Progress Notes (Signed)
Patient ID: Nathan Howell, male   DOB: 07-06-1934, 82 y.o.   MRN: [id number]    Primary M.D.: Dr. Burnard Bunting  HPI: Nathan Howell is a 82 y.o. male who presents for a 5 month cardiology evaluation.  Nathan Howell has known CAD and on11/23/2005 underwent CABG surgery x3 by Dr. Servando Snare (LIMA to the LAD, vein to the intermediate, and into the distal circumflex). [name] was nondominant and not bypassed. Additional problems include type 2 diabetes mellitus, hypertension, as well as hyperlipidemia. His last nuclear perfusion study in 2012 which showed significant diaphragmatic attenuation but was without ischemia. Ejection fraction 65%. An echo Doppler study in November 2012 showed moderate concentric LVH with normal systolic function grade 1 diastolic dysfunction. He did have moderate LA dilatation, mild mitral annular calcification trace MR, trace TR, and aortic valve sclerosis.  Nathan Howell has continued to be stable from a cardiac standpoint. He denies recent episodes of chest pain. He remains active.  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  He also does all his own yard work.  He denies  palpitations. He denies presyncope or syncope.    He had undergone colonoscopy by Dr. Henrene Pastor and was found to have having diverticulitis.  He states his significant diarrhea markedly improved with a significant change in his diet.  Since I last saw him, he underwent eye surgery by Dr. Valetta Close for a glaucoma and some bleeding issues.  His aspirin was stopped and he has not seen the ophthalmologist back and has not yet been cleared to reinstitute ASA.  He has seen Dr. Reynaldo Minium.  I reviewed his complete set of blood work from 06/21/2016.  His labs were stable.  BUN was 18, creatinine 0.8.  Hemoglobin and hematocrit were 13.5 and 40.5, respectively.  TSH and PSA were normal.  Total cholesterol was 131, triglycerides 62, HDL 37, LDL was 82.  He denies any episodes of chest pain or shortness of breath.   He presents for evaluation.   Past Medical History:  Diagnosis Date  . Abdominal hernia   . Asthma    [place] - HFA 50% 03/16/09 > 50% 05/13/10  . Collagenous colitis   . COPD (chronic obstructive pulmonary disease) ([place]) dx'd ~ 2015  . Coronary artery disease   . Diverticulosis   . GERD (gastroesophageal reflux disease)   . Glaucoma   . History of stress test 02/2011   Which showed mild inferior thinning felt to be diaphragmatic attenuation artifact without ichemia. Poststress EF was 65%.  Nathan Howell Hx of echocardiogram    EF>55% this showed moderate concentric LVH with normal systolic function. There was evidence for grade 1 diastolic relaxation abnormality with possible very small region of apical lateral hypokinesis.He did have moderate left atrial dilatation by volume assessment. There was mild mitral annular calcification with trace MR, trace TR, mild aortic valve sclerosis without stenosis    . Hyperlipidemia   . Hypertension   . Rhinitis medicamentosa    per remote [place] w/u  . Type II diabetes mellitus ([place])     Past Surgical History:  Procedure Laterality Date  . CARDIAC CATHETERIZATION  02/2004   Normal left ventricular function. Significant multivessel coronary artery disease corresponding to left main equivalent disease with diffuse tubular narrowing of 50% in the proximal left anterior descending, 905 in the ramus intermadius vessel and 80% in the most proximal circumflex veseel with 70% mid A-V groove circumflex stenosis.  Nathan Howell CATARACT EXTRACTION W/ INTRAOCULAR LENS IMPLANT  Right 2003   . CORNEAL TRANSPLANT Right ~ 2004  . CORONARY ANGIOPLASTY    . CORONARY ARTERY BYPASS GRAFT     x3 By Dr Servando Snare with LIMA to the LAD, a vein to the intermediate, a vein to the distal circumflex. [name] was nondominant and not bypassed.  Nathan Howell GLAUCOMA VALVE INSERTION Right   . HAND SURGERY Right    "don't remember what was done"  . [place]   "not sure which side"  . KNEE  ARTHROSCOPY Bilateral     No Known Allergies  Current Outpatient Prescriptions  Medication Sig Dispense Refill  . ALPHAGAN P 0.1 % SOLN Place 1 drop into the right eye 2 (two) times daily.    Nathan Howell amLODipine (NORVASC) 5 MG tablet Take 5 mg by mouth daily.    Nathan Howell aspirin 81 MG tablet Take 1 tablet by mouth daily.    . beclomethasone (QVAR) 80 MCG/ACT inhaler Inhale 2 puffs into the lungs 2 (two) times daily.    . chlorpheniramine (CHLOR-TRIMETON) 4 MG tablet Take 8 mg by mouth at bedtime.     . dorzolamide (TRUSOPT) 2 % ophthalmic solution Place 1 drop into the right eye 2 (two) times daily.      Nathan Howell gatifloxacin (ZYMAXID) 0.5 % SOLN Place 4 drops into the right eye daily.    Nathan Howell glipiZIDE (GLUCOTROL) 5 MG tablet Take 5 mg by mouth 2 (two) times daily before a meal.     . Insulin Detemir (LEVEMIR FLEXTOUCH) 100 UNIT/ML Pen Inject 20 Units into the skin daily. Or as directed 15 mL 11  . losartan (COZAAR) 100 MG tablet Take 100 mg by mouth daily.    . metFORMIN (GLUCOPHAGE-XR) 500 MG 24 hr tablet Take 500 mg by mouth 2 (two) times daily.    . metoprolol tartrate (LOPRESSOR) 25 MG tablet Take 1 tablet (25 mg total) by mouth 2 (two) times daily. 180 tablet 0  . triamterene-hydrochlorothiazide (MAXZIDE-25) 37.5-25 MG tablet Take 1 tablet by mouth as needed.    . rosuvastatin (CRESTOR) 20 MG tablet Take 1 tablet (20 mg total) by mouth daily. 90 tablet 3   No current facility-administered medications for this visit.     Social History   Social History  . Marital status: Married    Spouse name: N/A  . Number of children: 3  . Years of education: N/A   Occupational History  . Retired    Social History Main Topics  . Smoking status: Never Smoker  . Smokeless tobacco: Current User    Types: Snuff  . Alcohol use No  . Drug use: No  . Sexual activity: Not Currently   Other Topics Concern  . Not on file   Social History Narrative   Never smoked cigs regularly but stopped all exposure 77s    Lives with wife    Family History  Problem Relation Age of Onset  . Asthma Mother   . Emphysema Father   . Colon cancer Neg Hx    Social history is normal and that he is married has 3 children 4 grandchildren. He does try to exercise and remains active. There is no tobacco or alcohol use.  ROS General: Negative; No fevers, chills, or night sweats;  HEENT: Negative; No changes in vision or hearing, sinus congestion, difficulty swallowing Pulmonary: Negative; No cough, wheezing, shortness of breath, hemoptysis Cardiovascular: Negative; No chest pain, presyncope, syncope, palpitations GI: As if her recent diarrhea and diverticular disease. GU:  Negative; No dysuria, hematuria, or difficulty voiding Musculoskeletal: Negative; no myalgias, joint pain, or weakness Hematologic/Oncology: Negative; no easy bruising, bleeding Endocrine: Negative; no heat/cold intolerance; no diabetes Neuro: Negative; no changes in balance, headaches Skin: Negative; No rashes or skin lesions Psychiatric: Negative; No behavioral problems, depression Sleep: Negative; No snoring, daytime sleepiness, hypersomnolence, bruxism, restless legs, hypnogognic hallucinations, no cataplexy Other comprehensive 14 point system review is negative.  PE Ht '5\' 10"'  (1.778 m)   Wt 191 lb (86.6 kg)   BMI 27.41 kg/m    Repeat blood pressure by me 132/66  Wt Readings from Last 3 Encounters:  06/19/16 191 lb (86.6 kg)  01/28/16 187 lb (84.8 kg)  06/21/15 193 lb 6.4 oz (87.7 kg)   General: Alert, oriented, no distress.  Skin: normal turgor, no rashes HEENT: Normocephalic, atraumatic. Pupils round and reactive; sclera anicteric;no lid lag.  Nose without nasal septal hypertrophy Mouth/Parynx benign; Mallinpatti scale 2 Neck: No JVD, no carotid bruits with normal carotid upstroke Lungs: clear to ausculatation and percussion; no wheezing or rales Chest wall: Nontender to palpation Heart: RRR, s1 s2 normal 1/6 systolic murmur;  .  No diastolic murmur.  No rubs thrills or heaves Abdomen: soft, nontender; no hepatosplenomehaly, BS+; abdominal aorta nontender and not dilated by palpation. Pulses 2+ Extremities: no clubbing cyanosis or edema, Homan's sign negative  Neurologic: grossly nonfocal Psychologic: normal affect and mood.  ECG (independently read by me): Normal sinus rhythm at 64 bpm.  First-degree AV block with a PR interval at 24 ms.  Left axis deviation.  October 2017 ECG (independently read by me): Normal sinus rhythm with first-degree AV block.  Nonspecific ST changes.  Normal intervals.  March 2017 ECG (independently read by me): Sinus rhythm with first-degree AV block with a pulse of 68 bpm.  PR interval 212 ms.  May 2016 ECG (independently read by me): Normal sinus rhythm at 72 bpm.  Incomplete left bundle-branch block.  T-wave abnormality in aVL.  Normal intervals.  November 2015 ECG (independently read by me): Sinus rhythm with first-degree AV block with a heart rate at 80 bpm.  PR interval 228 ms.  T-wave changes in leads 1 and aVL.  Poor anterior R-wave progression.  Prior November 2014 ECG: Sinus rhythm with first-degree AV block. Poor progression anteroseptally. ST-T changes laterally, unchanged  LABS: Blood work done by Dr. Burnard Bunting at [facility] on 04/15/2015: BUN 7, creatinine 0.73.  Normal LFTs.  Potassium 4.6.  Glucose 104.  Hemoglobin 13.7, hematocrit 40.7.  White blood count 6.2.  Urinalysis negative.  Hemoglobin A1c 6.3.  Stage III 0.6.  PSA 0.4.  I personally reviewed the blood work from February, 2018 as noted above in the history of present illness.  BMP Latest Ref Rng & Units 02/18/2015 02/17/2015 02/16/2015  Glucose 65 - 99 mg/dL 99 97 126(H)  BUN 6 - 20 mg/dL 5(L) 6 9  Creatinine 0.61 - 1.24 mg/dL 0.63 0.66 0.63  Sodium 135 - 145 mmol/L 129(L) 129(L) 124(L)  Potassium 3.5 - 5.1 mmol/L 3.7 3.5 3.5  Chloride 101 - 111 mmol/L 101 96(L) 90(L)  CO2 22 - 32  mmol/L '22 26 28  ' Calcium 8.9 - 10.3 mg/dL 8.2(L) 8.5(L) 8.8(L)   Hepatic Function Latest Ref Rng & Units 02/16/2015  Total Protein 6.5 - 8.1 g/dL 7.2  Albumin 3.5 - 5.0 g/dL 4.0  AST 15 - 41 U/L 15  ALT 17 - 63 U/L 12(L)  Alk Phosphatase 38 - 126 U/L 44  Total  Bilirubin 0.3 - 1.2 mg/dL 0.8   CBC Latest Ref Rng & Units 02/17/2015 02/16/2015 01/25/2013  WBC 4.0 - 10.5 K/uL 4.6 7.8 7.4  Hemoglobin 13.0 - 17.0 g/dL 12.4(L) 13.4 13.2  Hematocrit 39.0 - 52.0 % 34.0(L) 37.7(L) 37.0(L)  Platelets 150 - 400 K/uL 236 282 243   Lab Results  Component Value Date   MCV 87.6 02/17/2015   MCV 88.5 02/16/2015   MCV 89.2 01/25/2013    Lab Results  Component Value Date   TSH 3.274 02/17/2015    Lab Results  Component Value Date   HGBA1C 6.2 (H) 02/17/2015    RADIOLOGY: No results found.  IMPRESSION:  1. Coronary artery disease involving native coronary artery of native heart without angina pectoris   2. Essential hypertension   3. Hyperlipidemia with target LDL less than 70   4. Type 2 diabetes mellitus with complication, with long-term current use of insulin ([place])   5. Glaucoma, unspecified glaucoma type, unspecified laterality   6. Medication management     ASSESSMENT AND PLAN: Mr. Laderius Valbuena is a 82 year old white male who is  status post CABG surgery x3 in November 2005. His last stress test  in 2012 revealed mild inferior thinning felt to be due to diaphragmatic attenuation without ischemia. Presently, he denies recurrent anginal symptoms.  He denies any change in exercise tolerance or development of exertional shortness of breath.  He has lost weight and issues with diarrhea for which she had seen Dr. Neldon Newport a.  He recently was found to have some blood in his eye and he underwent surgery arts 7 2018 by Dr. Valetta Close.  He has not been on aspirin since that time.  Presently, he is without anginal symptoms.  I reviewed his complete blood work which was done by Dr. Reynaldo Minium.  One month  ago.  I discussed with him recent literature with continued aggressive LDL lowering and since he has established CAD and his LDL cholesterol is 82.  He has been on amlodipine 5 mg, metoprolol 25 mg twice a day  and losartan 100 mg for hypertension.  He has been on simvastatin 20 mg for his lipids.  I have recommended that he change to high potency statin rather than simvastatin 20 mg.  He is on the maximally recommended dose of simvastatin with amlodipine and for this reason, I elected to change his statin to  rosuvastatin 20 mg, which hopefully will get him at target, which is less than 70, preferably lower.  He is diabetic on insulin, metformin, and Glucotrol and the most recent endocrinological guidelines have actually set a target LDL of less than 55 in patients with extreme CAD risk.  He will be seeing his ophthalmologist and if he is given clearance to resume aspirin,  aspirin 81 mg will be reinstituted.  He denies any shortness of breath.  He is no longer on Maxide and there is no significant edema.  Since his last stress test was in 2012, in November 2018, prior to his next office visit, I will schedule him for an exercise Myoview study.  I will see him back in the office following that study for further evaluation.  Time spent: 25 minutes Troy Sine, MD, [facility]  06/21/2016 6:42 AM

## 2016-06-19 NOTE — Patient Instructions (Signed)
Your physician has recommended you make the following change in your medication:   1.) STOP the simvastatin. This has been replaced with generic CRESTOR.  Your physician recommends that you return for lab work in: 3 months.  Your physician has requested that you have en exercise stress myoview. For further information please visit [email or web address]. Please follow instruction sheet, as given.  This will be done in November with office visit to follow.     If you need a refill on your cardiac medications before your next appointment, please call your pharmacy.

## 2016-07-05 DIAGNOSIS — H4311 Vitreous hemorrhage, right eye: Secondary | ICD-10-CM | POA: Diagnosis not present

## 2016-07-31 DIAGNOSIS — L820 Inflamed seborrheic keratosis: Secondary | ICD-10-CM | POA: Diagnosis not present

## 2016-07-31 DIAGNOSIS — B078 Other viral warts: Secondary | ICD-10-CM | POA: Diagnosis not present

## 2016-07-31 DIAGNOSIS — L570 Actinic keratosis: Secondary | ICD-10-CM | POA: Diagnosis not present

## 2016-07-31 DIAGNOSIS — X32XXXD Exposure to sunlight, subsequent encounter: Secondary | ICD-10-CM | POA: Diagnosis not present

## 2016-09-15 ENCOUNTER — Other Ambulatory Visit: Payer: Self-pay | Admitting: Cardiovascular Disease

## 2016-09-15 NOTE — Telephone Encounter (Signed)
REFILL 

## 2016-10-17 DIAGNOSIS — L570 Actinic keratosis: Secondary | ICD-10-CM | POA: Diagnosis not present

## 2016-10-17 DIAGNOSIS — Z08 Encounter for follow-up examination after completed treatment for malignant neoplasm: Secondary | ICD-10-CM | POA: Diagnosis not present

## 2016-10-17 DIAGNOSIS — D225 Melanocytic nevi of trunk: Secondary | ICD-10-CM | POA: Diagnosis not present

## 2016-10-17 DIAGNOSIS — X32XXXD Exposure to sunlight, subsequent encounter: Secondary | ICD-10-CM | POA: Diagnosis not present

## 2016-10-17 DIAGNOSIS — Z85828 Personal history of other malignant neoplasm of skin: Secondary | ICD-10-CM | POA: Diagnosis not present

## 2016-11-07 DIAGNOSIS — H4031X2 Glaucoma secondary to eye trauma, right eye, moderate stage: Secondary | ICD-10-CM | POA: Diagnosis not present

## 2016-11-07 DIAGNOSIS — H1789 Other corneal scars and opacities: Secondary | ICD-10-CM | POA: Diagnosis not present

## 2016-11-07 DIAGNOSIS — Z961 Presence of intraocular lens: Secondary | ICD-10-CM | POA: Diagnosis not present

## 2016-11-21 DIAGNOSIS — H5203 Hypermetropia, bilateral: Secondary | ICD-10-CM | POA: Diagnosis not present

## 2016-11-21 DIAGNOSIS — H4031X3 Glaucoma secondary to eye trauma, right eye, severe stage: Secondary | ICD-10-CM | POA: Diagnosis not present

## 2016-12-11 DIAGNOSIS — E119 Type 2 diabetes mellitus without complications: Secondary | ICD-10-CM | POA: Diagnosis not present

## 2016-12-11 DIAGNOSIS — J449 Chronic obstructive pulmonary disease, unspecified: Secondary | ICD-10-CM | POA: Diagnosis not present

## 2016-12-11 DIAGNOSIS — E784 Other hyperlipidemia: Secondary | ICD-10-CM | POA: Diagnosis not present

## 2016-12-11 DIAGNOSIS — E663 Overweight: Secondary | ICD-10-CM | POA: Diagnosis not present

## 2016-12-11 DIAGNOSIS — Z6827 Body mass index (BMI) 27.0-27.9, adult: Secondary | ICD-10-CM | POA: Diagnosis not present

## 2016-12-11 DIAGNOSIS — I10 Essential (primary) hypertension: Secondary | ICD-10-CM | POA: Diagnosis not present

## 2016-12-11 DIAGNOSIS — E871 Hypo-osmolality and hyponatremia: Secondary | ICD-10-CM | POA: Diagnosis not present

## 2016-12-11 DIAGNOSIS — Z23 Encounter for immunization: Secondary | ICD-10-CM | POA: Diagnosis not present

## 2017-01-29 DIAGNOSIS — C44319 Basal cell carcinoma of skin of other parts of face: Secondary | ICD-10-CM | POA: Diagnosis not present

## 2017-01-29 DIAGNOSIS — X32XXXD Exposure to sunlight, subsequent encounter: Secondary | ICD-10-CM | POA: Diagnosis not present

## 2017-01-29 DIAGNOSIS — Z85828 Personal history of other malignant neoplasm of skin: Secondary | ICD-10-CM | POA: Diagnosis not present

## 2017-01-29 DIAGNOSIS — Z08 Encounter for follow-up examination after completed treatment for malignant neoplasm: Secondary | ICD-10-CM | POA: Diagnosis not present

## 2017-01-29 DIAGNOSIS — L570 Actinic keratosis: Secondary | ICD-10-CM | POA: Diagnosis not present

## 2017-02-16 ENCOUNTER — Telehealth (HOSPITAL_COMMUNITY): Payer: Self-pay

## 2017-02-16 NOTE — Telephone Encounter (Signed)
Encounter complete. 

## 2017-02-20 ENCOUNTER — Ambulatory Visit (HOSPITAL_COMMUNITY)
Admission: RE | Admit: 2017-02-20 | Discharge: 2017-02-20 | Disposition: A | Discharge status: Home / Self Care | Payer: Medicare HMO | Source: Ambulatory Visit | Attending: Cardiology | Admitting: Cardiology

## 2017-02-20 DIAGNOSIS — I2510 Atherosclerotic heart disease of native coronary artery without angina pectoris: Secondary | ICD-10-CM | POA: Insufficient documentation

## 2017-02-20 DIAGNOSIS — I10 Essential (primary) hypertension: Secondary | ICD-10-CM | POA: Insufficient documentation

## 2017-02-20 LAB — MYOCARDIAL PERFUSION IMAGING
CHL CUP NUCLEAR SSS: 2
CSEPEDS: 10 s
Estimated workload: 7 METS
Exercise duration (min): 5 min
LV dias vol: 105 mL (ref 62–150)
LVSYSVOL: 43 mL
MPHR: 141 {beats}/min
Peak HR: 137 {beats}/min
Percent HR: 97 %
RPE: 18
Rest HR: 67 {beats}/min
SDS: 0
SRS: 2
TID: 0.99

## 2017-02-20 MED ORDER — TECHNETIUM TC 99M TETROFOSMIN IV KIT
10.5000 | PACK | Freq: Once | INTRAVENOUS | Status: AC | PRN
  Administered 2017-02-20: 10.5 via INTRAVENOUS
  Filled 2017-02-20: qty 11

## 2017-02-20 MED ORDER — TECHNETIUM TC 99M TETROFOSMIN IV KIT
31.5000 | PACK | Freq: Once | INTRAVENOUS | Status: AC | PRN
  Administered 2017-02-20: 31.5 via INTRAVENOUS
  Filled 2017-02-20: qty 32

## 2017-02-27 DIAGNOSIS — H5711 Ocular pain, right eye: Secondary | ICD-10-CM | POA: Diagnosis not present

## 2017-02-27 DIAGNOSIS — H4031X3 Glaucoma secondary to eye trauma, right eye, severe stage: Secondary | ICD-10-CM | POA: Diagnosis not present

## 2017-03-20 DIAGNOSIS — L308 Other specified dermatitis: Secondary | ICD-10-CM | POA: Diagnosis not present

## 2017-03-20 DIAGNOSIS — C4A30 Merkel cell carcinoma of unspecified part of face: Secondary | ICD-10-CM | POA: Diagnosis not present

## 2017-03-20 DIAGNOSIS — C44309 Unspecified malignant neoplasm of skin of other parts of face: Secondary | ICD-10-CM | POA: Diagnosis not present

## 2017-03-20 DIAGNOSIS — L570 Actinic keratosis: Secondary | ICD-10-CM | POA: Diagnosis not present

## 2017-03-20 DIAGNOSIS — X32XXXD Exposure to sunlight, subsequent encounter: Secondary | ICD-10-CM | POA: Diagnosis not present

## 2017-04-10 DIAGNOSIS — H524 Presbyopia: Secondary | ICD-10-CM | POA: Diagnosis not present

## 2017-04-11 DIAGNOSIS — C4A30 Merkel cell carcinoma of unspecified part of face: Secondary | ICD-10-CM | POA: Diagnosis not present

## 2017-04-17 DIAGNOSIS — Z951 Presence of aortocoronary bypass graft: Secondary | ICD-10-CM | POA: Diagnosis not present

## 2017-04-17 DIAGNOSIS — I2510 Atherosclerotic heart disease of native coronary artery without angina pectoris: Secondary | ICD-10-CM | POA: Diagnosis not present

## 2017-04-17 DIAGNOSIS — I444 Left anterior fascicular block: Secondary | ICD-10-CM | POA: Diagnosis not present

## 2017-04-17 DIAGNOSIS — E119 Type 2 diabetes mellitus without complications: Secondary | ICD-10-CM | POA: Diagnosis not present

## 2017-04-17 DIAGNOSIS — D2239 Melanocytic nevi of other parts of face: Secondary | ICD-10-CM | POA: Diagnosis not present

## 2017-04-17 DIAGNOSIS — C4A9 Merkel cell carcinoma, unspecified: Secondary | ICD-10-CM | POA: Diagnosis not present

## 2017-04-17 DIAGNOSIS — I10 Essential (primary) hypertension: Secondary | ICD-10-CM | POA: Diagnosis not present

## 2017-04-17 DIAGNOSIS — C4A39 Merkel cell carcinoma of other parts of face: Secondary | ICD-10-CM | POA: Diagnosis not present

## 2017-04-17 DIAGNOSIS — C4A30 Merkel cell carcinoma of unspecified part of face: Secondary | ICD-10-CM | POA: Diagnosis not present

## 2017-04-17 DIAGNOSIS — L905 Scar conditions and fibrosis of skin: Secondary | ICD-10-CM | POA: Diagnosis not present

## 2017-04-17 DIAGNOSIS — I440 Atrioventricular block, first degree: Secondary | ICD-10-CM | POA: Diagnosis not present

## 2017-04-17 DIAGNOSIS — J449 Chronic obstructive pulmonary disease, unspecified: Secondary | ICD-10-CM | POA: Diagnosis not present

## 2017-04-17 DIAGNOSIS — E785 Hyperlipidemia, unspecified: Secondary | ICD-10-CM | POA: Diagnosis not present

## 2017-04-20 DIAGNOSIS — C4A4 Merkel cell carcinoma of scalp and neck: Secondary | ICD-10-CM | POA: Diagnosis not present

## 2017-04-24 DIAGNOSIS — C4A30 Merkel cell carcinoma of unspecified part of face: Secondary | ICD-10-CM | POA: Diagnosis not present

## 2017-04-24 DIAGNOSIS — Z09 Encounter for follow-up examination after completed treatment for conditions other than malignant neoplasm: Secondary | ICD-10-CM | POA: Diagnosis not present

## 2017-05-01 DIAGNOSIS — C4A30 Merkel cell carcinoma of unspecified part of face: Secondary | ICD-10-CM | POA: Diagnosis not present

## 2017-05-01 DIAGNOSIS — Z09 Encounter for follow-up examination after completed treatment for conditions other than malignant neoplasm: Secondary | ICD-10-CM | POA: Diagnosis not present

## 2017-05-07 ENCOUNTER — Other Ambulatory Visit: Payer: Self-pay | Admitting: Radiation Oncology

## 2017-05-07 ENCOUNTER — Other Ambulatory Visit (HOSPITAL_COMMUNITY): Payer: Self-pay | Admitting: Radiation Oncology

## 2017-05-07 DIAGNOSIS — C4A39 Merkel cell carcinoma of other parts of face: Secondary | ICD-10-CM | POA: Diagnosis not present

## 2017-05-07 DIAGNOSIS — C4A30 Merkel cell carcinoma of unspecified part of face: Secondary | ICD-10-CM | POA: Diagnosis not present

## 2017-05-07 DIAGNOSIS — Z7729 Contact with and (suspected ) exposure to other hazardous substances: Secondary | ICD-10-CM | POA: Diagnosis not present

## 2017-05-07 DIAGNOSIS — Z510 Encounter for antineoplastic radiation therapy: Secondary | ICD-10-CM | POA: Diagnosis not present

## 2017-05-11 ENCOUNTER — Encounter (HOSPITAL_COMMUNITY)
Admission: RE | Admit: 2017-05-11 | Discharge: 2017-05-11 | Disposition: A | Discharge status: Home / Self Care | Payer: Medicare HMO | Source: Ambulatory Visit | Attending: Radiation Oncology | Admitting: Radiation Oncology

## 2017-05-11 DIAGNOSIS — C4A9 Merkel cell carcinoma, unspecified: Secondary | ICD-10-CM | POA: Diagnosis not present

## 2017-05-11 DIAGNOSIS — C4A30 Merkel cell carcinoma of unspecified part of face: Secondary | ICD-10-CM | POA: Diagnosis not present

## 2017-05-11 LAB — GLUCOSE, CAPILLARY: Glucose-Capillary: 153 mg/dL — ABNORMAL HIGH (ref 65–99)

## 2017-05-11 MED ORDER — FLUDEOXYGLUCOSE F - 18 (FDG) INJECTION: 9.5800 | Freq: Once | INTRAVENOUS | Status: DC | PRN

## 2017-05-12 ENCOUNTER — Encounter (HOSPITAL_COMMUNITY): Payer: Medicare HMO

## 2017-05-15 DIAGNOSIS — Z510 Encounter for antineoplastic radiation therapy: Secondary | ICD-10-CM | POA: Diagnosis not present

## 2017-05-15 DIAGNOSIS — C4A39 Merkel cell carcinoma of other parts of face: Secondary | ICD-10-CM | POA: Diagnosis not present

## 2017-05-15 DIAGNOSIS — C4A30 Merkel cell carcinoma of unspecified part of face: Secondary | ICD-10-CM | POA: Diagnosis not present

## 2017-05-15 DIAGNOSIS — Z7729 Contact with and (suspected ) exposure to other hazardous substances: Secondary | ICD-10-CM | POA: Diagnosis not present

## 2017-05-21 DIAGNOSIS — Z6827 Body mass index (BMI) 27.0-27.9, adult: Secondary | ICD-10-CM | POA: Diagnosis not present

## 2017-05-21 DIAGNOSIS — Z1389 Encounter for screening for other disorder: Secondary | ICD-10-CM | POA: Diagnosis not present

## 2017-05-21 DIAGNOSIS — E7849 Other hyperlipidemia: Secondary | ICD-10-CM | POA: Diagnosis not present

## 2017-05-21 DIAGNOSIS — J449 Chronic obstructive pulmonary disease, unspecified: Secondary | ICD-10-CM | POA: Diagnosis not present

## 2017-05-21 DIAGNOSIS — I10 Essential (primary) hypertension: Secondary | ICD-10-CM | POA: Diagnosis not present

## 2017-05-21 DIAGNOSIS — E871 Hypo-osmolality and hyponatremia: Secondary | ICD-10-CM | POA: Diagnosis not present

## 2017-05-21 DIAGNOSIS — E663 Overweight: Secondary | ICD-10-CM | POA: Diagnosis not present

## 2017-05-21 DIAGNOSIS — E119 Type 2 diabetes mellitus without complications: Secondary | ICD-10-CM | POA: Diagnosis not present

## 2017-05-22 DIAGNOSIS — Z7729 Contact with and (suspected ) exposure to other hazardous substances: Secondary | ICD-10-CM | POA: Diagnosis not present

## 2017-05-22 DIAGNOSIS — Z510 Encounter for antineoplastic radiation therapy: Secondary | ICD-10-CM | POA: Diagnosis not present

## 2017-05-22 DIAGNOSIS — C4A30 Merkel cell carcinoma of unspecified part of face: Secondary | ICD-10-CM | POA: Diagnosis not present

## 2017-05-22 DIAGNOSIS — C4A39 Merkel cell carcinoma of other parts of face: Secondary | ICD-10-CM | POA: Diagnosis not present

## 2017-05-24 DIAGNOSIS — C4A39 Merkel cell carcinoma of other parts of face: Secondary | ICD-10-CM | POA: Diagnosis not present

## 2017-05-24 DIAGNOSIS — Z7729 Contact with and (suspected ) exposure to other hazardous substances: Secondary | ICD-10-CM | POA: Diagnosis not present

## 2017-05-24 DIAGNOSIS — Z510 Encounter for antineoplastic radiation therapy: Secondary | ICD-10-CM | POA: Diagnosis not present

## 2017-05-24 DIAGNOSIS — C4A30 Merkel cell carcinoma of unspecified part of face: Secondary | ICD-10-CM | POA: Diagnosis not present

## 2017-05-25 DIAGNOSIS — Z510 Encounter for antineoplastic radiation therapy: Secondary | ICD-10-CM | POA: Diagnosis not present

## 2017-05-25 DIAGNOSIS — C4A39 Merkel cell carcinoma of other parts of face: Secondary | ICD-10-CM | POA: Diagnosis not present

## 2017-05-25 DIAGNOSIS — Z7729 Contact with and (suspected ) exposure to other hazardous substances: Secondary | ICD-10-CM | POA: Diagnosis not present

## 2017-05-28 DIAGNOSIS — C4A39 Merkel cell carcinoma of other parts of face: Secondary | ICD-10-CM | POA: Diagnosis not present

## 2017-05-28 DIAGNOSIS — Z7729 Contact with and (suspected ) exposure to other hazardous substances: Secondary | ICD-10-CM | POA: Diagnosis not present

## 2017-05-28 DIAGNOSIS — Z510 Encounter for antineoplastic radiation therapy: Secondary | ICD-10-CM | POA: Diagnosis not present

## 2017-05-29 DIAGNOSIS — C4A39 Merkel cell carcinoma of other parts of face: Secondary | ICD-10-CM | POA: Diagnosis not present

## 2017-05-29 DIAGNOSIS — Z510 Encounter for antineoplastic radiation therapy: Secondary | ICD-10-CM | POA: Diagnosis not present

## 2017-05-29 DIAGNOSIS — Z7729 Contact with and (suspected ) exposure to other hazardous substances: Secondary | ICD-10-CM | POA: Diagnosis not present

## 2017-05-30 DIAGNOSIS — Z510 Encounter for antineoplastic radiation therapy: Secondary | ICD-10-CM | POA: Diagnosis not present

## 2017-05-30 DIAGNOSIS — Z7729 Contact with and (suspected ) exposure to other hazardous substances: Secondary | ICD-10-CM | POA: Diagnosis not present

## 2017-05-30 DIAGNOSIS — C4A39 Merkel cell carcinoma of other parts of face: Secondary | ICD-10-CM | POA: Diagnosis not present

## 2017-05-31 DIAGNOSIS — C4A39 Merkel cell carcinoma of other parts of face: Secondary | ICD-10-CM | POA: Diagnosis not present

## 2017-05-31 DIAGNOSIS — C4A30 Merkel cell carcinoma of unspecified part of face: Secondary | ICD-10-CM | POA: Diagnosis not present

## 2017-05-31 DIAGNOSIS — Z510 Encounter for antineoplastic radiation therapy: Secondary | ICD-10-CM | POA: Diagnosis not present

## 2017-05-31 DIAGNOSIS — Z7729 Contact with and (suspected ) exposure to other hazardous substances: Secondary | ICD-10-CM | POA: Diagnosis not present

## 2017-06-01 DIAGNOSIS — C4A39 Merkel cell carcinoma of other parts of face: Secondary | ICD-10-CM | POA: Diagnosis not present

## 2017-06-01 DIAGNOSIS — Z510 Encounter for antineoplastic radiation therapy: Secondary | ICD-10-CM | POA: Diagnosis not present

## 2017-06-01 DIAGNOSIS — Z7729 Contact with and (suspected ) exposure to other hazardous substances: Secondary | ICD-10-CM | POA: Diagnosis not present

## 2017-06-04 DIAGNOSIS — C4A39 Merkel cell carcinoma of other parts of face: Secondary | ICD-10-CM | POA: Diagnosis not present

## 2017-06-04 DIAGNOSIS — Z510 Encounter for antineoplastic radiation therapy: Secondary | ICD-10-CM | POA: Diagnosis not present

## 2017-06-04 DIAGNOSIS — Z7729 Contact with and (suspected ) exposure to other hazardous substances: Secondary | ICD-10-CM | POA: Diagnosis not present

## 2017-06-05 DIAGNOSIS — Z510 Encounter for antineoplastic radiation therapy: Secondary | ICD-10-CM | POA: Diagnosis not present

## 2017-06-05 DIAGNOSIS — C4A39 Merkel cell carcinoma of other parts of face: Secondary | ICD-10-CM | POA: Diagnosis not present

## 2017-06-05 DIAGNOSIS — Z7729 Contact with and (suspected ) exposure to other hazardous substances: Secondary | ICD-10-CM | POA: Diagnosis not present

## 2017-06-06 DIAGNOSIS — Z7729 Contact with and (suspected ) exposure to other hazardous substances: Secondary | ICD-10-CM | POA: Diagnosis not present

## 2017-06-06 DIAGNOSIS — C4A39 Merkel cell carcinoma of other parts of face: Secondary | ICD-10-CM | POA: Diagnosis not present

## 2017-06-06 DIAGNOSIS — Z510 Encounter for antineoplastic radiation therapy: Secondary | ICD-10-CM | POA: Diagnosis not present

## 2017-06-07 DIAGNOSIS — Z510 Encounter for antineoplastic radiation therapy: Secondary | ICD-10-CM | POA: Diagnosis not present

## 2017-06-07 DIAGNOSIS — C4A39 Merkel cell carcinoma of other parts of face: Secondary | ICD-10-CM | POA: Diagnosis not present

## 2017-06-07 DIAGNOSIS — Z7729 Contact with and (suspected ) exposure to other hazardous substances: Secondary | ICD-10-CM | POA: Diagnosis not present

## 2017-06-07 DIAGNOSIS — C4A30 Merkel cell carcinoma of unspecified part of face: Secondary | ICD-10-CM | POA: Diagnosis not present

## 2017-06-08 DIAGNOSIS — Z510 Encounter for antineoplastic radiation therapy: Secondary | ICD-10-CM | POA: Diagnosis not present

## 2017-06-08 DIAGNOSIS — Z7729 Contact with and (suspected ) exposure to other hazardous substances: Secondary | ICD-10-CM | POA: Diagnosis not present

## 2017-06-08 DIAGNOSIS — C4A39 Merkel cell carcinoma of other parts of face: Secondary | ICD-10-CM | POA: Diagnosis not present

## 2017-06-11 DIAGNOSIS — Z510 Encounter for antineoplastic radiation therapy: Secondary | ICD-10-CM | POA: Diagnosis not present

## 2017-06-11 DIAGNOSIS — C4A39 Merkel cell carcinoma of other parts of face: Secondary | ICD-10-CM | POA: Diagnosis not present

## 2017-06-11 DIAGNOSIS — Z7729 Contact with and (suspected ) exposure to other hazardous substances: Secondary | ICD-10-CM | POA: Diagnosis not present

## 2017-06-12 DIAGNOSIS — C4A39 Merkel cell carcinoma of other parts of face: Secondary | ICD-10-CM | POA: Diagnosis not present

## 2017-06-12 DIAGNOSIS — Z7729 Contact with and (suspected ) exposure to other hazardous substances: Secondary | ICD-10-CM | POA: Diagnosis not present

## 2017-06-12 DIAGNOSIS — Z510 Encounter for antineoplastic radiation therapy: Secondary | ICD-10-CM | POA: Diagnosis not present

## 2017-06-13 DIAGNOSIS — Z7729 Contact with and (suspected ) exposure to other hazardous substances: Secondary | ICD-10-CM | POA: Diagnosis not present

## 2017-06-13 DIAGNOSIS — C4A39 Merkel cell carcinoma of other parts of face: Secondary | ICD-10-CM | POA: Diagnosis not present

## 2017-06-13 DIAGNOSIS — Z510 Encounter for antineoplastic radiation therapy: Secondary | ICD-10-CM | POA: Diagnosis not present

## 2017-06-14 DIAGNOSIS — C4A39 Merkel cell carcinoma of other parts of face: Secondary | ICD-10-CM | POA: Diagnosis not present

## 2017-06-14 DIAGNOSIS — C4A30 Merkel cell carcinoma of unspecified part of face: Secondary | ICD-10-CM | POA: Diagnosis not present

## 2017-06-14 DIAGNOSIS — Z7729 Contact with and (suspected ) exposure to other hazardous substances: Secondary | ICD-10-CM | POA: Diagnosis not present

## 2017-06-14 DIAGNOSIS — Z510 Encounter for antineoplastic radiation therapy: Secondary | ICD-10-CM | POA: Diagnosis not present

## 2017-06-15 DIAGNOSIS — C4A39 Merkel cell carcinoma of other parts of face: Secondary | ICD-10-CM | POA: Diagnosis not present

## 2017-06-15 DIAGNOSIS — Z510 Encounter for antineoplastic radiation therapy: Secondary | ICD-10-CM | POA: Diagnosis not present

## 2017-06-15 DIAGNOSIS — Z7729 Contact with and (suspected ) exposure to other hazardous substances: Secondary | ICD-10-CM | POA: Diagnosis not present

## 2017-06-18 DIAGNOSIS — Z510 Encounter for antineoplastic radiation therapy: Secondary | ICD-10-CM | POA: Diagnosis not present

## 2017-06-18 DIAGNOSIS — Z7729 Contact with and (suspected ) exposure to other hazardous substances: Secondary | ICD-10-CM | POA: Diagnosis not present

## 2017-06-18 DIAGNOSIS — C4A39 Merkel cell carcinoma of other parts of face: Secondary | ICD-10-CM | POA: Diagnosis not present

## 2017-06-19 DIAGNOSIS — C4A39 Merkel cell carcinoma of other parts of face: Secondary | ICD-10-CM | POA: Diagnosis not present

## 2017-06-19 DIAGNOSIS — Z510 Encounter for antineoplastic radiation therapy: Secondary | ICD-10-CM | POA: Diagnosis not present

## 2017-06-19 DIAGNOSIS — Z7729 Contact with and (suspected ) exposure to other hazardous substances: Secondary | ICD-10-CM | POA: Diagnosis not present

## 2017-06-20 DIAGNOSIS — Z510 Encounter for antineoplastic radiation therapy: Secondary | ICD-10-CM | POA: Diagnosis not present

## 2017-06-20 DIAGNOSIS — Z7729 Contact with and (suspected ) exposure to other hazardous substances: Secondary | ICD-10-CM | POA: Diagnosis not present

## 2017-06-20 DIAGNOSIS — C4A39 Merkel cell carcinoma of other parts of face: Secondary | ICD-10-CM | POA: Diagnosis not present

## 2017-06-21 DIAGNOSIS — Z7729 Contact with and (suspected ) exposure to other hazardous substances: Secondary | ICD-10-CM | POA: Diagnosis not present

## 2017-06-21 DIAGNOSIS — C4A39 Merkel cell carcinoma of other parts of face: Secondary | ICD-10-CM | POA: Diagnosis not present

## 2017-06-21 DIAGNOSIS — Z510 Encounter for antineoplastic radiation therapy: Secondary | ICD-10-CM | POA: Diagnosis not present

## 2017-06-21 DIAGNOSIS — C4A30 Merkel cell carcinoma of unspecified part of face: Secondary | ICD-10-CM | POA: Diagnosis not present

## 2017-06-22 ENCOUNTER — Other Ambulatory Visit: Payer: Self-pay | Admitting: Cardiovascular Disease

## 2017-06-22 DIAGNOSIS — Z510 Encounter for antineoplastic radiation therapy: Secondary | ICD-10-CM | POA: Diagnosis not present

## 2017-06-22 DIAGNOSIS — C4A39 Merkel cell carcinoma of other parts of face: Secondary | ICD-10-CM | POA: Diagnosis not present

## 2017-06-22 DIAGNOSIS — Z7729 Contact with and (suspected ) exposure to other hazardous substances: Secondary | ICD-10-CM | POA: Diagnosis not present

## 2017-06-25 DIAGNOSIS — Z7729 Contact with and (suspected ) exposure to other hazardous substances: Secondary | ICD-10-CM | POA: Diagnosis not present

## 2017-06-25 DIAGNOSIS — Z510 Encounter for antineoplastic radiation therapy: Secondary | ICD-10-CM | POA: Diagnosis not present

## 2017-06-25 DIAGNOSIS — C4A39 Merkel cell carcinoma of other parts of face: Secondary | ICD-10-CM | POA: Diagnosis not present

## 2017-06-26 DIAGNOSIS — Z510 Encounter for antineoplastic radiation therapy: Secondary | ICD-10-CM | POA: Diagnosis not present

## 2017-06-26 DIAGNOSIS — Z7729 Contact with and (suspected ) exposure to other hazardous substances: Secondary | ICD-10-CM | POA: Diagnosis not present

## 2017-06-26 DIAGNOSIS — C4A39 Merkel cell carcinoma of other parts of face: Secondary | ICD-10-CM | POA: Diagnosis not present

## 2017-06-27 DIAGNOSIS — Z510 Encounter for antineoplastic radiation therapy: Secondary | ICD-10-CM | POA: Diagnosis not present

## 2017-06-27 DIAGNOSIS — C4A39 Merkel cell carcinoma of other parts of face: Secondary | ICD-10-CM | POA: Diagnosis not present

## 2017-06-27 DIAGNOSIS — Z7729 Contact with and (suspected ) exposure to other hazardous substances: Secondary | ICD-10-CM | POA: Diagnosis not present

## 2017-06-28 DIAGNOSIS — C4A30 Merkel cell carcinoma of unspecified part of face: Secondary | ICD-10-CM | POA: Diagnosis not present

## 2017-06-28 DIAGNOSIS — C4A39 Merkel cell carcinoma of other parts of face: Secondary | ICD-10-CM | POA: Diagnosis not present

## 2017-06-28 DIAGNOSIS — Z7729 Contact with and (suspected ) exposure to other hazardous substances: Secondary | ICD-10-CM | POA: Diagnosis not present

## 2017-06-28 DIAGNOSIS — Z510 Encounter for antineoplastic radiation therapy: Secondary | ICD-10-CM | POA: Diagnosis not present

## 2017-06-29 DIAGNOSIS — C4A39 Merkel cell carcinoma of other parts of face: Secondary | ICD-10-CM | POA: Diagnosis not present

## 2017-06-29 DIAGNOSIS — Z7729 Contact with and (suspected ) exposure to other hazardous substances: Secondary | ICD-10-CM | POA: Diagnosis not present

## 2017-06-29 DIAGNOSIS — Z510 Encounter for antineoplastic radiation therapy: Secondary | ICD-10-CM | POA: Diagnosis not present

## 2017-07-02 DIAGNOSIS — C4A39 Merkel cell carcinoma of other parts of face: Secondary | ICD-10-CM | POA: Diagnosis not present

## 2017-07-02 DIAGNOSIS — Z7729 Contact with and (suspected ) exposure to other hazardous substances: Secondary | ICD-10-CM | POA: Diagnosis not present

## 2017-07-02 DIAGNOSIS — Z510 Encounter for antineoplastic radiation therapy: Secondary | ICD-10-CM | POA: Diagnosis not present

## 2017-07-03 DIAGNOSIS — Z7729 Contact with and (suspected ) exposure to other hazardous substances: Secondary | ICD-10-CM | POA: Diagnosis not present

## 2017-07-03 DIAGNOSIS — C4A39 Merkel cell carcinoma of other parts of face: Secondary | ICD-10-CM | POA: Diagnosis not present

## 2017-07-03 DIAGNOSIS — Z510 Encounter for antineoplastic radiation therapy: Secondary | ICD-10-CM | POA: Diagnosis not present

## 2017-07-04 DIAGNOSIS — C4A39 Merkel cell carcinoma of other parts of face: Secondary | ICD-10-CM | POA: Diagnosis not present

## 2017-07-04 DIAGNOSIS — Z510 Encounter for antineoplastic radiation therapy: Secondary | ICD-10-CM | POA: Diagnosis not present

## 2017-07-04 DIAGNOSIS — Z7729 Contact with and (suspected ) exposure to other hazardous substances: Secondary | ICD-10-CM | POA: Diagnosis not present

## 2017-07-18 DIAGNOSIS — E1159 Type 2 diabetes mellitus with other circulatory complications: Secondary | ICD-10-CM | POA: Diagnosis not present

## 2017-07-18 DIAGNOSIS — I2510 Atherosclerotic heart disease of native coronary artery without angina pectoris: Secondary | ICD-10-CM | POA: Diagnosis not present

## 2017-07-18 DIAGNOSIS — H409 Unspecified glaucoma: Secondary | ICD-10-CM | POA: Diagnosis not present

## 2017-07-18 DIAGNOSIS — R682 Dry mouth, unspecified: Secondary | ICD-10-CM | POA: Diagnosis not present

## 2017-07-18 DIAGNOSIS — I10 Essential (primary) hypertension: Secondary | ICD-10-CM | POA: Diagnosis not present

## 2017-07-18 DIAGNOSIS — E1136 Type 2 diabetes mellitus with diabetic cataract: Secondary | ICD-10-CM | POA: Diagnosis not present

## 2017-07-18 DIAGNOSIS — E785 Hyperlipidemia, unspecified: Secondary | ICD-10-CM | POA: Diagnosis not present

## 2017-07-18 DIAGNOSIS — Z794 Long term (current) use of insulin: Secondary | ICD-10-CM | POA: Diagnosis not present

## 2017-07-18 DIAGNOSIS — K08109 Complete loss of teeth, unspecified cause, unspecified class: Secondary | ICD-10-CM | POA: Diagnosis not present

## 2017-07-18 DIAGNOSIS — J309 Allergic rhinitis, unspecified: Secondary | ICD-10-CM | POA: Diagnosis not present

## 2017-08-02 DIAGNOSIS — H04123 Dry eye syndrome of bilateral lacrimal glands: Secondary | ICD-10-CM | POA: Diagnosis not present

## 2017-08-03 DIAGNOSIS — C4A39 Merkel cell carcinoma of other parts of face: Secondary | ICD-10-CM | POA: Diagnosis not present

## 2017-08-03 DIAGNOSIS — Z7729 Contact with and (suspected ) exposure to other hazardous substances: Secondary | ICD-10-CM | POA: Diagnosis not present

## 2017-08-03 DIAGNOSIS — Z510 Encounter for antineoplastic radiation therapy: Secondary | ICD-10-CM | POA: Diagnosis not present

## 2017-08-03 DIAGNOSIS — C4A30 Merkel cell carcinoma of unspecified part of face: Secondary | ICD-10-CM | POA: Diagnosis not present

## 2017-08-13 DIAGNOSIS — H01112 Allergic dermatitis of right lower eyelid: Secondary | ICD-10-CM | POA: Diagnosis not present

## 2017-08-13 DIAGNOSIS — H01111 Allergic dermatitis of right upper eyelid: Secondary | ICD-10-CM | POA: Diagnosis not present

## 2017-08-20 DIAGNOSIS — H01111 Allergic dermatitis of right upper eyelid: Secondary | ICD-10-CM | POA: Diagnosis not present

## 2017-09-18 ENCOUNTER — Ambulatory Visit (INDEPENDENT_AMBULATORY_CARE_PROVIDER_SITE_OTHER): Payer: Medicare HMO | Admitting: Cardiovascular Disease

## 2017-09-18 ENCOUNTER — Encounter: Payer: Self-pay | Admitting: Cardiovascular Disease

## 2017-09-18 VITALS — BP 180/76 | HR 63 | Ht 70.0 in | Wt 180.4 lb

## 2017-09-18 DIAGNOSIS — I2510 Atherosclerotic heart disease of native coronary artery without angina pectoris: Secondary | ICD-10-CM | POA: Diagnosis not present

## 2017-09-18 DIAGNOSIS — E785 Hyperlipidemia, unspecified: Secondary | ICD-10-CM | POA: Diagnosis not present

## 2017-09-18 DIAGNOSIS — Z794 Long term (current) use of insulin: Secondary | ICD-10-CM

## 2017-09-18 DIAGNOSIS — I440 Atrioventricular block, first degree: Secondary | ICD-10-CM | POA: Diagnosis not present

## 2017-09-18 DIAGNOSIS — Z951 Presence of aortocoronary bypass graft: Secondary | ICD-10-CM | POA: Diagnosis not present

## 2017-09-18 DIAGNOSIS — I10 Essential (primary) hypertension: Secondary | ICD-10-CM | POA: Diagnosis not present

## 2017-09-18 DIAGNOSIS — E118 Type 2 diabetes mellitus with unspecified complications: Secondary | ICD-10-CM

## 2017-09-18 NOTE — Progress Notes (Signed)
Patient ID: Nathan Howell, male   DOB: 1934/06/01, 83 y.o.   MRN: [id number]    Primary M.D.: Dr. Burnard Bunting  HPI: Nathan Howell is a 83 y.o. male who presents for a 83 month cardiology evaluation.  Nathan Howell has known CAD and on11/23/2005 underwent CABG surgery x3 by Dr. Servando Snare (LIMA to the LAD, vein to the intermediate, and into the distal circumflex). [name] was nondominant and not bypassed. Additional problems include type 2 diabetes mellitus, hypertension, as well as hyperlipidemia. His last nuclear perfusion study in 2012 which showed significant diaphragmatic attenuation but was without ischemia. Ejection fraction 65%. An echo Doppler study in November 2012 showed moderate concentric LVH with normal systolic function grade 1 diastolic dysfunction. He did have moderate LA dilatation, mild mitral annular calcification trace MR, trace TR, and aortic valve sclerosis.  Nathan Howell has continued to be stable from a cardiac standpoint. He denies recent episodes of chest pain. He remains active.  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  He also does all his own yard work.  He denies  palpitations. He denies presyncope or syncope.    He had undergone colonoscopy by Dr. Henrene Pastor and was found to have having diverticulitis.  He states his significant diarrhea markedly improved with a significant change in his diet.   When I last saw him in March 2018 he had  undergone eye surgery by Dr. Valetta Close for a glaucoma and some bleeding issues.   I reviewed his complete set of blood work from 06/21/2016.  His labs were stable.  BUN was 18, creatinine 0.8.  Hemoglobin and hematocrit were 13.5 and 40.5, respectively.  TSH and PSA were normal.  Total cholesterol was 131, triglycerides 62, HDL 37, LDL was 82.   Since I last saw him, he has remained stable and has been without chest pain or shortness of breath.  He had undergone a nuclear perfusion study in November 2018 which remain low risk  and showed normal perfusion and function.  EF was 59%.  He remains active cutting grass and also assisting his son, Nathan Howell, in his roofing business but he does not climb on the roof.  He underwent extensive surgery to the left side of his face/neck due to Merkel cell carcinoma.  He underwent 30 treatments of radiation to his lymph nodes.  He is unaware of palpitations, presyncope or syncope.  Past Medical History:  Diagnosis Date  . Abdominal hernia   . Asthma    [place] - HFA 50% 03/16/09 > 50% 05/13/10  . Collagenous colitis   . COPD (chronic obstructive pulmonary disease) ([place]) dx'd ~ 2015  . Coronary artery disease   . Diverticulosis   . GERD (gastroesophageal reflux disease)   . Glaucoma   . History of stress test 02/2011   Which showed mild inferior thinning felt to be diaphragmatic attenuation artifact without ichemia. Poststress EF was 65%.  [name] Hx of echocardiogram    EF>55% this showed moderate concentric LVH with normal systolic function. There was evidence for grade 1 diastolic relaxation abnormality with possible very small region of apical lateral hypokinesis.He did have moderate left atrial dilatation by volume assessment. There was mild mitral annular calcification with trace MR, trace TR, mild aortic valve sclerosis without stenosis    . Hyperlipidemia   . Hypertension   . Rhinitis medicamentosa    per [place] w/u  . Type II diabetes mellitus [facility])     Past Surgical  History:  Procedure Laterality Date  . CARDIAC CATHETERIZATION  02/2004   Normal left ventricular function. Significant multivessel coronary artery disease corresponding to left main equivalent disease with diffuse tubular narrowing of 50% in the proximal left anterior descending, 905 in the ramus intermadius vessel and 80% in the most proximal circumflex veseel with 70% mid A-V groove circumflex stenosis.  [name] CATARACT EXTRACTION W/ INTRAOCULAR LENS IMPLANT Right 2003   . CORNEAL TRANSPLANT Right ~  2004  . CORONARY ANGIOPLASTY    . CORONARY ARTERY BYPASS GRAFT     x3 By Dr Servando Snare with LIMA to the LAD, a vein to the intermediate, a vein to the distal circumflex. [name] was nondominant and not bypassed.  [name] GLAUCOMA VALVE INSERTION Right   . HAND SURGERY Right    "don't remember what was done"  . [place]   "not sure which side"  . KNEE ARTHROSCOPY Bilateral     No Known Allergies  Current Outpatient Medications  Medication Sig Dispense Refill  . ALPHAGAN P 0.1 % SOLN Place 1 drop into the right eye 2 (two) times daily.    [name] amLODipine (NORVASC) 5 MG tablet Take 5 mg by mouth daily.    [name] aspirin 81 MG tablet Take 1 tablet by mouth daily.    . dorzolamide (TRUSOPT) 2 % ophthalmic solution Place 1 drop into the right eye 2 (two) times daily.      [name] glipiZIDE (GLUCOTROL) 5 MG tablet Take 5 mg by mouth 2 (two) times daily before a meal.     . Insulin Detemir (LEVEMIR FLEXTOUCH) 100 UNIT/ML Pen Inject 20 Units into the skin daily. Or as directed 15 mL 11  . losartan (COZAAR) 100 MG tablet Take 100 mg by mouth daily.    [name] METFORMIN HCL ER PO Take 1,000 mg by mouth 2 (two) times daily.     . metoprolol tartrate (LOPRESSOR) 25 MG tablet Take 1 tablet (25 mg total) by mouth 2 (two) times daily. 180 tablet 3  . rosuvastatin (CRESTOR) 20 MG tablet Take 1 tablet (20 mg total) by mouth daily. PT OVERDUE FOR OV PLEASE CALL FOR APPT 90 tablet 0  . triamterene-hydrochlorothiazide (MAXZIDE-25) 37.5-25 MG tablet Take 1 tablet by mouth daily.     No current facility-administered medications for this visit.     Social History   Socioeconomic History  . Marital status: Married    Spouse name: Not on file  . Number of children: 3  . Years of education: Not on file  . Highest education level: Not on file  Occupational History  . Occupation: Retired  Scientific laboratory technician  . Financial resource strain: Not on file  . Food insecurity:    Worry: Not on file    Inability: Not on  file  . Transportation needs:    Medical: Not on file    Non-medical: Not on file  Tobacco Use  . Smoking status: Never Smoker  . Smokeless tobacco: Current User    Types: Snuff  Substance and Sexual Activity  . Alcohol use: No    Alcohol/week: 0.0 oz  . Drug use: No  . Sexual activity: Not Currently  Lifestyle  . Physical activity:    Days per week: Not on file    Minutes per session: Not on file  . Stress: Not on file  Relationships  . Social connections:    Talks on phone: Not on file    Gets together: Not on file  Attends religious service: Not on file    Active member of club or organization: Not on file    Attends meetings of clubs or organizations: Not on file    Relationship status: Not on file  . Intimate partner violence:    Fear of current or ex partner: Not on file    Emotionally abused: Not on file    Physically abused: Not on file    Forced sexual activity: Not on file  Other Topics Concern  . Not on file  Social History Narrative   Never smoked cigs regularly but stopped all exposure 62s   Lives with wife    Family History  Problem Relation Age of Onset  . Asthma Mother   . Emphysema Father   . Colon cancer Neg Hx    Social history is normal and that he is married has 3 children 4 grandchildren. He does try to exercise and remains active. There is no tobacco or alcohol use.  ROS General: Negative; No fevers, chills, or night sweats;  HEENT: Negative; No changes in vision or hearing, sinus congestion, difficulty swallowing Pulmonary: Negative; No cough, wheezing, shortness of breath, hemoptysis Cardiovascular: Negative; No chest pain, presyncope, syncope, palpitations GI: As if her recent diarrhea and diverticular disease. GU: Negative; No dysuria, hematuria, or difficulty voiding Musculoskeletal: Negative; no myalgias, joint pain, or weakness Hematologic/Oncology: Merkel cell carcinoma Endocrine: Negative; no heat/cold intolerance; no  diabetes Neuro: Negative; no changes in balance, headaches Skin: Negative; No rashes or skin lesions Psychiatric: Negative; No behavioral problems, depression Sleep: Negative; No snoring, daytime sleepiness, hypersomnolence, bruxism, restless legs, hypnogognic hallucinations, no cataplexy Other comprehensive 14 point system review is negative.  PE BP (!) 180/76   Pulse 63   Ht '5\' 10"'  (1.778 m)   Wt 180 lb 6.4 oz (81.8 kg)   BMI 25.88 kg/m    Repeat blood pressure 130/76 when taken by me.  No orthostatic change  Wt Readings from Last 3 Encounters:  09/18/17 180 lb 6.4 oz (81.8 kg)  02/20/17 191 lb (86.6 kg)  06/19/16 191 lb (86.6 kg)   General: Alert, oriented, no distress.  Skin: normal turgor, no rashes, warm and dry HEENT: Normocephalic, atraumatic. Pupils equal round and reactive to light; sclera anicteric; extraocular muscles intact;  Nose without nasal septal hypertrophy Mouth/Parynx benign; Mallinpatti scale 2 Neck: No JVD, no carotid bruits; normal carotid upstroke Lungs: clear to ausculatation and percussion; no wheezing or rales Chest wall: without tenderness to palpitation Heart: PMI not displaced, RRR, s1 s2 normal, 1/6 systolic murmur, no diastolic murmur, no rubs, gallops, thrills, or heaves Abdomen: soft, nontender; no hepatosplenomehaly, BS+; abdominal aorta nontender and not dilated by palpation. Back: no CVA tenderness Pulses 2+ Musculoskeletal: full range of motion, normal strength, no joint deformities Extremities: no clubbing cyanosis or edema, Homan's sign negative  Neurologic: grossly nonfocal; Cranial nerves grossly wnl Psychologic: Normal mood and affect  ECG (independently read by me): Normal sinus rhythm at 63 bpm.  First degree AV block.  June 19, 2016 ECG (independently read by me): Normal sinus rhythm at 64 bpm.  First-degree AV block with a PR interval at 24 ms.  Left axis deviation.  October 2017 ECG (independently read by me): Normal sinus  rhythm with first-degree AV block.  Nonspecific ST changes.  Normal intervals.  March 2017 ECG (independently read by me): Sinus rhythm with first-degree AV block with a pulse of 68 bpm.  PR interval 212 ms.  May 2016 ECG (independently read by  me): Normal sinus rhythm at 72 bpm.  Incomplete left bundle-branch block.  T-wave abnormality in aVL.  Normal intervals.  November 2015 ECG (independently read by me): Sinus rhythm with first-degree AV block with a heart rate at 80 bpm.  PR interval 228 ms.  T-wave changes in leads 1 and aVL.  Poor anterior R-wave progression.  Prior November 2014 ECG: Sinus rhythm with first-degree AV block. Poor progression anteroseptally. ST-T changes laterally, unchanged  LABS: Blood work done by Dr. Burnard Bunting at [facility] on 04/15/2015: BUN 7, creatinine 0.73.  Normal LFTs.  Potassium 4.6.  Glucose 104.  Hemoglobin 13.7, hematocrit 40.7.  White blood count 6.2.  Urinalysis negative.  Hemoglobin A1c 6.3.  Stage III 0.6.  PSA 0.4.  I personally reviewed the blood work from February, 2018 as noted above in the history of present illness.  BMP Latest Ref Rng & Units 02/18/2015 02/17/2015 02/16/2015  Glucose 65 - 99 mg/dL 99 97 126(H)  BUN 6 - 20 mg/dL 5(L) 6 9  Creatinine 0.61 - 1.24 mg/dL 0.63 0.66 0.63  Sodium 135 - 145 mmol/L 129(L) 129(L) 124(L)  Potassium 3.5 - 5.1 mmol/L 3.7 3.5 3.5  Chloride 101 - 111 mmol/L 101 96(L) 90(L)  CO2 22 - 32 mmol/L '22 26 28  ' Calcium 8.9 - 10.3 mg/dL 8.2(L) 8.5(L) 8.8(L)   Hepatic Function Latest Ref Rng & Units 02/16/2015  Total Protein 6.5 - 8.1 g/dL 7.2  Albumin 3.5 - 5.0 g/dL 4.0  AST 15 - 41 U/L 15  ALT 17 - 63 U/L 12(L)  Alk Phosphatase 38 - 126 U/L 44  Total Bilirubin 0.3 - 1.2 mg/dL 0.8   CBC Latest Ref Rng & Units 02/17/2015 02/16/2015 01/25/2013  WBC 4.0 - 10.5 K/uL 4.6 7.8 7.4  Hemoglobin 13.0 - 17.0 g/dL 12.4(L) 13.4 13.2  Hematocrit 39.0 - 52.0 % 34.0(L) 37.7(L) 37.0(L)  Platelets 150  - 400 K/uL 236 282 243   Lab Results  Component Value Date   MCV 87.6 02/17/2015   MCV 88.5 02/16/2015   MCV 89.2 01/25/2013    Lab Results  Component Value Date   TSH 3.274 02/17/2015    Lab Results  Component Value Date   HGBA1C 6.2 (H) 02/17/2015    RADIOLOGY: No results found.  IMPRESSION:  1. CAD in native artery   2. Hx of CABG   3. Essential hypertension   4. Hyperlipidemia with target LDL less than 70   5. Type 2 diabetes mellitus with complication, with long-term current use of insulin ([place])   6. First degree heart block     ASSESSMENT AND PLAN: Nathan Howell is an 27 -year-old white male who is  status post CABG surgery x3 in November 2005. His  stress test  in 2012 revealed mild inferior thinning felt to be due to diaphragmatic attenuation without ischemia.  His most recent stress study from February 20, 2017 continue to be low risk and showed normal perfusion with normal left ventricular regional and global systolic function.  EF was 59%.  He remained stable with reference to his CAD and is without anginal symptomatology.  He denies PND orthopnea.  Blood pressure today is controlled when rechecked by me although as soon as he had walked back to the room apparently his blood pressure was elevated.  He has been maintained on amlodipine 5 mg, losartan 100 mg and metoprolol 25 mg twice a day in addition to Maxide 25 daily.  There is no edema presently.  He has type 2 diabetes mellitus on glipizide, insulin, and metformin.  Hemoglobin A1c in February 2018 when checked by Dr. Reynaldo Minium was increased at 7.8.  He is on rosuvastatin 20 mg.  And laboratory done by Dr. Joya Salm had shown an LDL of 82.  Tells me he will be having repeat laboratory next week.  If his LDL is greater than 70 I would recommend further titration of rosuvastatin to 40 mg or add Zetia to his medical regimen.  I reviewed his records from [facility] regarding excision and treatment of his Merkel cell carcinoma  which appears to be stable.  I will see him in 6 months for evaluation..  Time spent: 25 minutes. Nathan Sine, Nathan, Nathan Howell  09/20/2017 7:19 PM

## 2017-09-18 NOTE — Patient Instructions (Signed)
Medication Instructions:  Your physician recommends that you continue on your current medications as directed. Please refer to the Current Medication list given to you today.  Follow-Up: 6 months with Dr. Claiborne Billings  Any Other Special Instructions Will Be Listed Below (If Applicable).     If you need a refill on your cardiac medications before your next appointment, please call your pharmacy.

## 2017-09-19 DIAGNOSIS — L589 Radiodermatitis, unspecified: Secondary | ICD-10-CM | POA: Diagnosis not present

## 2017-09-19 DIAGNOSIS — H906 Mixed conductive and sensorineural hearing loss, bilateral: Secondary | ICD-10-CM | POA: Diagnosis not present

## 2017-09-19 DIAGNOSIS — H6521 Chronic serous otitis media, right ear: Secondary | ICD-10-CM | POA: Diagnosis not present

## 2017-09-19 DIAGNOSIS — H6981 Other specified disorders of Eustachian tube, right ear: Secondary | ICD-10-CM | POA: Diagnosis not present

## 2017-09-20 ENCOUNTER — Encounter: Payer: Self-pay | Admitting: Cardiovascular Disease

## 2017-09-24 DIAGNOSIS — E7849 Other hyperlipidemia: Secondary | ICD-10-CM | POA: Diagnosis not present

## 2017-09-24 DIAGNOSIS — I10 Essential (primary) hypertension: Secondary | ICD-10-CM | POA: Diagnosis not present

## 2017-09-24 DIAGNOSIS — R82998 Other abnormal findings in urine: Secondary | ICD-10-CM | POA: Diagnosis not present

## 2017-09-24 DIAGNOSIS — Z125 Encounter for screening for malignant neoplasm of prostate: Secondary | ICD-10-CM | POA: Diagnosis not present

## 2017-09-24 DIAGNOSIS — E119 Type 2 diabetes mellitus without complications: Secondary | ICD-10-CM | POA: Diagnosis not present

## 2017-09-26 ENCOUNTER — Other Ambulatory Visit: Payer: Self-pay | Admitting: Cardiovascular Disease

## 2017-09-29 ENCOUNTER — Other Ambulatory Visit: Payer: Self-pay | Admitting: Cardiovascular Disease

## 2017-10-01 DIAGNOSIS — J449 Chronic obstructive pulmonary disease, unspecified: Secondary | ICD-10-CM | POA: Diagnosis not present

## 2017-10-01 DIAGNOSIS — E1169 Type 2 diabetes mellitus with other specified complication: Secondary | ICD-10-CM | POA: Diagnosis not present

## 2017-10-01 DIAGNOSIS — E7849 Other hyperlipidemia: Secondary | ICD-10-CM | POA: Diagnosis not present

## 2017-10-01 DIAGNOSIS — E663 Overweight: Secondary | ICD-10-CM | POA: Diagnosis not present

## 2017-10-01 DIAGNOSIS — E871 Hypo-osmolality and hyponatremia: Secondary | ICD-10-CM | POA: Diagnosis not present

## 2017-10-01 DIAGNOSIS — C4A30 Merkel cell carcinoma of unspecified part of face: Secondary | ICD-10-CM | POA: Diagnosis not present

## 2017-10-01 DIAGNOSIS — Z6826 Body mass index (BMI) 26.0-26.9, adult: Secondary | ICD-10-CM | POA: Diagnosis not present

## 2017-10-01 DIAGNOSIS — Z0000 Encounter for general adult medical examination without abnormal findings: Secondary | ICD-10-CM | POA: Diagnosis not present

## 2017-10-01 DIAGNOSIS — I10 Essential (primary) hypertension: Secondary | ICD-10-CM | POA: Diagnosis not present

## 2017-10-08 DIAGNOSIS — Z1212 Encounter for screening for malignant neoplasm of rectum: Secondary | ICD-10-CM | POA: Diagnosis not present

## 2017-10-18 DIAGNOSIS — H6521 Chronic serous otitis media, right ear: Secondary | ICD-10-CM | POA: Diagnosis not present

## 2017-10-18 DIAGNOSIS — H906 Mixed conductive and sensorineural hearing loss, bilateral: Secondary | ICD-10-CM | POA: Diagnosis not present

## 2017-10-18 DIAGNOSIS — H6981 Other specified disorders of Eustachian tube, right ear: Secondary | ICD-10-CM | POA: Diagnosis not present

## 2017-10-18 DIAGNOSIS — L589 Radiodermatitis, unspecified: Secondary | ICD-10-CM | POA: Diagnosis not present

## 2017-10-31 DIAGNOSIS — Z1283 Encounter for screening for malignant neoplasm of skin: Secondary | ICD-10-CM | POA: Diagnosis not present

## 2017-10-31 DIAGNOSIS — Z08 Encounter for follow-up examination after completed treatment for malignant neoplasm: Secondary | ICD-10-CM | POA: Diagnosis not present

## 2017-10-31 DIAGNOSIS — Z85828 Personal history of other malignant neoplasm of skin: Secondary | ICD-10-CM | POA: Diagnosis not present

## 2017-10-31 DIAGNOSIS — L258 Unspecified contact dermatitis due to other agents: Secondary | ICD-10-CM | POA: Diagnosis not present

## 2017-10-31 DIAGNOSIS — L570 Actinic keratosis: Secondary | ICD-10-CM | POA: Diagnosis not present

## 2017-10-31 DIAGNOSIS — X32XXXD Exposure to sunlight, subsequent encounter: Secondary | ICD-10-CM | POA: Diagnosis not present

## 2017-11-01 DIAGNOSIS — H01111 Allergic dermatitis of right upper eyelid: Secondary | ICD-10-CM | POA: Diagnosis not present

## 2017-11-01 DIAGNOSIS — H401113 Primary open-angle glaucoma, right eye, severe stage: Secondary | ICD-10-CM | POA: Diagnosis not present

## 2017-11-07 DIAGNOSIS — C4A39 Merkel cell carcinoma of other parts of face: Secondary | ICD-10-CM | POA: Diagnosis not present

## 2017-11-07 DIAGNOSIS — C4A30 Merkel cell carcinoma of unspecified part of face: Secondary | ICD-10-CM | POA: Diagnosis not present

## 2017-11-07 DIAGNOSIS — Z510 Encounter for antineoplastic radiation therapy: Secondary | ICD-10-CM | POA: Diagnosis not present

## 2017-11-07 DIAGNOSIS — Z7729 Contact with and (suspected ) exposure to other hazardous substances: Secondary | ICD-10-CM | POA: Diagnosis not present

## 2017-11-20 DIAGNOSIS — C4A30 Merkel cell carcinoma of unspecified part of face: Secondary | ICD-10-CM | POA: Diagnosis not present

## 2017-11-29 DIAGNOSIS — H6981 Other specified disorders of Eustachian tube, right ear: Secondary | ICD-10-CM | POA: Diagnosis not present

## 2017-11-29 DIAGNOSIS — H903 Sensorineural hearing loss, bilateral: Secondary | ICD-10-CM | POA: Diagnosis not present

## 2017-11-29 DIAGNOSIS — L589 Radiodermatitis, unspecified: Secondary | ICD-10-CM | POA: Diagnosis not present

## 2017-12-31 DIAGNOSIS — H903 Sensorineural hearing loss, bilateral: Secondary | ICD-10-CM | POA: Diagnosis not present

## 2017-12-31 DIAGNOSIS — H6981 Other specified disorders of Eustachian tube, right ear: Secondary | ICD-10-CM | POA: Diagnosis not present

## 2017-12-31 DIAGNOSIS — L589 Radiodermatitis, unspecified: Secondary | ICD-10-CM | POA: Diagnosis not present

## 2018-01-01 ENCOUNTER — Other Ambulatory Visit: Payer: Self-pay

## 2018-01-01 ENCOUNTER — Emergency Department (HOSPITAL_BASED_OUTPATIENT_CLINIC_OR_DEPARTMENT_OTHER): Payer: Medicare HMO

## 2018-01-01 ENCOUNTER — Encounter (HOSPITAL_BASED_OUTPATIENT_CLINIC_OR_DEPARTMENT_OTHER): Payer: Self-pay | Admitting: *Deleted

## 2018-01-01 ENCOUNTER — Emergency Department (HOSPITAL_BASED_OUTPATIENT_CLINIC_OR_DEPARTMENT_OTHER)
Admission: EM | Admit: 2018-01-01 | Discharge: 2018-01-01 | Disposition: A | Discharge status: Home / Self Care | Payer: Medicare HMO | Attending: Emergency Medicine | Admitting: Emergency Medicine

## 2018-01-01 DIAGNOSIS — Y999 Unspecified external cause status: Secondary | ICD-10-CM | POA: Diagnosis not present

## 2018-01-01 DIAGNOSIS — S41112A Laceration without foreign body of left upper arm, initial encounter: Secondary | ICD-10-CM | POA: Diagnosis not present

## 2018-01-01 DIAGNOSIS — I2510 Atherosclerotic heart disease of native coronary artery without angina pectoris: Secondary | ICD-10-CM | POA: Diagnosis not present

## 2018-01-01 DIAGNOSIS — S41111A Laceration without foreign body of right upper arm, initial encounter: Secondary | ICD-10-CM | POA: Diagnosis not present

## 2018-01-01 DIAGNOSIS — W19XXXA Unspecified fall, initial encounter: Secondary | ICD-10-CM

## 2018-01-01 DIAGNOSIS — W1789XA Other fall from one level to another, initial encounter: Secondary | ICD-10-CM | POA: Diagnosis not present

## 2018-01-01 DIAGNOSIS — Y939 Activity, unspecified: Secondary | ICD-10-CM | POA: Insufficient documentation

## 2018-01-01 DIAGNOSIS — E119 Type 2 diabetes mellitus without complications: Secondary | ICD-10-CM | POA: Diagnosis not present

## 2018-01-01 DIAGNOSIS — J449 Chronic obstructive pulmonary disease, unspecified: Secondary | ICD-10-CM | POA: Insufficient documentation

## 2018-01-01 DIAGNOSIS — S022XXA Fracture of nasal bones, initial encounter for closed fracture: Secondary | ICD-10-CM | POA: Insufficient documentation

## 2018-01-01 DIAGNOSIS — Z23 Encounter for immunization: Secondary | ICD-10-CM | POA: Diagnosis not present

## 2018-01-01 DIAGNOSIS — Z951 Presence of aortocoronary bypass graft: Secondary | ICD-10-CM | POA: Diagnosis not present

## 2018-01-01 DIAGNOSIS — Z7982 Long term (current) use of aspirin: Secondary | ICD-10-CM | POA: Diagnosis not present

## 2018-01-01 DIAGNOSIS — I10 Essential (primary) hypertension: Secondary | ICD-10-CM | POA: Diagnosis not present

## 2018-01-01 DIAGNOSIS — Y929 Unspecified place or not applicable: Secondary | ICD-10-CM | POA: Diagnosis not present

## 2018-01-01 DIAGNOSIS — Z794 Long term (current) use of insulin: Secondary | ICD-10-CM | POA: Diagnosis not present

## 2018-01-01 DIAGNOSIS — S0083XA Contusion of other part of head, initial encounter: Secondary | ICD-10-CM | POA: Diagnosis not present

## 2018-01-01 DIAGNOSIS — S0990XA Unspecified injury of head, initial encounter: Secondary | ICD-10-CM | POA: Diagnosis present

## 2018-01-01 DIAGNOSIS — T148XXA Other injury of unspecified body region, initial encounter: Secondary | ICD-10-CM | POA: Insufficient documentation

## 2018-01-01 MED ORDER — BACITRACIN ZINC 500 UNIT/GM EX OINT
TOPICAL_OINTMENT | Freq: Two times a day (BID) | CUTANEOUS | Status: DC
  Administered 2018-01-01: 22:00:00 via TOPICAL

## 2018-01-01 MED ORDER — BACITRACIN ZINC 500 UNIT/GM EX OINT
TOPICAL_OINTMENT | CUTANEOUS | Status: AC
  Filled 2018-01-01: qty 28.35

## 2018-01-01 MED ORDER — TETANUS-DIPHTH-ACELL PERTUSSIS 5-2.5-18.5 LF-MCG/0.5 IM SUSP
0.5000 mL | Freq: Once | INTRAMUSCULAR | Status: AC
  Administered 2018-01-01: 0.5 mL via INTRAMUSCULAR
  Filled 2018-01-01: qty 0.5

## 2018-01-01 NOTE — ED Triage Notes (Signed)
Pt c/o fall from standing x 1 hr ago landing on pavement, , abrasions to face and nose , and skin tear to right hand and right arm

## 2018-01-01 NOTE — ED Provider Notes (Signed)
[facility] Provider Note   CSN: [id number] Arrival date & time: 01/01/18  2045     History   Chief Complaint Chief Complaint  Patient presents with  . Fall    HPI Nathan Howell is a 83 y.o. male.  The history is provided by the patient, a relative and medical records. No language interpreter was used.  Fall  This is a new problem. The current episode started less than 1 hour ago. The problem occurs rarely. The problem has been resolved. Associated symptoms include headaches. Pertinent negatives include no chest pain, no abdominal pain and no shortness of breath. Nothing aggravates the symptoms. Nothing relieves the symptoms. He has tried nothing for the symptoms. The treatment provided no relief.    Past Medical History:  Diagnosis Date  . Abdominal hernia   . Asthma    [place] - HFA 50% 03/16/09 > 50% 05/13/10  . Collagenous colitis   . COPD (chronic obstructive pulmonary disease) ([place]) dx'd ~ 2015  . Coronary artery disease   . Diverticulosis   . GERD (gastroesophageal reflux disease)   . Glaucoma   . History of stress test 02/2011   Which showed mild inferior thinning felt to be diaphragmatic attenuation artifact without ichemia. Poststress EF was 65%.  [name] Hx of echocardiogram    EF>55% this showed moderate concentric LVH with normal systolic function. There was evidence for grade 1 diastolic relaxation abnormality with possible very small region of apical lateral hypokinesis.He did have moderate left atrial dilatation by volume assessment. There was mild mitral annular calcification with trace MR, trace TR, mild aortic valve sclerosis without stenosis    . Hyperlipidemia   . Hypertension   . Rhinitis medicamentosa    per remote [place] w/u  . Type II diabetes mellitus [facility])     Patient Active Problem List   Diagnosis Date Noted  . Hyponatremia 02/16/2015  . Generalized weakness 02/16/2015  . Headache 02/16/2015  . Asthma 02/16/2015   . COPD (chronic obstructive pulmonary disease) ([place]) 02/16/2015  . Glaucoma 02/16/2015  . Diabetes mellitus without complication ([place])   . CAD (coronary artery disease) 02/04/2013  . HTN (hypertension) 02/04/2013  . Hyperlipidemia with target LDL less than 70 02/04/2013  . Cough 05/06/2010  . DIABETES MELLITUS, TYPE II 11/03/2009  . WEIGHT LOSS-ABNORMAL 11/03/2009  . Diarrhea 11/03/2009  . ALLERGIC RHINITIS 03/01/2009  . Cough variant asthma 03/01/2009    Past Surgical History:  Procedure Laterality Date  . CARDIAC CATHETERIZATION  02/2004   Normal left ventricular function. Significant multivessel coronary artery disease corresponding to left main equivalent disease with diffuse tubular narrowing of 50% in the proximal left anterior descending, 905 in the ramus intermadius vessel and 80% in the most proximal circumflex veseel with 70% mid A-V groove circumflex stenosis.  [name] CATARACT EXTRACTION W/ INTRAOCULAR LENS IMPLANT Right 2003   . CORNEAL TRANSPLANT Right ~ 2004  . CORONARY ANGIOPLASTY    . CORONARY ARTERY BYPASS GRAFT     x3 By Dr Servando Snare with LIMA to the LAD, a vein to the intermediate, a vein to the distal circumflex. [name] was nondominant and not bypassed.  [name] GLAUCOMA VALVE INSERTION Right   . HAND SURGERY Right    "don't remember what was done"  . [place]   "not sure which side"  . KNEE ARTHROSCOPY Bilateral         Home Medications    Prior to Admission medications  Medication Sig Start Date End Date Taking? Authorizing Provider  ALPHAGAN P 0.1 % SOLN Place 1 drop into the right eye 2 (two) times daily. 12/05/13   [provider]  amLODipine (NORVASC) 5 MG tablet Take 5 mg by mouth daily.    [provider]  aspirin 81 MG tablet Take 1 tablet by mouth daily. 11/18/09   [provider]  dorzolamide (TRUSOPT) 2 % ophthalmic solution Place 1 drop into the right eye 2 (two) times daily.      [provider]    glipiZIDE (GLUCOTROL) 5 MG tablet Take 5 mg by mouth 2 (two) times daily before a meal.     [provider]  Insulin Detemir (LEVEMIR FLEXTOUCH) 100 UNIT/ML Pen Inject 20 Units into the skin daily. Or as directed 02/18/15   Dhungel, Nishant, MD  losartan (COZAAR) 100 MG tablet Take 100 mg by mouth daily.    [provider]  METFORMIN HCL ER PO Take 1,000 mg by mouth 2 (two) times daily.     [provider]  metoprolol tartrate (LOPRESSOR) 25 MG tablet Take 1 tablet (25 mg total) by mouth 2 (two) times daily. 09/27/17   Troy Sine, MD  rosuvastatin (CRESTOR) 20 MG tablet TAKE 1 TABLET DAILY 10/01/17   Troy Sine, MD  triamterene-hydrochlorothiazide (MAXZIDE-25) 37.5-25 MG tablet Take 1 tablet by mouth daily.    [provider]    Family History Family History  Problem Relation Age of Onset  . Asthma Mother   . Emphysema Father   . Colon cancer Neg Hx     Social History Social History   Tobacco Use  . Smoking status: Never Smoker  . Smokeless tobacco: Current User    Types: Snuff  Substance Use Topics  . Alcohol use: No    Alcohol/week: 0.0 standard drinks  . Drug use: No     Allergies   Patient has no known allergies.   Review of Systems Review of Systems  Constitutional: Negative for activity change, chills, diaphoresis, fatigue and fever.  HENT: Positive for sinus pain. Negative for congestion, ear discharge, ear pain, nosebleeds, rhinorrhea, sore throat, tinnitus, trouble swallowing and voice change.   Eyes: Negative for pain and visual disturbance.  Respiratory: Negative for cough, chest tightness, shortness of breath, wheezing and stridor.   Cardiovascular: Negative for chest pain, palpitations and leg swelling.  Gastrointestinal: Negative for abdominal distention, abdominal pain, blood in stool, constipation, diarrhea, nausea and vomiting.  Genitourinary: Negative for difficulty urinating, dysuria and flank pain.   Musculoskeletal: Negative for back pain and gait problem.  Skin: Positive for wound. Negative for rash.  Neurological: Positive for headaches. Negative for dizziness, syncope, facial asymmetry, speech difficulty, weakness, light-headedness and numbness.  Psychiatric/Behavioral: Negative for agitation.  All other systems reviewed and are negative.    Physical Exam Updated Vital Signs BP (!) 167/82 (BP Location: Left Arm)   Pulse 73   Temp 97.8 F (36.6 C) (Oral)   Resp 18   Ht 5\' 10"  (1.778 m)   Wt 83.5 kg   SpO2 99%   BMI 26.40 kg/m   Physical Exam  Constitutional: He is oriented to person, place, and time. He appears well-developed and well-nourished. No distress.  HENT:  Head: Head is with abrasion and with contusion. Head is without laceration.    Mouth/Throat: Oropharynx is clear and moist. No oropharyngeal exudate.  Patient has hematoma over the left eyebrow and some abrasion.  Abrasion over the bridge  of the nose.  Tenderness.  No nasal septal hematoma.  No significant bleeding.  Normal extraocular movements.  Stable face.  Ears unremarkable.  No trismus.  No evidence of oral injury.  Eyes: Pupils are equal, round, and reactive to light. Conjunctivae and EOM are normal.  Neck: Normal range of motion. Neck supple.  Cardiovascular: Normal rate and regular rhythm.  No murmur heard. Pulmonary/Chest: Effort normal and breath sounds normal. No respiratory distress. He has no wheezes. He has no rales. He exhibits no tenderness.  Abdominal: Soft. He exhibits no distension. There is no tenderness.  Musculoskeletal: He exhibits no edema or tenderness.  Lymphadenopathy:    He has no cervical adenopathy.  Neurological: He is alert and oriented to person, place, and time. No cranial nerve deficit or sensory deficit. He exhibits normal muscle tone. Coordination normal.  Skin: Skin is warm and dry. Capillary refill takes less than 2 seconds. He is not diaphoretic. No erythema. No  pallor.  Patient has multiple skin tears on both extremities.  No wounds appear deep or would be amenable to suturing.  All her skin tears.  Normal grip strength bilaterally.  Normal pulses bilaterally.  No tenderness in hands or arms.  Exam otherwise normal.  Psychiatric: He has a normal mood and affect.  Nursing note and vitals reviewed.    ED Treatments / Results  Labs (all labs ordered are listed, but only abnormal results are displayed) Labs Reviewed - No data to display  EKG None  Radiology Ct Head Wo Contrast  Result Date: 01/01/2018 CLINICAL DATA:  Fall 1 hour ago, left nasal and frontal injury, headache EXAM: CT HEAD WITHOUT CONTRAST CT MAXILLOFACIAL WITHOUT CONTRAST TECHNIQUE: Multidetector CT imaging of the head and maxillofacial structures were performed using the standard protocol without intravenous contrast. Multiplanar CT image reconstructions of the maxillofacial structures were also generated. COMPARISON:  None. 02/16/2015 FINDINGS: CT HEAD FINDINGS Brain: Stable mild atrophy pattern and chronic white matter microvascular changes about the ventricles. Encephalomalacia in the posterior left frontal lobe as before from a remote infarct. No significant interval change. No acute intracranial hemorrhage, mass lesion, new infarction, midline shift, herniation, hydrocephalus, or extra-axial fluid collection. No focal mass effect or edema. No cerebellar abnormality. Vascular: Intracranial atherosclerosis. No hyperdense vessel. Skull: Intact skull. Acute nasal bone fractures anteriorly. Extensive chronic appearing sinus mucosal thickening with an air-fluid level in the left maxillary sinus compatible with acute on chronic sinusitis. Left anterior frontal small scalp hematoma. Mastoid effusions bilaterally. Sinuses/Orbits: Symmetric orbits. Chronic sinus disease as above. Other: None. CT MAXILLOFACIAL FINDINGS Osseous: Midline anterior acute minimally displaced nasal bone fractures. Other  facial bones appear intact. Specifically, the mandible, maxilla, pterygoid plates, zygomas, skull base, and orbits appear intact. Orbits: No acute finding. No proptosis. No orbital hematoma. Orbits are intact. No blow-out fracture. Sinuses: Extensive sinus mucosal thickening of the sinuses with a few scattered air-fluid levels compatible with acute on chronic sinusitis. Soft tissues: Left anterior frontal soft tissue swelling/bruising noted extends along the left nasal bridge. IMPRESSION: Stable head CT without contrast. Chronic atrophy, microvascular changes, a remote left frontal infarct with encephalomalacia. No acute intracranial abnormality Anterior midline acute nasal bone fractures with minimal displacement Acute on chronic pansinusitis with scattered air-fluid levels. Electronically Signed   By: Jerilynn Mages.  Shick M.D.   On: 01/01/2018 22:14   Ct Maxillofacial Wo Contrast  Result Date: 01/01/2018 CLINICAL DATA:  Fall 1 hour ago, left nasal and frontal injury, headache EXAM: CT HEAD WITHOUT CONTRAST CT MAXILLOFACIAL  WITHOUT CONTRAST TECHNIQUE: Multidetector CT imaging of the head and maxillofacial structures were performed using the standard protocol without intravenous contrast. Multiplanar CT image reconstructions of the maxillofacial structures were also generated. COMPARISON:  None. 02/16/2015 FINDINGS: CT HEAD FINDINGS Brain: Stable mild atrophy pattern and chronic white matter microvascular changes about the ventricles. Encephalomalacia in the posterior left frontal lobe as before from a remote infarct. No significant interval change. No acute intracranial hemorrhage, mass lesion, new infarction, midline shift, herniation, hydrocephalus, or extra-axial fluid collection. No focal mass effect or edema. No cerebellar abnormality. Vascular: Intracranial atherosclerosis. No hyperdense vessel. Skull: Intact skull. Acute nasal bone fractures anteriorly. Extensive chronic appearing sinus mucosal thickening with an  air-fluid level in the left maxillary sinus compatible with acute on chronic sinusitis. Left anterior frontal small scalp hematoma. Mastoid effusions bilaterally. Sinuses/Orbits: Symmetric orbits. Chronic sinus disease as above. Other: None. CT MAXILLOFACIAL FINDINGS Osseous: Midline anterior acute minimally displaced nasal bone fractures. Other facial bones appear intact. Specifically, the mandible, maxilla, pterygoid plates, zygomas, skull base, and orbits appear intact. Orbits: No acute finding. No proptosis. No orbital hematoma. Orbits are intact. No blow-out fracture. Sinuses: Extensive sinus mucosal thickening of the sinuses with a few scattered air-fluid levels compatible with acute on chronic sinusitis. Soft tissues: Left anterior frontal soft tissue swelling/bruising noted extends along the left nasal bridge. IMPRESSION: Stable head CT without contrast. Chronic atrophy, microvascular changes, a remote left frontal infarct with encephalomalacia. No acute intracranial abnormality Anterior midline acute nasal bone fractures with minimal displacement Acute on chronic pansinusitis with scattered air-fluid levels. Electronically Signed   By: Jerilynn Mages.  Shick M.D.   On: 01/01/2018 22:14    Procedures Procedures (including critical care time)  Medications Ordered in ED Medications  bacitracin ointment ( Topical Not Given 01/01/18 2236)  Tdap (BOOSTRIX) injection 0.5 mL (0.5 mLs Intramuscular Given 01/01/18 2219)     Initial Impression / Assessment and Plan / ED Course  I have reviewed the triage vital signs and the nursing notes.  Pertinent labs & imaging results that were available during my care of the patient were reviewed by me and considered in my medical decision making (see chart for details).     Nathan Howell is a 83 y.o. male with a past medical history significant for CAD status post CABG, diabetes, COPD, hypertension, hyperlipidemia who presents with a fall.  Patient reports that he was  carrying something in both hands when he tripped over a cushion falling onto a concrete floor.  He reports he did not lose consciousness but struck his nose in his arms on the ground.  He reports some headache and swelling on his left eyebrow.  He reports some bleeding on his nose and his arms.  He is right-handed.  He reports no neck pain, neck stiffness, chest pain, nausea, vomiting, vision changes, or any neurologic deficits.  He reports his pain is mild to moderate in his face.  He reports his jaw is feeling normal and he is having no difficulty breathing or swallowing.  Next  On exam, patient is skin tears in his bilateral arms.  Normal sensation and grip strength in extremities.  Normal pulses.  No lacerations are seen that are amenable to sutures.  No foreign bodies are seen when washed out.  Patient's face has tenderness to the nose.  There is an abrasion where the patient's glasses were sitting.  No deep laceration on the nose.  There is contusion and abrasion to the forehead.  Normal  extraocular movements with no evidence of entrapment.  Normal pupils.  No nasal septal hematoma.  No neck tenderness.  Exam otherwise unremarkable.  Lungs clear and chest nontender.  CT head obtained given the obvious trauma to the head and face.  CT face also obtained.  Patient has a minimally displaced nasal fracture with no nasal septal hematoma.  No other significant adrenal injury seen.    After reassuring imaging, patient's wounds were cleaned and dressed.  Patient updated his tetanus and will follow-up with his PCP in several days.  He understood return precautions and to watch for infections.  He was informed of delayed bleeding risk and instructions for managing pains.  Patient understood and had no other questions or concerns.  Patient discharged in good condition.   Final Clinical Impressions(s) / ED Diagnoses   Final diagnoses:  Fall, initial encounter  Contusion of face, initial encounter  Multiple  skin tears  Closed fracture of nasal bone, initial encounter    ED Discharge Orders    None     Clinical Impression: 1. Fall, initial encounter   2. Contusion of face, initial encounter   3. Multiple skin tears   4. Closed fracture of nasal bone, initial encounter     Disposition: Discharge  Condition: Good  I have discussed the results, Dx and Tx plan with the pt(& family if present). He/she/they expressed understanding and agree(s) with the plan. Discharge instructions discussed at great length. Strict return precautions discussed and pt &/or family have verbalized understanding of the instructions. No further questions at time of discharge.    Discharge Medication List as of 01/01/2018 10:55 PM      Follow Up: Jerrell [place], [address] [place] [address] [phone]        Malijah Lietz, Gwenyth Allegra, MD 01/02/18 [phone]

## 2018-01-01 NOTE — ED Notes (Signed)
ED Provider at bedside. 

## 2018-01-01 NOTE — ED Notes (Signed)
Per EDP verbal wound care, after cleansing of wounds, dressings applied.  Mepitel dressing placed to right lateral forearm, 4x4 and kerlix. Right posterior hand, steri strips placed, Mepitel dressing, 4x4 and kerlix dressing placed. Left posterior forearm steri strips placed, Mepitel dressing, 4x4 and kerlix. Bacitracin to left elbow and band aid. Bacitracin to forehead and nose. Wound care instructions provided, pt verbalized understanding.

## 2018-01-01 NOTE — Discharge Instructions (Signed)
Your imaging today did not show evidence of bleeding inside your head however we did find evidence of a small nasal fracture.  I suspect your glasses caused the small abrasion to your nose and your fall because the skin tears on her arms.  They were cleaned and dressed.  Please follow-up with your primary doctor as we discussed.  Please follow-up with the ENT doctors if you have worsened nasal symptoms for nasal fracture.  Please stay hydrated.  If any symptoms change or worsen, please return to the nearest emergency department.  Please watch for signs and symptoms of infection.

## 2018-01-04 DIAGNOSIS — S61401A Unspecified open wound of right hand, initial encounter: Secondary | ICD-10-CM | POA: Diagnosis not present

## 2018-01-04 DIAGNOSIS — S0031XA Abrasion of nose, initial encounter: Secondary | ICD-10-CM | POA: Diagnosis not present

## 2018-01-04 DIAGNOSIS — S41101A Unspecified open wound of right upper arm, initial encounter: Secondary | ICD-10-CM | POA: Diagnosis not present

## 2018-01-04 DIAGNOSIS — Z6826 Body mass index (BMI) 26.0-26.9, adult: Secondary | ICD-10-CM | POA: Diagnosis not present

## 2018-01-10 DIAGNOSIS — S0031XD Abrasion of nose, subsequent encounter: Secondary | ICD-10-CM | POA: Diagnosis not present

## 2018-01-10 DIAGNOSIS — S61401D Unspecified open wound of right hand, subsequent encounter: Secondary | ICD-10-CM | POA: Diagnosis not present

## 2018-01-10 DIAGNOSIS — S41101D Unspecified open wound of right upper arm, subsequent encounter: Secondary | ICD-10-CM | POA: Diagnosis not present

## 2018-01-17 DIAGNOSIS — Z4800 Encounter for change or removal of nonsurgical wound dressing: Secondary | ICD-10-CM | POA: Diagnosis not present

## 2018-01-17 DIAGNOSIS — S41101S Unspecified open wound of right upper arm, sequela: Secondary | ICD-10-CM | POA: Diagnosis not present

## 2018-01-17 DIAGNOSIS — S61401S Unspecified open wound of right hand, sequela: Secondary | ICD-10-CM | POA: Diagnosis not present

## 2018-01-25 DIAGNOSIS — S41101S Unspecified open wound of right upper arm, sequela: Secondary | ICD-10-CM | POA: Diagnosis not present

## 2018-01-25 DIAGNOSIS — Z6826 Body mass index (BMI) 26.0-26.9, adult: Secondary | ICD-10-CM | POA: Diagnosis not present

## 2018-01-25 DIAGNOSIS — S61401S Unspecified open wound of right hand, sequela: Secondary | ICD-10-CM | POA: Diagnosis not present

## 2018-01-25 DIAGNOSIS — Z4800 Encounter for change or removal of nonsurgical wound dressing: Secondary | ICD-10-CM | POA: Diagnosis not present

## 2018-02-06 DIAGNOSIS — C4A30 Merkel cell carcinoma of unspecified part of face: Secondary | ICD-10-CM | POA: Diagnosis not present

## 2018-02-11 DIAGNOSIS — C4A30 Merkel cell carcinoma of unspecified part of face: Secondary | ICD-10-CM | POA: Diagnosis not present

## 2018-02-11 DIAGNOSIS — Z23 Encounter for immunization: Secondary | ICD-10-CM | POA: Diagnosis not present

## 2018-02-11 DIAGNOSIS — I10 Essential (primary) hypertension: Secondary | ICD-10-CM | POA: Diagnosis not present

## 2018-02-11 DIAGNOSIS — Z6826 Body mass index (BMI) 26.0-26.9, adult: Secondary | ICD-10-CM | POA: Diagnosis not present

## 2018-02-11 DIAGNOSIS — E7849 Other hyperlipidemia: Secondary | ICD-10-CM | POA: Diagnosis not present

## 2018-02-11 DIAGNOSIS — E871 Hypo-osmolality and hyponatremia: Secondary | ICD-10-CM | POA: Diagnosis not present

## 2018-02-11 DIAGNOSIS — E663 Overweight: Secondary | ICD-10-CM | POA: Diagnosis not present

## 2018-02-11 DIAGNOSIS — E1169 Type 2 diabetes mellitus with other specified complication: Secondary | ICD-10-CM | POA: Diagnosis not present

## 2018-02-11 DIAGNOSIS — J449 Chronic obstructive pulmonary disease, unspecified: Secondary | ICD-10-CM | POA: Diagnosis not present

## 2018-03-12 DIAGNOSIS — H401113 Primary open-angle glaucoma, right eye, severe stage: Secondary | ICD-10-CM | POA: Diagnosis not present

## 2018-03-12 DIAGNOSIS — H524 Presbyopia: Secondary | ICD-10-CM | POA: Diagnosis not present

## 2018-03-20 ENCOUNTER — Ambulatory Visit (INDEPENDENT_AMBULATORY_CARE_PROVIDER_SITE_OTHER): Payer: Medicare HMO | Admitting: Cardiovascular Disease

## 2018-03-20 VITALS — BP 174/80 | HR 68 | Ht 70.0 in | Wt 182.6 lb

## 2018-03-20 DIAGNOSIS — I2510 Atherosclerotic heart disease of native coronary artery without angina pectoris: Secondary | ICD-10-CM | POA: Diagnosis not present

## 2018-03-20 DIAGNOSIS — Z794 Long term (current) use of insulin: Secondary | ICD-10-CM | POA: Diagnosis not present

## 2018-03-20 DIAGNOSIS — E118 Type 2 diabetes mellitus with unspecified complications: Secondary | ICD-10-CM | POA: Diagnosis not present

## 2018-03-20 DIAGNOSIS — I10 Essential (primary) hypertension: Secondary | ICD-10-CM | POA: Diagnosis not present

## 2018-03-20 DIAGNOSIS — Z951 Presence of aortocoronary bypass graft: Secondary | ICD-10-CM

## 2018-03-20 DIAGNOSIS — I440 Atrioventricular block, first degree: Secondary | ICD-10-CM | POA: Diagnosis not present

## 2018-03-20 DIAGNOSIS — E785 Hyperlipidemia, unspecified: Secondary | ICD-10-CM | POA: Diagnosis not present

## 2018-03-20 NOTE — Patient Instructions (Signed)

## 2018-03-20 NOTE — Progress Notes (Signed)
Patient ID: Nathan Howell, male   DOB: [birth year], 83 y.o.   MRN: [id number]    Primary M.D.: Nathan Howell  HPI: Nathan Howell is a 83 y.o. male who presents for a 6 month cardiology evaluation.  Nathan Howell has known CAD and on11/23/2005 underwent CABG surgery x3 by Nathan Howell (LIMA to the LAD, vein to the intermediate, and into the distal circumflex). [name] was nondominant and not bypassed. Additional problems include type 2 diabetes mellitus, hypertension, as well as hyperlipidemia. His last nuclear perfusion study in 2012 which showed significant diaphragmatic attenuation but was without ischemia. Ejection fraction 65%. An echo Doppler study in November 2012 showed moderate concentric LVH with normal systolic function grade 1 diastolic dysfunction. He did have moderate LA dilatation, mild mitral annular calcification trace MR, trace TR, and aortic valve sclerosis.  Nathan Howell has continued to be stable from a cardiac standpoint. He denies recent episodes of chest pain. He remains active.  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  He also does all his own yard work.  He denies  palpitations. He denies presyncope or syncope.    He had undergone colonoscopy by Nathan Howell and was found to have having diverticulitis.  He states his significant diarrhea markedly improved with a significant change in his diet.  When I saw him in March 2018 he had  undergone eye surgery by Nathan Howell for a glaucoma and some bleeding issues.   I reviewed his complete set of blood work from 06/21/2016.  His labs were stable.  BUN was 18, creatinine 0.8.  Hemoglobin and hematocrit were 13.5 and 40.5, respectively.  TSH and PSA were normal.  Total cholesterol was 131, triglycerides 62, HDL 37, LDL was 82.   He had undergone a nuclear perfusion study in November 2018 which remained low risk and showed normal perfusion and function.  EF was 59%.  He remains active cutting grass and also assisting  his son, Nathan Howell, in his roofing business but he does not climb on the roof.  He underwent extensive surgery to the left side of his face/neck due to Merkel cell carcinoma.  He underwent 30 treatments of radiation to his lymph nodes.  He is unaware of palpitations, presyncope or syncope.  I last saw him in June 2019.  He has remained active.  He has gone for up of his Merkel cell [place] of his face at [facility].  He was evaluated in the emergency room October 2019 after a fall when he tripped over a cushion falling onto a concrete floor.  He did not lose consciousness but did sustain some tears to CT of his head was unremarkable.  There was a minimally displaced nasal fracture.  Contusions on his face.  He denies any recent chest pain or palpitations.  He has continued to be on metoprolol 25 mg twice a day, amlodipine 5 mg daily and losartan 100 mg daily in addition to [facility] for hypertension.  He denies significant swelling.  He is on rosuvastatin for hyperlipidemia.  He is on glipizide and insulin for diabetes.  He presents for evaluation.  Past Medical History:  Diagnosis Date  . Abdominal hernia   . Asthma    [place] - HFA 50% 03/16/09 > 50% 05/13/10  . Collagenous colitis   . COPD (chronic obstructive pulmonary disease) ([place]) dx'd ~ 2015  . Coronary artery disease   . Diverticulosis   . GERD (gastroesophageal reflux disease)   .  Glaucoma   . History of stress test 02/2011   Which showed mild inferior thinning felt to be diaphragmatic attenuation artifact without ichemia. Poststress EF was 65%.  [name] Hx of echocardiogram    EF>55% this showed moderate concentric LVH with normal systolic function. There was evidence for grade 1 diastolic relaxation abnormality with possible very small region of apical lateral hypokinesis.He did have moderate left atrial dilatation by volume assessment. There was mild mitral annular calcification with trace MR, trace TR, mild aortic valve sclerosis without stenosis      . Hyperlipidemia   . Hypertension   . Rhinitis medicamentosa    per remote [place] w/u  . Type II diabetes mellitus ([place])     Past Surgical History:  Procedure Laterality Date  . CARDIAC CATHETERIZATION  02/2004   Normal left ventricular function. Significant multivessel coronary artery disease corresponding to left main equivalent disease with diffuse tubular narrowing of 50% in the proximal left anterior descending, 905 in the ramus intermadius vessel and 80% in the most proximal circumflex veseel with 70% mid A-V groove circumflex stenosis.  [name] CATARACT EXTRACTION W/ INTRAOCULAR LENS IMPLANT Right 2003   . CORNEAL TRANSPLANT Right ~ 2004  . CORONARY ANGIOPLASTY    . CORONARY ARTERY BYPASS GRAFT     x3 By Dr Servando Howell with LIMA to the LAD, a vein to the intermediate, a vein to the distal circumflex. [name] was nondominant and not bypassed.  [name] GLAUCOMA VALVE INSERTION Right   . HAND SURGERY Right    "don't remember what was done"  . [place]   "not sure which side"  . KNEE ARTHROSCOPY Bilateral     No Known Allergies  Current Outpatient Medications  Medication Sig Dispense Refill  . amLODipine (NORVASC) 5 MG tablet Take 5 mg by mouth daily.    [name] aspirin 81 MG tablet Take 1 tablet by mouth daily.    . dorzolamide (TRUSOPT) 2 % ophthalmic solution Place 1 drop into the right eye 2 (two) times daily.      [name] glipiZIDE (GLUCOTROL) 5 MG tablet Take 5 mg by mouth 2 (two) times daily before a meal.     . Insulin Glargine, 1 Unit Dial, (TOUJEO SOLOSTAR) 300 UNIT/ML SOPN Inject 14 Units into the skin every morning.    [name] losartan (COZAAR) 100 MG tablet Take 100 mg by mouth daily.    [name] METFORMIN HCL ER PO Take 1,000 mg by mouth 2 (two) times daily.     . metoprolol tartrate (LOPRESSOR) 25 MG tablet Take 1 tablet (25 mg total) by mouth 2 (two) times daily. 180 tablet 3  . rosuvastatin (CRESTOR) 20 MG tablet TAKE 1 TABLET DAILY 90 tablet 3  .  triamterene-hydrochlorothiazide (MAXZIDE-25) 37.5-25 MG tablet Take 1 tablet by mouth daily.     No current facility-administered medications for this visit.     Social History   Socioeconomic History  . Marital status: Married    Spouse name: Not on file  . Number of children: 3  . Years of education: Not on file  . Highest education level: Not on file  Occupational History  . Occupation: Retired  Scientific laboratory technician  . Financial resource strain: Not on file  . Food insecurity:    Worry: Not on file    Inability: Not on file  . Transportation needs:    Medical: Not on file    Non-medical: Not on file  Tobacco Use  . Smoking status: Never  Smoker  . Smokeless tobacco: Current User    Types: Snuff  Substance and Sexual Activity  . Alcohol use: No    Alcohol/week: 0.0 standard drinks  . Drug use: No  . Sexual activity: Not Currently  Lifestyle  . Physical activity:    Days per week: Not on file    Minutes per session: Not on file  . Stress: Not on file  Relationships  . Social connections:    Talks on phone: Not on file    Gets together: Not on file    Attends religious service: Not on file    Active member of club or organization: Not on file    Attends meetings of clubs or organizations: Not on file    Relationship status: Not on file  . Intimate partner violence:    Fear of current or ex partner: Not on file    Emotionally abused: Not on file    Physically abused: Not on file    Forced sexual activity: Not on file  Other Topics Concern  . Not on file  Social History Narrative   Never smoked cigs regularly but stopped all exposure 62s   Lives with wife    Family History  Problem Relation Age of Onset  . Asthma Mother   . Emphysema Father   . Colon cancer Neg Hx    Social history is normal and that he is married has 3 children 4 grandchildren. He does try to exercise and remains active. There is no tobacco or alcohol use.  ROS General: Negative; No fevers,  chills, or night sweats;  HEENT: Bilateral hearing aids.,no sinus congestion, difficulty swallowing Pulmonary: Negative; No cough, wheezing, shortness of breath, hemoptysis Cardiovascular: Negative; No chest pain, presyncope, syncope, palpitations GI: As if her recent diarrhea and diverticular disease. GU: Negative; No dysuria, hematuria, or difficulty voiding Musculoskeletal: Negative; no myalgias, joint pain, or weakness Hematologic/Oncology: Merkel cell carcinoma Endocrine: Negative; no heat/cold intolerance; no diabetes Neuro: Negative; no changes in balance, headaches Skin: Negative; No rashes or skin lesions Psychiatric: Negative; No behavioral problems, depression Sleep: Negative; No snoring, daytime sleepiness, hypersomnolence, bruxism, restless legs, hypnogognic hallucinations, no cataplexy Other comprehensive 14 point system review is negative.  PE BP (!) 174/80   Pulse 68   Ht _0  (1.778 m)   Wt 182 lb 9.6 oz (82.8 kg)   BMI 26.20 kg/m    Repeat blood pressure by me was markedly improved at 130/80  Wt Readings from Last 3 Encounters:  03/20/18 182 lb 9.6 oz (82.8 kg)  01/01/18 184 lb (83.5 kg)  09/18/17 180 lb 6.4 oz (81.8 kg)   General: Alert, oriented, no distress.  Skin: normal turgor, no rashes, warm and dry HEENT: Normocephalic, atraumatic. Pupils equal round and reactive to light; sclera anicteric; extraocular muscles intact; bilateral hearing aids.  Nose without nasal septal hypertrophy.  Mouth/Parynx benign; Mallinpatti scale 2 Scar from his Merkel cell carcinoma resection on his right face Neck: No JVD, no carotid bruits; normal carotid upstroke Lungs: clear to ausculatation and percussion; no wheezing or rales Chest wall: without tenderness to palpitation Heart: PMI not displaced, RRR, s1 s2 normal, 1/6 systolic murmur, no diastolic murmur, no rubs, gallops, thrills, or heaves Abdomen: soft, nontender; no hepatosplenomehaly, BS+; abdominal aorta  nontender and not dilated by palpation. Back: no CVA tenderness Pulses 2+ Musculoskeletal: full range of motion, normal strength, no joint deformities Extremities: no clubbing cyanosis or edema, Homan's sign negative  Neurologic: grossly nonfocal; Cranial nerves grossly  wnl Psychologic: Normal mood and affect   ECG (independently read by me): Normal sinus rhythm at 68 bpm, left bundle branch block with repolarization changes.  First-degree AV block with a PR interval 238 ms.  June 2019 ECG (independently read by me): Normal sinus rhythm at 63 bpm.  First degree AV block.  June 19, 2016 ECG (independently read by me): Normal sinus rhythm at 64 bpm.  First-degree AV block with a PR interval at 24 ms.  Left axis deviation.  October 2017 ECG (independently read by me): Normal sinus rhythm with first-degree AV block.  Nonspecific ST changes.  Normal intervals.  March 2017 ECG (independently read by me): Sinus rhythm with first-degree AV block with a pulse of 68 bpm.  PR interval 212 ms.  May 2016 ECG (independently read by me): Normal sinus rhythm at 72 bpm.  Incomplete left bundle-branch block.  T-wave abnormality in aVL.  Normal intervals.  November 2015 ECG (independently read by me): Sinus rhythm with first-degree AV block with a heart rate at 80 bpm.  PR interval 228 ms.  T-wave changes in leads 1 and aVL.  Poor anterior R-wave progression.  Prior November 2014 ECG: Sinus rhythm with first-degree AV block. Poor progression anteroseptally. ST-T changes laterally, unchanged  LABS: Blood work done by Nathan Howell at [facility] on 04/15/2015: BUN 7, creatinine 0.73.  Normal LFTs.  Potassium 4.6.  Glucose 104.  Hemoglobin 13.7, hematocrit 40.7.  White blood count 6.2.  Urinalysis negative.  Hemoglobin A1c 6.3.  Stage III 0.6.  PSA 0.4.  I personally reviewed the blood work from February, 2018 as noted above in the history of present illness.  BMP Latest Ref Rng &  Units 02/18/2015 02/17/2015 02/16/2015  Glucose 65 - 99 mg/dL 99 97 126(H)  BUN 6 - 20 mg/dL 5(L) 6 9  Creatinine 0.61 - 1.24 mg/dL 0.63 0.66 0.63  Sodium 135 - 145 mmol/L 129(L) 129(L) 124(L)  Potassium 3.5 - 5.1 mmol/L 3.7 3.5 3.5  Chloride 101 - 111 mmol/L 101 96(L) 90(L)  CO2 22 - 32 mmol/L _0 Calcium 8.9 - 10.3 mg/dL 8.2(L) 8.5(L) 8.8(L)   Hepatic Function Latest Ref Rng & Units 02/16/2015  Total Protein 6.5 - 8.1 g/dL 7.2  Albumin 3.5 - 5.0 g/dL 4.0  AST 15 - 41 U/L 15  ALT 17 - 63 U/L 12(L)  Alk Phosphatase 38 - 126 U/L 44  Total Bilirubin 0.3 - 1.2 mg/dL 0.8   CBC Latest Ref Rng & Units 02/17/2015 02/16/2015 01/25/2013  WBC 4.0 - 10.5 K/uL 4.6 7.8 7.4  Hemoglobin 13.0 - 17.0 g/dL 12.4(L) 13.4 13.2  Hematocrit 39.0 - 52.0 % 34.0(L) 37.7(L) 37.0(L)  Platelets 150 - 400 K/uL 236 282 243   Lab Results  Component Value Date   MCV 87.6 02/17/2015   MCV 88.5 02/16/2015   MCV 89.2 01/25/2013    Lab Results  Component Value Date   TSH 3.274 02/17/2015    Lab Results  Component Value Date   HGBA1C 6.2 (H) 02/17/2015    RADIOLOGY: No results found.  IMPRESSION:  1. CAD in native artery   2. Hx of CABG   3. Essential hypertension   4. Hyperlipidemia with target LDL less than 70   5. Type 2 diabetes mellitus with complication, with long-term current use of insulin ([place])   6. First degree heart block     ASSESSMENT AND PLAN: Mr. Huckleberry Martinson is an 33 -year-old white male who  is status post CABG surgery x3 in November 2005. His  stress test  in 2012 revealed mild inferior thinning felt to be due to diaphragmatic attenuation without ischemia.  His last stress study from February 20, 2017 continued to be low risk and showed normal perfusion with normal left ventricular regional and global systolic function.  EF was 59%.  He may stable with reference to his CAD and is without recurrent anginal symptomatology.  Blood pressure today was elevated but this was taken  by the nurse as soon as he walked into the room.  When I took his blood pressure it was significantly improved at 130/80 on his regimen consisting of metoprolol tartrate 25 mg twice a day, losartan 100 mg daily in addition to amlodipine 5 mg.  ECG reveals stable first-degree AV block.  He continues to be on rosuvastatin 20 mg.  His last lipid study in June 2019 showed a total cholesterol 112, HDL 39, LDL 57, and triglycerides 80.  He is tolerating rosuvastatin.  He is diabetic on glipizide in addition to [employer].  His weight is stable with a BMI of 26.2.  I reviewed his recent med [place] ER evaluation following his fall.  I reviewed his [facility] from [facility] in follow-up of his rectal cell carcinoma resection.  Will be seeing Dr. Reynaldo Minium will be checking his laboratory.  As long as he remains stable I will see him in 1 year for reevaluation.  Time spent: 25 minutes Troy Sine, MD, [facility]  03/21/2018 11:32 PM

## 2018-03-21 ENCOUNTER — Encounter: Payer: Self-pay | Admitting: Cardiovascular Disease

## 2018-05-08 DIAGNOSIS — J449 Chronic obstructive pulmonary disease, unspecified: Secondary | ICD-10-CM | POA: Diagnosis not present

## 2018-05-08 DIAGNOSIS — R05 Cough: Secondary | ICD-10-CM | POA: Diagnosis not present

## 2018-05-08 DIAGNOSIS — J09X2 Influenza due to identified novel influenza A virus with other respiratory manifestations: Secondary | ICD-10-CM | POA: Diagnosis not present

## 2018-05-08 DIAGNOSIS — Z6826 Body mass index (BMI) 26.0-26.9, adult: Secondary | ICD-10-CM | POA: Diagnosis not present

## 2018-06-11 DIAGNOSIS — H524 Presbyopia: Secondary | ICD-10-CM | POA: Diagnosis not present

## 2018-06-11 DIAGNOSIS — E119 Type 2 diabetes mellitus without complications: Secondary | ICD-10-CM | POA: Diagnosis not present

## 2018-06-11 DIAGNOSIS — Z0100 Encounter for examination of eyes and vision without abnormal findings: Secondary | ICD-10-CM | POA: Diagnosis not present

## 2018-06-17 DIAGNOSIS — E7849 Other hyperlipidemia: Secondary | ICD-10-CM | POA: Diagnosis not present

## 2018-06-17 DIAGNOSIS — E871 Hypo-osmolality and hyponatremia: Secondary | ICD-10-CM | POA: Diagnosis not present

## 2018-06-17 DIAGNOSIS — E1169 Type 2 diabetes mellitus with other specified complication: Secondary | ICD-10-CM | POA: Diagnosis not present

## 2018-06-17 DIAGNOSIS — E663 Overweight: Secondary | ICD-10-CM | POA: Diagnosis not present

## 2018-06-17 DIAGNOSIS — J449 Chronic obstructive pulmonary disease, unspecified: Secondary | ICD-10-CM | POA: Diagnosis not present

## 2018-06-17 DIAGNOSIS — I10 Essential (primary) hypertension: Secondary | ICD-10-CM | POA: Diagnosis not present

## 2018-06-17 DIAGNOSIS — C4A30 Merkel cell carcinoma of unspecified part of face: Secondary | ICD-10-CM | POA: Diagnosis not present

## 2018-07-18 DIAGNOSIS — E1169 Type 2 diabetes mellitus with other specified complication: Secondary | ICD-10-CM | POA: Diagnosis not present

## 2018-07-18 DIAGNOSIS — R197 Diarrhea, unspecified: Secondary | ICD-10-CM | POA: Diagnosis not present

## 2018-07-18 DIAGNOSIS — I10 Essential (primary) hypertension: Secondary | ICD-10-CM | POA: Diagnosis not present

## 2018-07-22 DIAGNOSIS — R197 Diarrhea, unspecified: Secondary | ICD-10-CM | POA: Diagnosis not present

## 2018-08-07 DIAGNOSIS — C4A9 Merkel cell carcinoma, unspecified: Secondary | ICD-10-CM | POA: Diagnosis not present

## 2018-08-07 DIAGNOSIS — L570 Actinic keratosis: Secondary | ICD-10-CM | POA: Diagnosis not present

## 2018-08-07 DIAGNOSIS — X32XXXD Exposure to sunlight, subsequent encounter: Secondary | ICD-10-CM | POA: Diagnosis not present

## 2018-10-01 DIAGNOSIS — X32XXXD Exposure to sunlight, subsequent encounter: Secondary | ICD-10-CM | POA: Diagnosis not present

## 2018-10-01 DIAGNOSIS — L570 Actinic keratosis: Secondary | ICD-10-CM | POA: Diagnosis not present

## 2018-10-04 ENCOUNTER — Other Ambulatory Visit: Payer: Self-pay | Admitting: Cardiovascular Disease

## 2018-10-07 NOTE — Telephone Encounter (Signed)
Rx(s) sent to pharmacy electronically.  

## 2018-10-22 DIAGNOSIS — H04123 Dry eye syndrome of bilateral lacrimal glands: Secondary | ICD-10-CM | POA: Diagnosis not present

## 2018-10-22 DIAGNOSIS — H2512 Age-related nuclear cataract, left eye: Secondary | ICD-10-CM | POA: Diagnosis not present

## 2018-11-13 ENCOUNTER — Encounter: Payer: Self-pay | Admitting: Nurse Practitioner

## 2018-11-13 ENCOUNTER — Ambulatory Visit: Payer: Medicare HMO | Admitting: Nurse Practitioner

## 2018-11-13 VITALS — BP 130/56 | HR 40 | Temp 80.2°F | Ht 70.0 in | Wt 180.0 lb

## 2018-11-13 DIAGNOSIS — R634 Abnormal weight loss: Secondary | ICD-10-CM

## 2018-11-13 DIAGNOSIS — R197 Diarrhea, unspecified: Secondary | ICD-10-CM

## 2018-11-13 MED ORDER — BUDESONIDE 3 MG PO CPEP: 9.0000 mg | ORAL_CAPSULE | Freq: Every day | ORAL | 0 refills | Status: DC

## 2018-11-13 NOTE — Progress Notes (Signed)
ASSESSMENT / PLAN:    84 yo male with chronic diarrhea and history of collagenous colitis in 2016. Never treated Howell to lack of insurance coverage of medications. Diarrhea worse over last few months, extensive stool studies by PCP were negative. Some meds can trigger flare of collagenous colitis. Though not on a PPI he does take a statin. No NSAIDs other than baby asa.  -Not sure he ever took the Budesonide, I think probably not. Will start Budesonide 9 mg day, hopefully insurance will cover it.  -Can continue pepto and imodium as needed for now.  -Daughter will call us in ~ 3-4 weeks with condition update.    HPI:    Chief Complaint:   diarrhea  Nathan Howell is an 84 yo male with a history of collagenous colitis diagnosed in 2016.  Patient is here with daughter who helps with history. Patient has no recollection but at the time of diagnosis we prescribed Uceris but based on phone conversations insurance wouldn't pay for it. We then recommended Budesonide but not sure what became of that, he never got it / started it. Since 2016 he has been living off imodium and pepto as needed. Has several loose stool daily most days of the week. Diarrhea progressive over the last few months, sometimes with more than 10 loose stools a day. Tried gluten free diet and also reduced Metformin dose without improvement.  With imodium and pepto he can reduce the frequency down to just a few loose stools a day.  Stools non-bloody, no associated abdominal pain but he has lost weight and feels weak. Saw PCP late April , stool studies negative.    Data Review  07/22/18 C-diff, stool culture, O+P, stool WBC, stool total fat - all negative.     Past Medical History:  Diagnosis Date  . Abdominal hernia   . Asthma    [place] - HFA 50% 03/16/09 > 50% 05/13/10  . Collagenous colitis   . COPD (chronic obstructive pulmonary disease) ([place]) dx'd ~ 2015  . Coronary artery disease   . Diverticulosis   .  GERD (gastroesophageal reflux disease)   . Glaucoma   . History of stress test 02/2011   Which showed mild inferior thinning felt to be diaphragmatic attenuation artifact without ichemia. Poststress EF was 65%.  [name] Hx of echocardiogram    EF>55% this showed moderate concentric LVH with normal systolic function. There was evidence for grade 1 diastolic relaxation abnormality with possible very small region of apical lateral hypokinesis.He did have moderate left atrial dilatation by volume assessment. There was mild mitral annular calcification with trace MR, trace TR, mild aortic valve sclerosis without stenosis    . Hyperlipidemia   . Hypertension   . Rhinitis medicamentosa    per remote [place] w/u  . Type II diabetes mellitus ([place])      Past Surgical History:  Procedure Laterality Date  . CARDIAC CATHETERIZATION  02/2004   Normal left ventricular function. Significant multivessel coronary artery disease corresponding to left main equivalent disease with diffuse tubular narrowing of 50% in the proximal left anterior descending, 905 in the ramus intermadius vessel and 80% in the most proximal circumflex veseel with 70% mid A-V groove circumflex stenosis.  [name] CATARACT EXTRACTION W/ INTRAOCULAR LENS IMPLANT Right 2003   . CORNEAL TRANSPLANT Right ~ 2004  . CORONARY ANGIOPLASTY    . CORONARY ARTERY BYPASS GRAFT  x3 By Dr Servando Snare with LIMA to the LAD, a vein to the intermediate, a vein to the distal circumflex. [name] was nondominant and not bypassed.  [name] GLAUCOMA VALVE INSERTION Right   . HAND SURGERY Right    "don't remember what was done"  . [place]   "not sure which side"  . KNEE ARTHROSCOPY Bilateral    Family History  Problem Relation Age of Onset  . Asthma Mother   . Emphysema Father   . Colon cancer Neg Hx    Social History   Tobacco Use  . Smoking status: Never Smoker  . Smokeless tobacco: Current User    Types: Snuff  Substance Use Topics  .  Alcohol use: No    Alcohol/week: 0.0 standard drinks  . Drug use: No   Current Outpatient Medications  Medication Sig Dispense Refill  . amLODipine (NORVASC) 5 MG tablet Take 5 mg by mouth daily.    [name] aspirin 81 MG tablet Take 1 tablet by mouth daily.    . dorzolamide (TRUSOPT) 2 % ophthalmic solution Place 1 drop into the right eye 2 (two) times daily.      [name] glipiZIDE (GLUCOTROL) 5 MG tablet Take 5 mg by mouth 2 (two) times daily before a meal.     . Insulin Glargine, 1 Unit Dial, (TOUJEO SOLOSTAR) 300 UNIT/ML SOPN Inject 14 Units into the skin every morning.    [name] losartan (COZAAR) 100 MG tablet Take 100 mg by mouth daily.    [name] METFORMIN HCL ER PO Take 1,000 mg by mouth 2 (two) times daily.     . metoprolol tartrate (LOPRESSOR) 25 MG tablet Take 1 tablet (25 mg total) by mouth 2 (two) times daily. 180 tablet 1  . rosuvastatin (CRESTOR) 20 MG tablet Take 1 tablet (20 mg total) by mouth daily. 90 tablet 1  . triamterene-hydrochlorothiazide (MAXZIDE-25) 37.5-25 MG tablet Take 1 tablet by mouth daily.    . budesonide (ENTOCORT EC) 3 MG 24 hr capsule Take 3 capsules (9 mg total) by mouth daily. 90 capsule 0   No current facility-administered medications for this visit.    No Known Allergies   Review of Systems: All systems reviewed and negative except where noted in HPI.   Creatinine clearance cannot be calculated (Patient's most recent lab result is older than the maximum 21 days allowed.)   Physical Exam:    Wt Readings from Last 3 Encounters:  11/13/18 180 lb (81.6 kg)  03/20/18 182 lb 9.6 oz (82.8 kg)  01/01/18 184 lb (83.5 kg)    BP (!) 130/56   Pulse (!) 40   Temp (!) 80.2 F (26.8 C) (Oral)   Ht 5\' 10"  (1.778 m)   Wt 180 lb (81.6 kg)   BMI 25.83 kg/m  Constitutional:  Pleasant male in no acute distress. Psychiatric: Normal mood and affect. Behavior is normal. EENT: Pupils normal.  Hard of hearing. Conjunctivae pale. No scleral icterus. Neck supple.   Cardiovascular: Normal rate, regular rhythm. No edema Pulmonary/chest: Effort normal and breath sounds normal. No wheezing, rales or rhonchi. Abdominal: Soft, nondistended, nontender. Bowel sounds active throughout. There are no masses palpable. No hepatomegaly. Neurological: Alert and oriented to person place and time. Skin: Skin is warm and dry. No rashes noted.  Tye Savoy, NP  11/13/2018, 4:09 PM  Cc: Burnard Bunting, MD

## 2018-11-13 NOTE — Patient Instructions (Signed)
If you are age 82 or older, your body mass index should be between 23-30. Your Body mass index is 25.83 kg/m. If this is out of the aforementioned range listed, please consider follow up with your Primary Care Provider.  If you are age 60 or younger, your body mass index should be between 19-25. Your Body mass index is 25.83 kg/m. If this is out of the aformentioned range listed, please consider follow up with your Primary Care Provider.   We have sent the following medications to your pharmacy for you to pick up at your convenience: Budesonide  Talk to pharmacist about nutritional drink for diabetics.  Call with an update in four weeks, or sooner if not improved.  Thank you for choosing me and [facility].   Tye Savoy, NP

## 2018-11-14 NOTE — Progress Notes (Signed)
Assessment and plan reviewed 

## 2018-12-02 DIAGNOSIS — E1169 Type 2 diabetes mellitus with other specified complication: Secondary | ICD-10-CM | POA: Diagnosis not present

## 2018-12-02 DIAGNOSIS — Z125 Encounter for screening for malignant neoplasm of prostate: Secondary | ICD-10-CM | POA: Diagnosis not present

## 2018-12-02 DIAGNOSIS — E7849 Other hyperlipidemia: Secondary | ICD-10-CM | POA: Diagnosis not present

## 2018-12-10 ENCOUNTER — Telehealth: Payer: Self-pay | Admitting: Internal Medicine

## 2018-12-10 MED ORDER — BUDESONIDE 3 MG PO CPEP: 9.0000 mg | ORAL_CAPSULE | Freq: Every day | ORAL | 3 refills | Status: DC

## 2018-12-10 NOTE — Telephone Encounter (Signed)
That's fine, thanks w

## 2018-12-10 NOTE — Telephone Encounter (Signed)
Budesonide sent to pharmacy

## 2018-12-10 NOTE — Telephone Encounter (Signed)
Okay to refill.  Get him follow-up with follow-up with Nevin Bloodgood in about 4 weeks

## 2018-12-10 NOTE — Telephone Encounter (Signed)
Sorry forgot to include that he actually already has an appointment with you scheduled for 10/8 if that's still ok.

## 2018-12-10 NOTE — Telephone Encounter (Signed)
When patient saw Nathan Howell last month he had not yet started the recommended Budesonide.  Evidently he is now taking it and it is helping.  Ok to refill?

## 2018-12-11 DIAGNOSIS — Z0000 Encounter for general adult medical examination without abnormal findings: Secondary | ICD-10-CM | POA: Diagnosis not present

## 2018-12-11 DIAGNOSIS — E1169 Type 2 diabetes mellitus with other specified complication: Secondary | ICD-10-CM | POA: Diagnosis not present

## 2018-12-11 DIAGNOSIS — R82998 Other abnormal findings in urine: Secondary | ICD-10-CM | POA: Diagnosis not present

## 2018-12-11 DIAGNOSIS — J449 Chronic obstructive pulmonary disease, unspecified: Secondary | ICD-10-CM | POA: Diagnosis not present

## 2018-12-11 DIAGNOSIS — E871 Hypo-osmolality and hyponatremia: Secondary | ICD-10-CM | POA: Diagnosis not present

## 2018-12-11 DIAGNOSIS — Z23 Encounter for immunization: Secondary | ICD-10-CM | POA: Diagnosis not present

## 2018-12-11 DIAGNOSIS — K529 Noninfective gastroenteritis and colitis, unspecified: Secondary | ICD-10-CM | POA: Diagnosis not present

## 2018-12-11 DIAGNOSIS — E785 Hyperlipidemia, unspecified: Secondary | ICD-10-CM | POA: Diagnosis not present

## 2018-12-11 DIAGNOSIS — I10 Essential (primary) hypertension: Secondary | ICD-10-CM | POA: Diagnosis not present

## 2018-12-11 DIAGNOSIS — C4A30 Merkel cell carcinoma of unspecified part of face: Secondary | ICD-10-CM | POA: Diagnosis not present

## 2018-12-17 DIAGNOSIS — Z1212 Encounter for screening for malignant neoplasm of rectum: Secondary | ICD-10-CM | POA: Diagnosis not present

## 2018-12-26 ENCOUNTER — Ambulatory Visit (INDEPENDENT_AMBULATORY_CARE_PROVIDER_SITE_OTHER): Payer: Medicare HMO | Admitting: Otolaryngology

## 2019-01-09 ENCOUNTER — Ambulatory Visit: Payer: Medicare HMO | Admitting: Internal Medicine

## 2019-01-20 DIAGNOSIS — H401113 Primary open-angle glaucoma, right eye, severe stage: Secondary | ICD-10-CM | POA: Diagnosis not present

## 2019-01-21 DIAGNOSIS — L570 Actinic keratosis: Secondary | ICD-10-CM | POA: Diagnosis not present

## 2019-01-21 DIAGNOSIS — X32XXXD Exposure to sunlight, subsequent encounter: Secondary | ICD-10-CM | POA: Diagnosis not present

## 2019-01-30 ENCOUNTER — Encounter (HOSPITAL_COMMUNITY): Payer: Self-pay | Admitting: Emergency Medicine

## 2019-01-30 ENCOUNTER — Inpatient Hospital Stay (HOSPITAL_COMMUNITY): Payer: Medicare HMO

## 2019-01-30 ENCOUNTER — Encounter (HOSPITAL_COMMUNITY)
Admission: EM | Disposition: A | Discharge status: Home / Self Care | Payer: Self-pay | Source: Home / Self Care | Attending: Internal Medicine

## 2019-01-30 ENCOUNTER — Inpatient Hospital Stay (HOSPITAL_COMMUNITY)
Admission: EM | Admit: 2019-01-30 | Discharge: 2019-02-01 | DRG: 229 | Disposition: A | Discharge status: Home / Self Care | Payer: Medicare HMO | Attending: Internal Medicine | Admitting: Internal Medicine

## 2019-01-30 DIAGNOSIS — E785 Hyperlipidemia, unspecified: Secondary | ICD-10-CM | POA: Diagnosis not present

## 2019-01-30 DIAGNOSIS — E440 Moderate protein-calorie malnutrition: Secondary | ICD-10-CM | POA: Diagnosis not present

## 2019-01-30 DIAGNOSIS — R231 Pallor: Secondary | ICD-10-CM | POA: Diagnosis not present

## 2019-01-30 DIAGNOSIS — Z825 Family history of asthma and other chronic lower respiratory diseases: Secondary | ICD-10-CM | POA: Diagnosis not present

## 2019-01-30 DIAGNOSIS — Z7982 Long term (current) use of aspirin: Secondary | ICD-10-CM | POA: Diagnosis not present

## 2019-01-30 DIAGNOSIS — I442 Atrioventricular block, complete: Secondary | ICD-10-CM | POA: Diagnosis not present

## 2019-01-30 DIAGNOSIS — H409 Unspecified glaucoma: Secondary | ICD-10-CM | POA: Diagnosis present

## 2019-01-30 DIAGNOSIS — Z951 Presence of aortocoronary bypass graft: Secondary | ICD-10-CM | POA: Diagnosis not present

## 2019-01-30 DIAGNOSIS — I4510 Unspecified right bundle-branch block: Secondary | ICD-10-CM | POA: Diagnosis not present

## 2019-01-30 DIAGNOSIS — R55 Syncope and collapse: Secondary | ICD-10-CM

## 2019-01-30 DIAGNOSIS — Z7951 Long term (current) use of inhaled steroids: Secondary | ICD-10-CM

## 2019-01-30 DIAGNOSIS — R000 Tachycardia, unspecified: Secondary | ICD-10-CM | POA: Diagnosis not present

## 2019-01-30 DIAGNOSIS — Z950 Presence of cardiac pacemaker: Secondary | ICD-10-CM | POA: Diagnosis not present

## 2019-01-30 DIAGNOSIS — I10 Essential (primary) hypertension: Secondary | ICD-10-CM | POA: Diagnosis not present

## 2019-01-30 DIAGNOSIS — Z006 Encounter for examination for normal comparison and control in clinical research program: Secondary | ICD-10-CM | POA: Diagnosis not present

## 2019-01-30 DIAGNOSIS — R001 Bradycardia, unspecified: Secondary | ICD-10-CM | POA: Diagnosis not present

## 2019-01-30 DIAGNOSIS — Z6825 Body mass index (BMI) 25.0-25.9, adult: Secondary | ICD-10-CM | POA: Diagnosis not present

## 2019-01-30 DIAGNOSIS — E119 Type 2 diabetes mellitus without complications: Secondary | ICD-10-CM | POA: Diagnosis present

## 2019-01-30 DIAGNOSIS — Z9861 Coronary angioplasty status: Secondary | ICD-10-CM | POA: Diagnosis not present

## 2019-01-30 DIAGNOSIS — Z794 Long term (current) use of insulin: Secondary | ICD-10-CM | POA: Diagnosis not present

## 2019-01-30 DIAGNOSIS — I959 Hypotension, unspecified: Secondary | ICD-10-CM | POA: Diagnosis not present

## 2019-01-30 DIAGNOSIS — I2510 Atherosclerotic heart disease of native coronary artery without angina pectoris: Secondary | ICD-10-CM | POA: Diagnosis present

## 2019-01-30 DIAGNOSIS — J449 Chronic obstructive pulmonary disease, unspecified: Secondary | ICD-10-CM | POA: Diagnosis present

## 2019-01-30 DIAGNOSIS — Z20828 Contact with and (suspected) exposure to other viral communicable diseases: Secondary | ICD-10-CM | POA: Diagnosis present

## 2019-01-30 DIAGNOSIS — Z947 Corneal transplant status: Secondary | ICD-10-CM | POA: Diagnosis not present

## 2019-01-30 DIAGNOSIS — Z79899 Other long term (current) drug therapy: Secondary | ICD-10-CM | POA: Diagnosis not present

## 2019-01-30 DIAGNOSIS — I358 Other nonrheumatic aortic valve disorders: Secondary | ICD-10-CM | POA: Diagnosis present

## 2019-01-30 HISTORY — PX: TEMPORARY PACEMAKER: CATH118268

## 2019-01-30 LAB — CBC WITH DIFFERENTIAL/PLATELET
Abs Immature Granulocytes: 0.03 10*3/uL (ref 0.00–0.07)
Basophils Absolute: 0 10*3/uL (ref 0.0–0.1)
Basophils Relative: 1 %
Eosinophils Absolute: 0.1 10*3/uL (ref 0.0–0.5)
Eosinophils Relative: 2 %
HCT: 35.7 % — ABNORMAL LOW (ref 39.0–52.0)
Hemoglobin: 12.4 g/dL — ABNORMAL LOW (ref 13.0–17.0)
Immature Granulocytes: 1 %
Lymphocytes Relative: 19 %
Lymphs Abs: 1.3 10*3/uL (ref 0.7–4.0)
MCH: 32.2 pg (ref 26.0–34.0)
MCHC: 34.7 g/dL (ref 30.0–36.0)
MCV: 92.7 fL (ref 80.0–100.0)
Monocytes Absolute: 0.8 10*3/uL (ref 0.1–1.0)
Monocytes Relative: 12 %
Neutro Abs: 4.4 10*3/uL (ref 1.7–7.7)
Neutrophils Relative %: 65 %
Platelets: 198 10*3/uL (ref 150–400)
RBC: 3.85 MIL/uL — ABNORMAL LOW (ref 4.22–5.81)
RDW: 12.4 % (ref 11.5–15.5)
WBC: 6.6 10*3/uL (ref 4.0–10.5)
nRBC: 0 % (ref 0.0–0.2)

## 2019-01-30 LAB — ECHOCARDIOGRAM COMPLETE

## 2019-01-30 LAB — TSH: TSH: 2.958 u[IU]/mL (ref 0.350–4.500)

## 2019-01-30 LAB — TROPONIN I (HIGH SENSITIVITY)
Troponin I (High Sensitivity): 174 ng/L (ref <18)
Troponin I (High Sensitivity): 249 ng/L (ref <18)

## 2019-01-30 LAB — COMPREHENSIVE METABOLIC PANEL
ALT: 22 U/L (ref 0–44)
AST: 18 U/L (ref 15–41)
Albumin: 3.8 g/dL (ref 3.5–5.0)
Alkaline Phosphatase: 29 U/L — ABNORMAL LOW (ref 38–126)
Anion gap: 10 (ref 5–15)
BUN: 21 mg/dL (ref 8–23)
CO2: 23 mmol/L (ref 22–32)
Calcium: 9 mg/dL (ref 8.9–10.3)
Chloride: 93 mmol/L — ABNORMAL LOW (ref 98–111)
Creatinine, Ser: 1.14 mg/dL (ref 0.61–1.24)
GFR calc Af Amer: >60 mL/min (ref >60)
GFR calc non Af Amer: 60 mL/min — ABNORMAL LOW (ref >60)
Glucose, Bld: 95 mg/dL (ref 70–99)
Potassium: 4.5 mmol/L (ref 3.5–5.1)
Sodium: 126 mmol/L — ABNORMAL LOW (ref 135–145)
Total Bilirubin: 0.7 mg/dL (ref 0.3–1.2)
Total Protein: 6.5 g/dL (ref 6.5–8.1)

## 2019-01-30 LAB — LACTIC ACID, PLASMA
Lactic Acid, Venous: 1.2 mmol/L (ref 0.5–1.9)
Lactic Acid, Venous: 1.3 mmol/L (ref 0.5–1.9)

## 2019-01-30 LAB — GLUCOSE, CAPILLARY
Glucose-Capillary: 159 mg/dL — ABNORMAL HIGH (ref 70–99)
Glucose-Capillary: 178 mg/dL — ABNORMAL HIGH (ref 70–99)
Glucose-Capillary: 97 mg/dL (ref 70–99)

## 2019-01-30 LAB — MRSA PCR SCREENING: MRSA by PCR: NEGATIVE

## 2019-01-30 LAB — SARS CORONAVIRUS 2 BY RT PCR (HOSPITAL ORDER, PERFORMED IN ~~LOC~~ HOSPITAL LAB): SARS Coronavirus 2: NEGATIVE

## 2019-01-30 LAB — BRAIN NATRIURETIC PEPTIDE: B Natriuretic Peptide: 625.9 pg/mL — ABNORMAL HIGH (ref 0.0–100.0)

## 2019-01-30 SURGERY — TEMPORARY PACEMAKER
Anesthesia: LOCAL

## 2019-01-30 MED ORDER — BUDESONIDE 3 MG PO CPEP
9.0000 mg | ORAL_CAPSULE | Freq: Every day | ORAL | Status: DC
  Administered 2019-01-31 – 2019-02-01 (×2): 9 mg via ORAL
  Filled 2019-01-30 (×2): qty 3

## 2019-01-30 MED ORDER — DORZOLAMIDE HCL 2 % OP SOLN
1.0000 [drp] | Freq: Two times a day (BID) | OPHTHALMIC | Status: DC
  Administered 2019-01-30 – 2019-01-31 (×3): 1 [drp] via OPHTHALMIC
  Filled 2019-01-30: qty 10

## 2019-01-30 MED ORDER — SODIUM CHLORIDE 0.9 % IV SOLN: 250.0000 mL | INTRAVENOUS | Status: DC | PRN

## 2019-01-30 MED ORDER — SODIUM CHLORIDE 0.9% FLUSH: 3.0000 mL | INTRAVENOUS | Status: DC | PRN

## 2019-01-30 MED ORDER — ZOLPIDEM TARTRATE 5 MG PO TABS: 5.0000 mg | ORAL_TABLET | Freq: Every evening | ORAL | Status: DC | PRN

## 2019-01-30 MED ORDER — MIDAZOLAM HCL 2 MG/2ML IJ SOLN
INTRAMUSCULAR | Status: AC
  Filled 2019-01-30: qty 2

## 2019-01-30 MED ORDER — HEPARIN (PORCINE) IN NACL 1000-0.9 UT/500ML-% IV SOLN
INTRAVENOUS | Status: DC | PRN
  Administered 2019-01-30: 500 mL

## 2019-01-30 MED ORDER — MIDAZOLAM HCL 2 MG/2ML IJ SOLN
INTRAMUSCULAR | Status: AC
  Administered 2019-01-30: 15:00:00
  Filled 2019-01-30: qty 2

## 2019-01-30 MED ORDER — ROSUVASTATIN CALCIUM 20 MG PO TABS
20.0000 mg | ORAL_TABLET | Freq: Every day | ORAL | Status: DC
  Administered 2019-01-30 – 2019-01-31 (×2): 20 mg via ORAL
  Filled 2019-01-30 (×2): qty 1

## 2019-01-30 MED ORDER — SODIUM CHLORIDE 0.9 % IV SOLN
INTRAVENOUS | Status: AC | PRN
  Administered 2019-01-30: 10 mL/h via INTRAVENOUS

## 2019-01-30 MED ORDER — SODIUM CHLORIDE 0.9 % IV SOLN: INTRAVENOUS | Status: DC

## 2019-01-30 MED ORDER — ONDANSETRON HCL 4 MG/2ML IJ SOLN: 4.0000 mg | Freq: Four times a day (QID) | INTRAMUSCULAR | Status: DC | PRN

## 2019-01-30 MED ORDER — SODIUM CHLORIDE 0.9% FLUSH
3.0000 mL | Freq: Two times a day (BID) | INTRAVENOUS | Status: DC
  Administered 2019-01-30: 3 mL via INTRAVENOUS

## 2019-01-30 MED ORDER — LIDOCAINE HCL (PF) 1 % IJ SOLN
INTRAMUSCULAR | Status: DC | PRN
  Administered 2019-01-30: 16 mL via INTRADERMAL

## 2019-01-30 MED ORDER — ACETAMINOPHEN 325 MG PO TABS: 650.0000 mg | ORAL_TABLET | ORAL | Status: DC | PRN

## 2019-01-30 MED ORDER — ALPRAZOLAM 0.25 MG PO TABS: 0.2500 mg | ORAL_TABLET | Freq: Two times a day (BID) | ORAL | Status: DC | PRN

## 2019-01-30 MED ORDER — PNEUMOCOCCAL VAC POLYVALENT 25 MCG/0.5ML IJ INJ
0.5000 mL | INJECTION | INTRAMUSCULAR | Status: DC
  Filled 2019-01-30: qty 0.5

## 2019-01-30 MED ORDER — SODIUM CHLORIDE 0.9% FLUSH
3.0000 mL | Freq: Two times a day (BID) | INTRAVENOUS | Status: DC
  Administered 2019-01-30 – 2019-01-31 (×3): 3 mL via INTRAVENOUS

## 2019-01-30 MED ORDER — INSULIN GLARGINE (1 UNIT DIAL) 300 UNIT/ML ~~LOC~~ SOPN: 14.0000 [IU] | PEN_INJECTOR | Freq: Every morning | SUBCUTANEOUS | Status: DC

## 2019-01-30 MED ORDER — SODIUM CHLORIDE 0.9% FLUSH
3.0000 mL | INTRAVENOUS | Status: DC | PRN
  Administered 2019-01-30: 3 mL via INTRAVENOUS
  Filled 2019-01-30: qty 3

## 2019-01-30 MED ORDER — INFLUENZA VAC A&B SA ADJ QUAD 0.5 ML IM PRSY
0.5000 mL | PREFILLED_SYRINGE | INTRAMUSCULAR | Status: DC
  Filled 2019-01-30 (×2): qty 0.5

## 2019-01-30 MED ORDER — INSULIN DETEMIR 100 UNIT/ML ~~LOC~~ SOLN
7.0000 [IU] | Freq: Every day | SUBCUTANEOUS | Status: DC
  Administered 2019-02-01: 7 [IU] via SUBCUTANEOUS
  Filled 2019-01-30 (×2): qty 0.07

## 2019-01-30 MED ORDER — NITROGLYCERIN 0.4 MG SL SUBL: 0.4000 mg | SUBLINGUAL_TABLET | SUBLINGUAL | Status: DC | PRN

## 2019-01-30 MED ORDER — SODIUM CHLORIDE 0.9 % IV SOLN
INTRAVENOUS | Status: DC
  Administered 2019-01-30: 16:00:00 via INTRAVENOUS

## 2019-01-30 MED ORDER — LIDOCAINE HCL (PF) 1 % IJ SOLN
INTRAMUSCULAR | Status: AC
  Filled 2019-01-30: qty 30

## 2019-01-30 MED ORDER — ASPIRIN 81 MG PO TABS
81.0000 mg | ORAL_TABLET | Freq: Every day | ORAL | Status: DC
  Administered 2019-01-30: 81 mg via ORAL

## 2019-01-30 MED ORDER — ENOXAPARIN SODIUM 40 MG/0.4ML ~~LOC~~ SOLN
40.0000 mg | SUBCUTANEOUS | Status: DC
  Administered 2019-01-30: 40 mg via SUBCUTANEOUS
  Filled 2019-01-30: qty 0.4

## 2019-01-30 MED ORDER — ASPIRIN EC 81 MG PO TBEC
81.0000 mg | DELAYED_RELEASE_TABLET | Freq: Every day | ORAL | Status: DC
  Administered 2019-01-31 – 2019-02-01 (×2): 81 mg via ORAL
  Filled 2019-01-30 (×2): qty 1

## 2019-01-30 MED ORDER — HEPARIN (PORCINE) IN NACL 1000-0.9 UT/500ML-% IV SOLN
INTRAVENOUS | Status: AC
  Filled 2019-01-30: qty 500

## 2019-01-30 SURGICAL SUPPLY — 8 items
CABLE ADAPT PACING TEMP 12FT (ADAPTER) ×1 IMPLANT
CATH S G BIP PACING (CATHETERS) ×1 IMPLANT
HOVERMATT SINGLE USE (MISCELLANEOUS) ×1 IMPLANT
PROTECTION STATION PRESSURIZED (MISCELLANEOUS) ×2
SHEATH PINNACLE 6F 10CM (SHEATH) ×1 IMPLANT
SHEATH PROBE COVER 6X72 (BAG) ×1 IMPLANT
SLEEVE REPOSITIONING LENGTH 30 (MISCELLANEOUS) ×1 IMPLANT
STATION PROTECTION PRESSURIZED (MISCELLANEOUS) IMPLANT

## 2019-01-30 NOTE — Progress Notes (Signed)
Orthopedic Tech Progress Note Patient Details:  Nathan Howell [birth year] [id number] RN called requesting a knee immobilizer for patient. Ortho Devices Type of Ortho Device: Knee Immobilizer Ortho Device/Splint Location: LRE Ortho Device/Splint Interventions: Adjustment, Application, Ordered   Post Interventions Patient Tolerated: Well Instructions Provided: Care of device, Adjustment of device   Janit Pagan 01/30/2019, 4:01 PM

## 2019-01-30 NOTE — ED Notes (Signed)
Cardiology np at bedside

## 2019-01-30 NOTE — H&P (Signed)
Cardiology Admission History and Physical:   Patient ID: Nathan Howell; MRN: [id number]; DOB: 05/28/1934   Admission date: 01/30/2019  Primary Care Provider: Burnard Bunting, MD Primary Cardiologist: Shelva Majestic, MD 03/20/2018 Primary Electrophysiologist:  None   Chief Complaint: Heart block  Patient Profile:   Nathan Howell is a 84 y.o. male with a history of CABG times 06/2003 with LIMA-LAD, SVG-RI, SVG-CFX, DM2, HTN, HLD, MV 2012 ok, EF 65% with moderate concentric LVH, grade 1 DD, GERD  History of Present Illness:   Nathan Howell has been struggling with diarrhea which he says he has had for the last 5 years.  Recently, Dr. Henrene Pastor changed his medications and he has changed his diet as well.  The diarrhea has improved.  He has been struggling with weakness and fatigue which he felt was related to the diarrhea.  He does not check his blood pressure and heart rate regularly, the last time was approximately 2 months ago.  He has never known himself to have an abnormally low heart rate.  He has not had any chest pain.  He has had fatigue and some dyspnea on exertion, but that has not changed recently.  Today, he was with his wife and son taking his wife to see Dr. Claiborne Billings.  He had a syncopal episode in the car while sitting still.  He was not driving.  He had another syncopal episode in the lobby.  He was evaluated and his heart rate was low.  EMS was called.  He had external pacing pads placed, but they were not used.  His level of consciousness was good although he complained of feeling lightheaded and dizzy.  His heart rate is currently 24.  Initially, his ECG appears to be complete heart block.  At the current time, although his heart rate is still extremely low, his ECG appears to be Mobitz 2 with 3-1 block.  He feels lightheaded and dizzy but has not lost consciousness since he got to the emergency room.  He has never had symptoms like this before.  He has no previous history of  syncope.   Past Medical History:  Diagnosis Date  . Abdominal hernia   . Asthma    [place] - HFA 50% 03/16/09 > 50% 05/13/10  . Collagenous colitis   . COPD (chronic obstructive pulmonary disease) ([place]) 2015  . Coronary artery disease 2005  . Diverticulosis   . GERD (gastroesophageal reflux disease)   . Glaucoma   . History of stress test 02/2011   Which showed mild inferior thinning felt to be diaphragmatic attenuation artifact without ichemia. Poststress EF was 65%.  [name] Hx of echocardiogram    EF>55% this showed moderate concentric LVH with normal systolic function. There was evidence for grade 1 diastolic relaxation abnormality with possible very small region of apical lateral hypokinesis.He did have moderate left atrial dilatation by volume assessment. There was mild mitral annular calcification with trace MR, trace TR, mild aortic valve sclerosis without stenosis    . Hyperlipidemia   . Hypertension   . Rhinitis medicamentosa    per remote [place] w/u  . Type II diabetes mellitus ([place])     Past Surgical History:  Procedure Laterality Date  . CARDIAC CATHETERIZATION  02/2004   Normal left ventricular function. Significant multivessel coronary artery disease corresponding to left main equivalent disease with diffuse tubular narrowing of 50% in the proximal left anterior descending, 905 in the ramus intermadius vessel and 80% in the most proximal circumflex veseel  with 70% mid A-V groove circumflex stenosis.  [name] CATARACT EXTRACTION W/ INTRAOCULAR LENS IMPLANT Right 2003   . CORNEAL TRANSPLANT Right ~ 2004  . CORONARY ANGIOPLASTY    . CORONARY ARTERY BYPASS GRAFT     x3 By Dr Servando Snare with LIMA to the LAD, a vein to the intermediate, a vein to the distal circumflex. [name] was nondominant and not bypassed.  [name] GLAUCOMA VALVE INSERTION Right   . HAND SURGERY Right    "don't remember what was done"  . [place]   "not sure which side"  . KNEE ARTHROSCOPY Bilateral       Medications Prior to Admission: Prior to Admission medications   Medication Sig Start Date End Date Taking? Authorizing Provider  amLODipine (NORVASC) 5 MG tablet Take 5 mg by mouth daily.    [provider]  aspirin 81 MG tablet Take 1 tablet by mouth daily. 11/18/09   [provider]  budesonide (ENTOCORT EC) 3 MG 24 hr capsule Take 3 capsules (9 mg total) by mouth daily. 12/10/18   Irene Shipper, MD  dorzolamide (TRUSOPT) 2 % ophthalmic solution Place 1 drop into the right eye 2 (two) times daily.      [provider]  glipiZIDE (GLUCOTROL) 5 MG tablet Take 5 mg by mouth 2 (two) times daily before a meal.     [provider]  Insulin Glargine, 1 Unit Dial, (TOUJEO SOLOSTAR) 300 UNIT/ML SOPN Inject 14 Units into the skin every morning.    [provider]  losartan (COZAAR) 100 MG tablet Take 100 mg by mouth daily.    [provider]  METFORMIN HCL ER PO Take 1,000 mg by mouth 2 (two) times daily.     [provider]  metoprolol tartrate (LOPRESSOR) 25 MG tablet Take 1 tablet (25 mg total) by mouth 2 (two) times daily. 10/07/18   Troy Sine, MD  rosuvastatin (CRESTOR) 20 MG tablet Take 1 tablet (20 mg total) by mouth daily. 10/07/18   Troy Sine, MD  triamterene-hydrochlorothiazide (MAXZIDE-25) 37.5-25 MG tablet Take 1 tablet by mouth daily.    [provider]     Allergies:   No Known Allergies  Social History:   Social History   Socioeconomic History  . Marital status: Married    Spouse name: Not on file  . Number of children: 3  . Years of education: Not on file  . Highest education level: Not on file  Occupational History  . Occupation: Retired  Scientific laboratory technician  . Financial resource strain: Not on file  . Food insecurity    Worry: Not on file    Inability: Not on file  . Transportation needs    Medical: Not on file    Non-medical: Not on file  Tobacco Use  . Smoking status: Never Smoker  .  Smokeless tobacco: Current User    Types: Snuff  Substance and Sexual Activity  . Alcohol use: No    Alcohol/week: 0.0 standard drinks  . Drug use: No  . Sexual activity: Not Currently  Lifestyle  . Physical activity    Days per week: Not on file    Minutes per session: Not on file  . Stress: Not on file  Relationships  . Social Herbalist on phone: Not on file    Gets together: Not on file    Attends religious service: Not on file    Active member of club  or organization: Not on file    Attends meetings of clubs or organizations: Not on file    Relationship status: Not on file  . Intimate partner violence    Fear of current or ex partner: Not on file    Emotionally abused: Not on file    Physically abused: Not on file    Forced sexual activity: Not on file  Other Topics Concern  . Not on file  Social History Narrative   Never smoked cigs regularly but stopped all exposure 47s   Lives with wife    Family History:  The patient's family history includes Asthma in his mother; Emphysema in his father. There is no history of Colon cancer.   The patient He indicated that his mother is deceased. He indicated that his father is deceased. He indicated that his sister is alive. He indicated that his brother is deceased. He indicated that the status of his neg hx is unknown.  ROS:  Please see the history of present illness.  All other ROS reviewed and negative.     Physical Exam/Data:   Vitals:   01/30/19 1158 01/30/19 1203 01/30/19 1227 01/30/19 1230  BP: (S) (!) 182/50 (!) 84/65    Pulse:  93 (!) 25 (!) 44  Resp:  17 12 12   Temp: (!) 97.4 F (36.3 C)     TempSrc: Oral     SpO2:  (!) 83% 98% 100%   No intake or output data in the 24 hours ending 01/30/19 1317 There were no vitals filed for this visit. There is no height or weight on file to calculate BMI.  General:  Well nourished, well developed, male in no acute distress HEENT: normal Lymph: no adenopathy  Neck:  JVD mildly elevated Endocrine:  No thryomegaly Vascular: No carotid bruits; 4/4 extremity pulses 2+ bilaterally  Cardiac:  normal S1, S2; RRR; soft murmur, no rub or gallop  Lungs:  clear to auscultation bilaterally, no wheezing, rhonchi or rales  Abd: soft, nontender, no hepatomegaly  Ext: No edema Musculoskeletal:  No deformities, BUE and BLE strength normal and equal Skin: warm and dry  Neuro:  CNs 2-12 intact, no focal abnormalities noted Psych:  Normal affect    EKG:  The ECG was personally reviewed: Initial ECG complete heart block with heart rate 56 and PVCs.  QRS duration 132 ms. Repeat ECG is either complete heart block or Mobitz 2 with 3-1 conduction, heart rate 24, QRS duration 140 ms  Relevant CV Studies:  ECHO: 02/2011 Ejection fraction 65%. An echo Doppler study in November 2012 showed moderate concentric LVH with normal systolic function grade 1 diastolic dysfunction. He did have moderate LA dilatation, mild mitral annular calcification trace MR, trace TR, and aortic valve sclerosis  CATH: None since CABG  MYOVIEW: 02/20/2017  The left ventricular ejection fraction is normal (55-65%).  Nuclear stress EF: 59%.  There was no ST segment deviation noted during stress.  Defect 1: There is a small defect of mild severity present in the basal inferior location.  The study is normal.  This is a low risk study.   There is a mild fixed inferior wall abnormality that most likely represents diaphragmatic attenuation artifact. Low risk stress nuclear study with otherwise normal perfusion and normal left ventricular regional and global systolic function.   Laboratory Data:  ChemistryNo results for input(s): NA, K, CL, CO2, GLUCOSE, BUN, CREATININE, CALCIUM, GFRNONAA, GFRAA, ANIONGAP in the last 168 hours.  No results for input(s): PROT,  ALBUMIN, AST, ALT, ALKPHOS, BILITOT in the last 168 hours. Hematology Recent Labs  Lab 01/30/19 1204  WBC 6.6  RBC 3.85*   HGB 12.4*  HCT 35.7*  MCV 92.7  MCH 32.2  MCHC 34.7  RDW 12.4  PLT 198   Cardiac Enzymes High Sensitivity Troponin:   Recent Labs  Lab 01/30/19 1204  TROPONINIHS 174*      Radiology/Studies:  No results found.  Assessment and Plan:   1.  Complete heart block: -Dr. Debara Pickett was in to see the patient and discussed the situation with Dr. Curt Bears -A temporary wire is planned for today, he is n.p.o. and the patient, his wife, and son are aware. -The patient and his family understand that the beta-blocker has to wear off before decision can be made about a permanent pacemaker -However, they understand that a pacemaker may be needed -He is currently externally pacing, his blood pressure has stabilized, but he is very uncomfortable with this. -He was given a milligram of Versed, which has helped -Continue current therapy and await temporary wire -Discontinue metoprolol  2.  CAD: -His initial troponin is elevated, continue to cycle -His initial troponin was drawn prior to external pacing, later troponins may be elevated at least partly because of that -Although he has had problems with weakness recently, he thought that was because of the diarrhea -An echocardiogram is ordered -If his enzymes are significantly elevated or his EF is decreased, may need ischemic evaluation -However, his symptoms may have been due to a combination of bradycardia and ongoing GI issues  Otherwise, continue home medications Active Problems:   Complete heart block ([place])   For questions or updates, please contact [facility] Please consult [email or web address] for contact info under Cardiology/STEMI.    SignedRosaria Ferries, PA-C  01/30/2019 1:17 PM

## 2019-01-30 NOTE — Interval H&P Note (Signed)
History and Physical Interval Note:  01/30/2019 2:47 PM  [name]  has presented today for surgery, with the diagnosis of Complete Heart Block.  The various methods of treatment have been discussed with the patient and family. After consideration of risks, benefits and other options for treatment, the patient has consented to  Procedure(s): TEMPORARY PACEMAKER (N/A) as a surgical intervention.  The patient's history has been reviewed, patient examined, no change in status, stable for surgery.  I have reviewed the patient's chart and labs.  Questions were answered to the patient's satisfaction.     Lauree Chandler

## 2019-01-30 NOTE — Progress Notes (Signed)
  Echocardiogram 2D Echocardiogram has been performed.  Nathan Howell 01/30/2019, 6:22 PM

## 2019-01-30 NOTE — ED Triage Notes (Signed)
Pt here from MD office where he was waiting on his wife , not as a pt , pt had 2 syncopal episodes pt was found to have a HR in the 20's and 30 's  Pt received atropine by ems

## 2019-01-30 NOTE — Progress Notes (Signed)
  Echocardiogram 2D Echocardiogram has been attempted. Nurse stated patient leaving for Cath Lab. Will reattempt at later time.  Randa Lynn Venia Riveron 01/30/2019, 2:23 PM

## 2019-01-30 NOTE — ED Notes (Signed)
Pt concerned about his right hearing aid not being present upon arrival to ED. Medic with [facility] has checked truck and was unable to find it and also has called the building where his syncopal events occurred. Pt does have his left hearing aid in place.

## 2019-01-30 NOTE — ED Notes (Signed)
Resident Looking with [place] to evaluate contractility

## 2019-01-30 NOTE — ED Provider Notes (Signed)
[facility] EMERGENCY DEPARTMENT Provider Note   CSN: [id number] Arrival date & time: 01/30/19  1145     History   Chief Complaint Chief Complaint  Patient presents with  . Bradycardia    HPI Nathan Howell is a 84 y.o. male.      Illness Location:  Bradycardia, Syncope x2 Quality:  Lightheadedness Severity:  Severe Onset quality:  Sudden Duration:  1 hour Timing:  Constant Progression:  Waxing and waning Chronicity:  New Context:  At a doctor appointment with wife (he was not the patient) when he had 2 syncopal episodes and reported he was feeling "terrible" out of nowhere. Aleviated by laying down Relieved by:  Laying down Ineffective treatments:  None Associated symptoms: fatigue, loss of consciousness, nausea and shortness of breath   Associated symptoms: no abdominal pain, no chest pain, no cough, no ear pain, no fever, no rash, no sore throat and no vomiting     Past Medical History:  Diagnosis Date  . Abdominal hernia   . Asthma    [place] - HFA 50% 03/16/09 > 50% 05/13/10  . Collagenous colitis   . COPD (chronic obstructive pulmonary disease) ([place]) 2015  . Coronary artery disease 2005  . Diverticulosis   . GERD (gastroesophageal reflux disease)   . Glaucoma   . History of stress test 02/2011   Which showed mild inferior thinning felt to be diaphragmatic attenuation artifact without ichemia. Poststress EF was 65%.  [name] Hx of echocardiogram    EF>55% this showed moderate concentric LVH with normal systolic function. There was evidence for grade 1 diastolic relaxation abnormality with possible very small region of apical lateral hypokinesis.He did have moderate left atrial dilatation by volume assessment. There was mild mitral annular calcification with trace MR, trace TR, mild aortic valve sclerosis without stenosis    . Hyperlipidemia   . Hypertension   . Rhinitis medicamentosa    per remote Edie w/u  . Type II diabetes mellitus [facility])      Patient Active Problem List   Diagnosis Date Noted  . Complete heart block ([place]) 01/30/2019  . Hyponatremia 02/16/2015  . Generalized weakness 02/16/2015  . Headache 02/16/2015  . Asthma 02/16/2015  . COPD (chronic obstructive pulmonary disease) ([place]) 02/16/2015  . Glaucoma 02/16/2015  . Diabetes mellitus without complication ([place])   . CAD (coronary artery disease) 02/04/2013  . HTN (hypertension) 02/04/2013  . Hyperlipidemia with target LDL less than 70 02/04/2013  . Cough 05/06/2010  . DIABETES MELLITUS, TYPE II 11/03/2009  . WEIGHT LOSS-ABNORMAL 11/03/2009  . Diarrhea 11/03/2009  . ALLERGIC RHINITIS 03/01/2009  . Cough variant asthma 03/01/2009    Past Surgical History:  Procedure Laterality Date  . CARDIAC CATHETERIZATION  02/2004   Normal left ventricular function. Significant multivessel coronary artery disease corresponding to left main equivalent disease with diffuse tubular narrowing of 50% in the proximal left anterior descending, 905 in the ramus intermadius vessel and 80% in the most proximal circumflex veseel with 70% mid A-V groove circumflex stenosis.  [name] CATARACT EXTRACTION W/ INTRAOCULAR LENS IMPLANT Right 2003   . CORNEAL TRANSPLANT Right ~ 2004  . CORONARY ANGIOPLASTY    . CORONARY ARTERY BYPASS GRAFT     x3 By Dr Servando Snare with LIMA to the LAD, a vein to the intermediate, a vein to the distal circumflex. [name] was nondominant and not bypassed.  [name] GLAUCOMA VALVE INSERTION Right   . HAND SURGERY Right    "don't remember what was  done"  . [place]   "not sure which side"  . KNEE ARTHROSCOPY Bilateral         Home Medications    Prior to Admission medications   Medication Sig Start Date End Date Taking? Authorizing Provider  amLODipine (NORVASC) 5 MG tablet Take 5 mg by mouth daily.   Yes [provider]  aspirin 81 MG tablet Take 81 mg by mouth daily.  11/18/09  Yes [provider]  dorzolamide-timolol (COSOPT)  22.3-6.8 MG/ML ophthalmic solution Place 1 drop into the right eye 2 (two) times daily.   Yes [provider]  glipiZIDE (GLUCOTROL) 5 MG tablet Take 5 mg by mouth 2 (two) times daily before a meal.    Yes [provider]  Insulin Detemir (LEVEMIR FLEXTOUCH) 100 UNIT/ML Pen Inject 17 Units into the skin daily before breakfast.   Yes [provider]  losartan (COZAAR) 100 MG tablet Take 100 mg by mouth at bedtime.    Yes [provider]  metFORMIN (GLUCOPHAGE-XR) 500 MG 24 hr tablet Take 1,000 mg by mouth daily with breakfast.   Yes [provider]  metoprolol tartrate (LOPRESSOR) 25 MG tablet Take 25 mg by mouth 2 (two) times daily.   Yes [provider]  rosuvastatin (CRESTOR) 20 MG tablet Take 1 tablet (20 mg total) by mouth daily. Patient taking differently: Take 20 mg by mouth at bedtime.  10/07/18  Yes Troy Sine, MD  triamterene-hydrochlorothiazide (MAXZIDE-25) 37.5-25 MG tablet Take 1 tablet by mouth daily.   Yes [provider]  budesonide (ENTOCORT EC) 3 MG 24 hr capsule Take 3 capsules (9 mg total) by mouth daily. 12/10/18   Irene Shipper, MD    Family History Family History  Problem Relation Age of Onset  . Asthma Mother   . Emphysema Father   . Colon cancer Neg Hx     Social History Social History   Tobacco Use  . Smoking status: Never Smoker  . Smokeless tobacco: Current User    Types: Snuff  Substance Use Topics  . Alcohol use: No    Alcohol/week: 0.0 standard drinks  . Drug use: No     Allergies   Patient has no known allergies.   Review of Systems Review of Systems  Constitutional: Positive for fatigue. Negative for chills and fever.  HENT: Negative for ear pain and sore throat.   Eyes: Negative for pain and visual disturbance.  Respiratory: Positive for shortness of breath. Negative for cough.   Cardiovascular: Negative for chest pain and palpitations.  Gastrointestinal: Positive for nausea.  Negative for abdominal pain and vomiting.  Genitourinary: Negative for dysuria and hematuria.  Musculoskeletal: Negative for arthralgias and back pain.  Skin: Negative for color change and rash.  Neurological: Positive for loss of consciousness and light-headedness. Negative for seizures and syncope.  All other systems reviewed and are negative.    Physical Exam Updated Vital Signs BP (!) 142/126   Pulse (!) 59   Temp (!) 97.4 F (36.3 C) (Oral)   Resp 12   SpO2 100%   Physical Exam Vitals signs and nursing note reviewed.  Constitutional:      General: He is in acute distress.     Appearance: He is well-developed. He is ill-appearing.  HENT:     Head: Normocephalic and atraumatic.  Eyes:     Conjunctiva/sclera: Conjunctivae normal.  Neck:     Musculoskeletal: Neck supple.  Cardiovascular:  Rate and Rhythm: Bradycardia present. Rhythm irregular.     Heart sounds: No murmur.  Pulmonary:     Effort: Pulmonary effort is normal. No respiratory distress.     Breath sounds: Normal breath sounds.  Abdominal:     Palpations: Abdomen is soft.     Tenderness: There is no abdominal tenderness.  Skin:    General: Skin is warm and dry.     Capillary Refill: Capillary refill takes 2 to 3 seconds.  Neurological:     Mental Status: He is alert.      ED Treatments / Results  Labs (all labs ordered are listed, but only abnormal results are displayed) Labs Reviewed  CBC WITH DIFFERENTIAL/PLATELET - Abnormal; Notable for the following components:      Result Value   RBC 3.85 (*)    Hemoglobin 12.4 (*)    HCT 35.7 (*)    All other components within normal limits  COMPREHENSIVE METABOLIC PANEL - Abnormal; Notable for the following components:   Sodium 126 (*)    Chloride 93 (*)    Alkaline Phosphatase 29 (*)    GFR calc non Af Amer 60 (*)    All other components within normal limits  BRAIN NATRIURETIC PEPTIDE - Abnormal; Notable for the following components:   B  Natriuretic Peptide 625.9 (*)    All other components within normal limits  TROPONIN I (HIGH SENSITIVITY) - Abnormal; Notable for the following components:   Troponin I (High Sensitivity) 174 (*)    All other components within normal limits  TROPONIN I (HIGH SENSITIVITY) - Abnormal; Notable for the following components:   Troponin I (High Sensitivity) 249 (*)    All other components within normal limits  SARS CORONAVIRUS 2 BY RT PCR (HOSPITAL ORDER, [facility])  MRSA PCR SCREENING  LACTIC ACID, PLASMA  LACTIC ACID, PLASMA  TSH  GLUCOSE, CAPILLARY  BASIC METABOLIC PANEL  CBC    EKG EKG Interpretation  Date/Time:  Thursday January 30 2019 11:47:39 EDT Ventricular Rate:  56 PR Interval:    QRS Duration: 132 QT Interval:  570 QTC Calculation: 551 R Axis:   -72 Text Interpretation: complete heart block. Premature ventricular complexes Abnormal ECG Confirmed by Carmin Muskrat [phone]) on 01/30/2019 11:51:48 AM   Radiology No results found.  Procedures Procedures (including critical care time)  Medications Ordered in ED Medications  nitroGLYCERIN (NITROSTAT) SL tablet 0.4 mg ( Sublingual MAR Unhold 01/30/19 1542)  acetaminophen (TYLENOL) tablet 650 mg ( Oral MAR Unhold 01/30/19 1542)  ondansetron (ZOFRAN) injection 4 mg ( Intravenous MAR Unhold 01/30/19 1542)  zolpidem (AMBIEN) tablet 5 mg ( Oral MAR Unhold 01/30/19 1542)  enoxaparin (LOVENOX) injection 40 mg (40 mg Subcutaneous Given 01/30/19 1558)  sodium chloride flush (NS) 0.9 % injection 3 mL (3 mLs Intravenous Given 01/30/19 1557)  sodium chloride flush (NS) 0.9 % injection 3 mL ( Intravenous MAR Unhold 01/30/19 1542)  0.9 %  sodium chloride infusion ( Intravenous MAR Unhold 01/30/19 1542)  ALPRAZolam (XANAX) tablet 0.25 mg ( Oral MAR Unhold 01/30/19 1542)  rosuvastatin (CRESTOR) tablet 20 mg ( Oral MAR Unhold 01/30/19 1542)  dorzolamide (TRUSOPT) 2 % ophthalmic solution 1 drop (1 drop Right  Eye Not Given 01/30/19 1629)  budesonide (ENTOCORT EC) 24 hr capsule 9 mg (9 mg Oral Not Given 01/30/19 1629)  sodium chloride flush (NS) 0.9 % injection 3 mL (3 mLs Intravenous Given 01/30/19 1558)  sodium chloride flush (NS) 0.9 % injection 3 mL (  3 mLs Intravenous Given 01/30/19 1558)  sodium chloride flush (NS) 0.9 % injection 3 mL (has no administration in time range)  0.9 %  sodium chloride infusion (has no administration in time range)  0.9 %  sodium chloride infusion ( Intravenous New Bag/Given 01/30/19 1604)  aspirin EC tablet 81 mg (has no administration in time range)  insulin detemir (LEVEMIR) injection 7 Units (has no administration in time range)  midazolam (VERSED) 2 MG/2ML injection (  Given by Other 01/30/19 1518)  0.9 %  sodium chloride infusion (  Stopped 01/30/19 1552)  lidocaine (PF) (XYLOCAINE) 1 % injection (has no administration in time range)  Heparin (Porcine) in NaCl 1000-0.9 UT/500ML-% SOLN (has no administration in time range)     Initial Impression / Assessment and Plan / ED Course  I have reviewed the triage vital signs and the nursing notes.  Pertinent labs & imaging results that were available during my care of the patient were reviewed by me and considered in my medical decision making (see chart for details).        Patient is a 84 y/o M with H&P as above who presents to the ED with recurrent syncopal episodes found to be in profound bradycardia by EMS. BP with wide pulse pressure (180/50) in the ED. Mentating well. 3rd degree AV block on the monitor and EKG. Because patient is symptomatic, he was placed on TC pacing in the ED with a rate at 70bpm. Patient taken emergently to CCU for further workup, management, and likely pacemaker placement.   Patient seen in conjunction with Dr. Vanita Panda.  Final Clinical Impressions(s) / ED Diagnoses   Final diagnoses:  Complete heart block (HCC)  Bradycardia  Symptomatic bradycardia    ED Discharge Orders     None       Romona Curls, MD 01/30/19 1805    Carmin Muskrat, MD 01/31/19 [phone]

## 2019-01-30 NOTE — ED Notes (Signed)
Began pacing this patient at rate of 70 66MA. Pt given 1mg  versed iv by [place] RN verbal order from cards.

## 2019-01-31 ENCOUNTER — Ambulatory Visit: Payer: Medicare HMO | Admitting: Internal Medicine

## 2019-01-31 ENCOUNTER — Other Ambulatory Visit: Payer: Self-pay

## 2019-01-31 ENCOUNTER — Encounter (HOSPITAL_COMMUNITY)
Admission: EM | Disposition: A | Discharge status: Home / Self Care | Payer: Self-pay | Source: Home / Self Care | Attending: Internal Medicine

## 2019-01-31 ENCOUNTER — Encounter (HOSPITAL_COMMUNITY): Payer: Self-pay | Admitting: Cardiovascular Disease

## 2019-01-31 DIAGNOSIS — R001 Bradycardia, unspecified: Secondary | ICD-10-CM | POA: Diagnosis not present

## 2019-01-31 DIAGNOSIS — I442 Atrioventricular block, complete: Secondary | ICD-10-CM | POA: Diagnosis not present

## 2019-01-31 DIAGNOSIS — E440 Moderate protein-calorie malnutrition: Secondary | ICD-10-CM | POA: Insufficient documentation

## 2019-01-31 DIAGNOSIS — Z006 Encounter for examination for normal comparison and control in clinical research program: Secondary | ICD-10-CM

## 2019-01-31 HISTORY — PX: PACEMAKER LEADLESS INSERTION: EP1219

## 2019-01-31 LAB — CBC
HCT: 35.5 % — ABNORMAL LOW (ref 39.0–52.0)
Hemoglobin: 12.3 g/dL — ABNORMAL LOW (ref 13.0–17.0)
MCH: 32 pg (ref 26.0–34.0)
MCHC: 34.6 g/dL (ref 30.0–36.0)
MCV: 92.4 fL (ref 80.0–100.0)
Platelets: 206 10*3/uL (ref 150–400)
RBC: 3.84 MIL/uL — ABNORMAL LOW (ref 4.22–5.81)
RDW: 12.5 % (ref 11.5–15.5)
WBC: 6.2 10*3/uL (ref 4.0–10.5)
nRBC: 0 % (ref 0.0–0.2)

## 2019-01-31 LAB — BASIC METABOLIC PANEL
Anion gap: 9 (ref 5–15)
BUN: 17 mg/dL (ref 8–23)
CO2: 23 mmol/L (ref 22–32)
Calcium: 8.9 mg/dL (ref 8.9–10.3)
Chloride: 93 mmol/L — ABNORMAL LOW (ref 98–111)
Creatinine, Ser: 0.95 mg/dL (ref 0.61–1.24)
GFR calc Af Amer: >60 mL/min (ref >60)
GFR calc non Af Amer: >60 mL/min (ref >60)
Glucose, Bld: 122 mg/dL — ABNORMAL HIGH (ref 70–99)
Potassium: 4.1 mmol/L (ref 3.5–5.1)
Sodium: 125 mmol/L — ABNORMAL LOW (ref 135–145)

## 2019-01-31 LAB — GLUCOSE, CAPILLARY
Glucose-Capillary: 112 mg/dL — ABNORMAL HIGH (ref 70–99)
Glucose-Capillary: 97 mg/dL (ref 70–99)
Glucose-Capillary: 112 mg/dL — ABNORMAL HIGH (ref 70–99)

## 2019-01-31 LAB — SURGICAL PCR SCREEN
MRSA, PCR: NEGATIVE
Staphylococcus aureus: POSITIVE — AB

## 2019-01-31 SURGERY — PACEMAKER LEADLESS INSERTION

## 2019-01-31 MED ORDER — ACETAMINOPHEN 325 MG PO TABS
650.0000 mg | ORAL_TABLET | ORAL | Status: DC | PRN
  Filled 2019-01-31: qty 2

## 2019-01-31 MED ORDER — SODIUM CHLORIDE 0.9% FLUSH: 3.0000 mL | INTRAVENOUS | Status: DC | PRN

## 2019-01-31 MED ORDER — CEFAZOLIN SODIUM-DEXTROSE 2-4 GM/100ML-% IV SOLN
INTRAVENOUS | Status: AC
  Filled 2019-01-31: qty 100

## 2019-01-31 MED ORDER — SODIUM CHLORIDE 0.9 % IV SOLN
INTRAVENOUS | Status: AC
  Administered 2019-02-01: 01:00:00 via INTRAVENOUS

## 2019-01-31 MED ORDER — HEPARIN (PORCINE) IN NACL 1000-0.9 UT/500ML-% IV SOLN
INTRAVENOUS | Status: AC
  Filled 2019-01-31: qty 500

## 2019-01-31 MED ORDER — ENOXAPARIN SODIUM 40 MG/0.4ML ~~LOC~~ SOLN: 40.0000 mg | SUBCUTANEOUS | Status: DC

## 2019-01-31 MED ORDER — SODIUM CHLORIDE 0.9 % IV SOLN
80.0000 mg | INTRAVENOUS | Status: DC
  Filled 2019-01-31: qty 2

## 2019-01-31 MED ORDER — FENTANYL CITRATE (PF) 100 MCG/2ML IJ SOLN
INTRAMUSCULAR | Status: AC
  Filled 2019-01-31: qty 2

## 2019-01-31 MED ORDER — FENTANYL CITRATE (PF) 100 MCG/2ML IJ SOLN
INTRAMUSCULAR | Status: DC | PRN
  Administered 2019-01-31: 12.5 ug via INTRAVENOUS

## 2019-01-31 MED ORDER — MIDAZOLAM HCL 5 MG/5ML IJ SOLN
INTRAMUSCULAR | Status: AC
  Filled 2019-01-31: qty 5

## 2019-01-31 MED ORDER — ONDANSETRON HCL 4 MG/2ML IJ SOLN: 4.0000 mg | Freq: Four times a day (QID) | INTRAMUSCULAR | Status: DC | PRN

## 2019-01-31 MED ORDER — SODIUM CHLORIDE 0.9% FLUSH
3.0000 mL | Freq: Two times a day (BID) | INTRAVENOUS | Status: DC
  Administered 2019-01-31: 3 mL via INTRAVENOUS

## 2019-01-31 MED ORDER — BUPIVACAINE HCL (PF) 0.25 % IJ SOLN
INTRAMUSCULAR | Status: DC | PRN
  Administered 2019-01-31: 20 mL

## 2019-01-31 MED ORDER — HEPARIN SODIUM (PORCINE) 1000 UNIT/ML IJ SOLN
INTRAMUSCULAR | Status: AC
  Filled 2019-01-31: qty 1

## 2019-01-31 MED ORDER — METOPROLOL TARTRATE 25 MG PO TABS
25.0000 mg | ORAL_TABLET | Freq: Two times a day (BID) | ORAL | Status: DC
  Administered 2019-01-31 – 2019-02-01 (×2): 25 mg via ORAL
  Filled 2019-01-31 (×2): qty 1

## 2019-01-31 MED ORDER — AMLODIPINE BESYLATE 5 MG PO TABS
5.0000 mg | ORAL_TABLET | Freq: Every day | ORAL | Status: DC
  Administered 2019-02-01: 5 mg via ORAL
  Filled 2019-01-31: qty 1

## 2019-01-31 MED ORDER — MUPIROCIN 2 % EX OINT
1.0000 "application " | TOPICAL_OINTMENT | Freq: Two times a day (BID) | CUTANEOUS | Status: DC
  Administered 2019-01-31 – 2019-02-01 (×2): 1 via NASAL
  Filled 2019-01-31: qty 22

## 2019-01-31 MED ORDER — IOHEXOL 350 MG/ML SOLN
INTRAVENOUS | Status: DC | PRN
  Administered 2019-01-31: 10 mL

## 2019-01-31 MED ORDER — SODIUM CHLORIDE 0.9 % IV SOLN: INTRAVENOUS | Status: DC

## 2019-01-31 MED ORDER — CHLORHEXIDINE GLUCONATE CLOTH 2 % EX PADS
6.0000 | MEDICATED_PAD | Freq: Every day | CUTANEOUS | Status: DC
  Administered 2019-01-31: 6 via TOPICAL

## 2019-01-31 MED ORDER — BUPIVACAINE HCL (PF) 0.25 % IJ SOLN
INTRAMUSCULAR | Status: AC
  Filled 2019-01-31: qty 30

## 2019-01-31 MED ORDER — HEPARIN (PORCINE) IN NACL 1000-0.9 UT/500ML-% IV SOLN
INTRAVENOUS | Status: DC | PRN
  Administered 2019-01-31 (×3): 500 mL

## 2019-01-31 MED ORDER — CEFAZOLIN SODIUM-DEXTROSE 2-4 GM/100ML-% IV SOLN
2.0000 g | INTRAVENOUS | Status: AC
  Administered 2019-01-31: 2 g via INTRAVENOUS
  Filled 2019-01-31: qty 100

## 2019-01-31 MED ORDER — HEPARIN SODIUM (PORCINE) 1000 UNIT/ML IJ SOLN
INTRAMUSCULAR | Status: DC | PRN
  Administered 2019-01-31: 5000 [IU] via INTRAVENOUS

## 2019-01-31 MED ORDER — SODIUM CHLORIDE 0.9 % IV SOLN: 250.0000 mL | INTRAVENOUS | Status: DC | PRN

## 2019-01-31 MED ORDER — MIDAZOLAM HCL 5 MG/5ML IJ SOLN
INTRAMUSCULAR | Status: DC | PRN
  Administered 2019-01-31: 1 mg via INTRAVENOUS

## 2019-01-31 SURGICAL SUPPLY — 12 items
DEVICE CLOSURE PERCLS PRGLD 6F (VASCULAR PRODUCTS) IMPLANT
KIT DILATOR VASC 18G NDL (KITS) ×1 IMPLANT
MICRA AV TRANSCATH PACING SYS (Pacemaker) ×2 IMPLANT
MICRA INTRODUCER SHEATH (SHEATH) ×2
PAD PRO RADIOLUCENT 2001M-C (PAD) ×2 IMPLANT
PERCLOSE PROGLIDE 6F (VASCULAR PRODUCTS) ×6
SHEATH INTRODUCER MICRA (SHEATH) IMPLANT
SHEATH PINNACLE 8F 10CM (SHEATH) ×1 IMPLANT
SHEATH PROBE COVER 6X72 (BAG) ×1 IMPLANT
SYSTEM PACING TRNSCTH AV MICRA (Pacemaker) IMPLANT
TRAY PACEMAKER INSERTION (PACKS) ×2 IMPLANT
WIRE AMPLATZ SS-J .035X180CM (WIRE) ×1 IMPLANT

## 2019-01-31 NOTE — Progress Notes (Signed)
Initial Nutrition Assessment  DOCUMENTATION CODES:   Non-severe (moderate) malnutrition in context of chronic illness  INTERVENTION:   - Once diet advanced, Ensure Enlive po BID, each supplement provides 350 kcal and 20 grams of protein  NUTRITION DIAGNOSIS:   Moderate Malnutrition related to chronic illness (COPD) as evidenced by mild fat depletion, moderate fat depletion, mild muscle depletion, moderate muscle depletion.  GOAL:   Patient will meet greater than or equal to 90% of their needs  MONITOR:   Diet advancement, Supplement acceptance, Labs, Weight trends, I & O's  REASON FOR ASSESSMENT:   Malnutrition Screening Tool    ASSESSMENT:   84 year old male who presented to the ED on 10/29 after syncopal episodes. PMH of CAD s/p CABG x 3, HTN, HLD, T2DM, COPD. Pt found to have complete heart block.   Discussed pt with RN and during ICU rounds.  Noted plan for PPM later today. Pt is NPO for EP evaluation.  Spoke with pt and daughter at bedside. Pt reports having a good appetite and eating 2 meals daily.  Breakfast: cereal Dinner: cabbage, green beans, grilled chicken Snacks: fruit, candy nuts  Pt reports that he had colitis but has been on a new medication which has resolved his diarrhea over the last 2 months.  Pt endorses weight loss from 238 to 174 lbs over the course of 5 years. Reviewed weight history in chart. Weight stable over the last year.  Pt willing to consume an oral nutrition supplement once diet advanced.  Meal Completion: 50% x 1 meal  Medications reviewed and include: Levemir 7 units daily IVF: NS @ 10 ml/hr  Labs reviewed: sodium 125, chloride 93 CBG's: 97-178 x 24 hours  UOP: 3650 ml x 24 hours I/O's: -3.2 L since admit  NUTRITION - FOCUSED PHYSICAL EXAM:    Most Recent Value  Orbital Region  Mild depletion  Upper Arm Region  Moderate depletion  Thoracic and Lumbar Region  Mild depletion  Buccal Region  Mild depletion  Temple Region   Mild depletion  Clavicle Bone Region  Moderate depletion  Clavicle and Acromion Bone Region  Moderate depletion  Scapular Bone Region  Unable to assess  Dorsal Hand  Mild depletion  Patellar Region  Mild depletion  Anterior Thigh Region  Moderate depletion  Posterior Calf Region  Mild depletion  Edema (RD Assessment)  None  Hair  Reviewed  Eyes  Reviewed  Mouth  Reviewed  Skin  Reviewed  Nails  Reviewed       Diet Order:   Diet Order            Diet NPO time specified Except for: Sips with Meds  Diet effective midnight              EDUCATION NEEDS:   Education needs have been addressed  Skin:  Skin Assessment: Reviewed RN Assessment  Last BM:  01/29/19  Height:   Ht Readings from Last 1 Encounters:  01/30/19 5\' 10"  (1.778 m)    Weight:   Wt Readings from Last 1 Encounters:  01/30/19 81.6 kg    Ideal Body Weight:  75.5 kg  BMI:  Body mass index is 25.81 kg/m.  Estimated Nutritional Needs:   Kcal:  1800-2000  Protein:  90-110 grams  Fluid:  >/= 1.8 L    Gaynell Face, MS, RD, LDN Inpatient Clinical Dietitian Pager: [phone] Weekend/After Hours: [phone]

## 2019-01-31 NOTE — Progress Notes (Signed)
Progress Note  Patient Name: Nathan Howell Date of Encounter: 01/31/2019  Primary Cardiologist: Shelva Majestic, MD   Subjective  O/N events: N/A  Nathan Howell was examined and evaluated at bedside this AM. He was observed sleeping comfortably in bed but awakens easily. He mentions that his hearing aids are out of battery and he cannot hear well but otherwise feels much better than yesterday. Denies any chest pain, palpitations, dyspnea.  Inpatient Medications    Scheduled Meds: . aspirin EC  81 mg Oral Daily  . budesonide  9 mg Oral Daily  . Chlorhexidine Gluconate Cloth  6 each Topical Daily  . dorzolamide  1 drop Right Eye BID  . enoxaparin (LOVENOX) injection  40 mg Subcutaneous Q24H  . influenza vaccine adjuvanted  0.5 mL Intramuscular Tomorrow-1000  . insulin detemir  7 Units Subcutaneous Daily  . pneumococcal 23 valent vaccine  0.5 mL Intramuscular Tomorrow-1000  . rosuvastatin  20 mg Oral QHS  . sodium chloride flush  3 mL Intravenous Q12H  . sodium chloride flush  3 mL Intravenous Q12H  . sodium chloride flush  3 mL Intravenous Q12H   Continuous Infusions: . sodium chloride    . sodium chloride    . sodium chloride 10 mL/hr at 01/30/19 1604   PRN Meds: sodium chloride, sodium chloride, acetaminophen, ALPRAZolam, nitroGLYCERIN, ondansetron (ZOFRAN) IV, sodium chloride flush, sodium chloride flush, zolpidem   Vital Signs    Vitals:   01/31/19 0300 01/31/19 0400 01/31/19 0500 01/31/19 0600  BP: (!) 138/119 (!) 152/80 113/77 (!) 116/98  Pulse: (!) 59 (!) 59 (!) 59 (!) 59  Resp: 15 14 14 13   Temp:  97.8 F (36.6 C)    TempSrc:  Oral    SpO2: 98% 98% 98% 98%  Weight:      Height:        Intake/Output Summary (Last 24 hours) at 01/31/2019 0706 Last data filed at 01/31/2019 0600 Gross per 24 hour  Intake 397.18 ml  Output 3650 ml  Net -3252.82 ml   Filed Weights   01/30/19 1853  Weight: 81.6 kg    Telemetry    Paced rhythm, HR 60 - Personally  Reviewed  ECG    Normal axis, complete heart block, RBBB - Personally Reviewed  Physical Exam   Gen: Well-developed, well nourished, NAD HEENT: NCAT head, hearing aids non-functioning, EOMI,  Neck: supple, ROM intact CV: RRR, S1, S2 normal, No rubs, no murmurs, no gallops, pacer in place Pulm: CTAB, No rales, no wheezes Abd: Soft, BS+, NTND, No rebound, no guarding Extm: ROM intact, Peripheral pulses intact, No peripheral edema Skin: Dry, Warm, normal turgor, no rashes, lesions, wounds.  Neuro: AAOx3   Labs    Chemistry Recent Labs  Lab 01/30/19 1204  NA 126*  K 4.5  CL 93*  CO2 23  GLUCOSE 95  BUN 21  CREATININE 1.14  CALCIUM 9.0  PROT 6.5  ALBUMIN 3.8  AST 18  ALT 22  ALKPHOS 29*  BILITOT 0.7  GFRNONAA 60*  GFRAA >60  ANIONGAP 10     Hematology Recent Labs  Lab 01/30/19 1204  WBC 6.6  RBC 3.85*  HGB 12.4*  HCT 35.7*  MCV 92.7  MCH 32.2  MCHC 34.7  RDW 12.4  PLT 198    Cardiac EnzymesNo results for input(s): TROPONINI in the last 168 hours. No results for input(s): TROPIPOC in the last 168 hours.   BNP Recent Labs  Lab 01/30/19 1204  BNP 625.9*  DDimer No results for input(s): DDIMER in the last 168 hours.   Radiology    No results found.  Cardiac Studies   TTE 01/30/19 IMPRESSIONS  1. Left ventricular ejection fraction, by visual estimation, is 50 to 55%. The left ventricle has low normal function. There is no left ventricular hypertrophy.  2. Abnormal septal motion consistent with RV pacemaker.  3. Left ventricular diastolic parameters are consistent with Grade I diastolic dysfunction (impaired relaxation).  4. Global right ventricle has normal systolic function.The right ventricular size is normal. No increase in right ventricular wall thickness.  5. Left atrial size was normal.  6. Right atrial size was normal.  7. Mild to moderate mitral annular calcification.  8. The mitral valve is degenerative. No evidence of mitral  valve regurgitation. No evidence of mitral stenosis.  9. The tricuspid valve is grossly normal. Tricuspid valve regurgitation is trivial. 10. The aortic valve is tricuspid. Aortic valve regurgitation is not visualized. Mild to moderate aortic valve sclerosis/calcification without any evidence of aortic stenosis. 11. The pulmonic valve was grossly normal. Pulmonic valve regurgitation is not visualized.  Patient Profile     84 y.o. male w/ PMH of CABG x3, HTN, HLD and DM present with syncope found to have complete heart block s/p temporary pacer placement.  Assessment & Plan    Complete Heart Block Presented w/ fatigue, weakness, and syncopal episodes. Found to be in complete heart block. Initially external paced but temp wires placed yesterday. Last dose of metoprolol tartrate yesterday am? Should be washed out at this point. Still in heart block. Likely will need permanent pacemaker. BP stable this am. - Plan to discuss w/ pt and family regarding permanent pacemaker - C/w pacing - Telemetry  Elevated Troponins CAD s/p CABG x3 HsTrop 174->249. TTE yesterday showing low normal EF but no wall motion abnormalities besides septum due to pacing wires. BNP 626 on admission. Denies any chest pain - Monitor vitals  - C/w home meds: asa, rosuvastatin  HTN Holding home bp meds due to NPO yesterday. Current bp this am 126/73 - Can resume at discharge  DM - SSI - Glucose checks - C/w levemir 7 units qhs  For questions or updates, please contact [facility] Please consult [email or web address] for contact info under Cardiology/STEMI.   Signed, Gilberto Better, MD PGY-2, [facility] Pager: [phone] 01/31/2019, 7:06 AM    Attending attestation to follow

## 2019-01-31 NOTE — Plan of Care (Signed)
°  Problem: Education: °Goal: Knowledge of cardiac device and self-care will improve °Outcome: Progressing °Goal: Ability to safely manage health related needs after discharge will improve °Outcome: Progressing °  °

## 2019-01-31 NOTE — Significant Event (Signed)
Received report from cath lab that patient is not coming back to unit 2H25 and is to be transferred to 6E12 from holding area in cath lab. RN updated patient's spouse and daughter who are at bedside and took patient's family to his new room in 6E12. All personal belongings accounted for by patient's daughter Shirlean Mylar. Report given to receiving RN Colletta [place].    Andra Matsuo

## 2019-01-31 NOTE — Consult Note (Addendum)
ELECTROPHYSIOLOGY CONSULT NOTE    Patient ID: Nathan Howell MRN: [id number], DOB/AGE: [birth year] 84 y.o.  Admit date: 01/30/2019 Date of Consult: 01/31/2019  Primary Physician: Burnard Bunting, MD Primary Cardiologist: Dr. Claiborne Billings Electrophysiologist: New to Dr. Rayann Heman  Referring Provider: Dr. Debara Pickett  Patient Profile: Nathan Howell is a 84 y.o. male with a history of CABG times 06/2003 with LIMA-LAD, SVG-RI, SVG-CFX, DM2, HTN, HLD, MV 2012 ok, EF 65% with moderate concentric LVH, grade 1 DD, GERD who is being seen today for the evaluation of CHB at the request of Dr. Debara Pickett.  HPI:  Nathan Howell is a 84 y.o. male with recent history of weakness, fatigue, and syncopal episodes. Had low HR in office with Dr. Claiborne Billings and sent to ED. Noted to be in CHB with HR in the 20s. Pt externally paced with improvement of symptoms. Taken for temp wire. He was on lopressor 25 mg BID which was held, last dose yesterday am.     Examined this am and still with underlying CHB, with HRs in the 40s. EP asked to see to evaluate for PPM.   He is feeling OK now with pacing. He has no previous history of syncope. At this time, he denies CP, palpitations, dyspnea, orthopnea, or edema. No further pre-syncope/syncope since admit.   Past Medical History:  Diagnosis Date  . Abdominal hernia   . Asthma    [place] - HFA 50% 03/16/09 > 50% 05/13/10  . Collagenous colitis   . COPD (chronic obstructive pulmonary disease) ([place]) 2015  . Coronary artery disease 2005  . Diverticulosis   . GERD (gastroesophageal reflux disease)   . Glaucoma   . History of stress test 02/2011   Which showed mild inferior thinning felt to be diaphragmatic attenuation artifact without ichemia. Poststress EF was 65%.  [name] Hx of echocardiogram    EF>55% this showed moderate concentric LVH with normal systolic function. There was evidence for grade 1 diastolic relaxation abnormality with possible very small region of apical lateral  hypokinesis.He did have moderate left atrial dilatation by volume assessment. There was mild mitral annular calcification with trace MR, trace TR, mild aortic valve sclerosis without stenosis    . Hyperlipidemia   . Hypertension   . Rhinitis medicamentosa    per remote [place] w/u  . Type II diabetes mellitus ([place])      Surgical History:  Past Surgical History:  Procedure Laterality Date  . CARDIAC CATHETERIZATION  02/2004   Normal left ventricular function. Significant multivessel coronary artery disease corresponding to left main equivalent disease with diffuse tubular narrowing of 50% in the proximal left anterior descending, 905 in the ramus intermadius vessel and 80% in the most proximal circumflex veseel with 70% mid A-V groove circumflex stenosis.  [name] CATARACT EXTRACTION W/ INTRAOCULAR LENS IMPLANT Right 2003   . CORNEAL TRANSPLANT Right ~ 2004  . CORONARY ANGIOPLASTY    . CORONARY ARTERY BYPASS GRAFT     x3 By Dr Servando Snare with LIMA to the LAD, a vein to the intermediate, a vein to the distal circumflex. [name] was nondominant and not bypassed.  [name] GLAUCOMA VALVE INSERTION Right   . HAND SURGERY Right    "don't remember what was done"  . [place]   "not sure which side"  . KNEE ARTHROSCOPY Bilateral   . TEMPORARY PACEMAKER N/A 01/30/2019   Procedure: TEMPORARY PACEMAKER;  Surgeon: Burnell Blanks, MD;  Location: [facility];  Service: Cardiovascular;  Laterality: N/A;     Medications Prior to Admission  Medication Sig Dispense Refill Last Dose  . amLODipine (NORVASC) 5 MG tablet Take 5 mg by mouth daily.   01/30/2019 at am  . aspirin 81 MG tablet Take 81 mg by mouth daily.    01/30/2019 at 0830  . dorzolamide-timolol (COSOPT) 22.3-6.8 MG/ML ophthalmic solution Place 1 drop into the right eye 2 (two) times daily.   01/30/2019 at Unknown time  . glipiZIDE (GLUCOTROL) 5 MG tablet Take 5 mg by mouth 2 (two) times daily before a meal.     01/30/2019 at am  . Insulin Detemir (LEVEMIR FLEXTOUCH) 100 UNIT/ML Pen Inject 17 Units into the skin daily before breakfast.   01/30/2019 at am  . losartan (COZAAR) 100 MG tablet Take 100 mg by mouth at bedtime.    01/29/2019 at pm  . metFORMIN (GLUCOPHAGE-XR) 500 MG 24 hr tablet Take 1,000 mg by mouth daily with breakfast.   01/30/2019 at am  . metoprolol tartrate (LOPRESSOR) 25 MG tablet Take 25 mg by mouth 2 (two) times daily.   01/30/2019 at 0830  . rosuvastatin (CRESTOR) 20 MG tablet Take 1 tablet (20 mg total) by mouth daily. (Patient taking differently: Take 20 mg by mouth at bedtime. ) 90 tablet 1 01/29/2019 at pm  . triamterene-hydrochlorothiazide (MAXZIDE-25) 37.5-25 MG tablet Take 1 tablet by mouth daily.   01/30/2019 at am  . budesonide (ENTOCORT EC) 3 MG 24 hr capsule Take 3 capsules (9 mg total) by mouth daily. 90 capsule 3 See LF at unk    Inpatient Medications:  . aspirin EC  81 mg Oral Daily  . budesonide  9 mg Oral Daily  . Chlorhexidine Gluconate Cloth  6 each Topical Daily  . dorzolamide  1 drop Right Eye BID  . enoxaparin (LOVENOX) injection  40 mg Subcutaneous Q24H  . influenza vaccine adjuvanted  0.5 mL Intramuscular Tomorrow-1000  . insulin detemir  7 Units Subcutaneous Daily  . pneumococcal 23 valent vaccine  0.5 mL Intramuscular Tomorrow-1000  . rosuvastatin  20 mg Oral QHS  . sodium chloride flush  3 mL Intravenous Q12H  . sodium chloride flush  3 mL Intravenous Q12H  . sodium chloride flush  3 mL Intravenous Q12H    Allergies: No Known Allergies  Social History   Socioeconomic History  . Marital status: Married    Spouse name: Not on file  . Number of children: 3  . Years of education: Not on file  . Highest education level: Not on file  Occupational History  . Occupation: Retired  Scientific laboratory technician  . Financial resource strain: Not on file  . Food insecurity    Worry: Not on file    Inability: Not on file  . Transportation needs    Medical: Not on  file    Non-medical: Not on file  Tobacco Use  . Smoking status: Never Smoker  . Smokeless tobacco: Current User    Types: Snuff  Substance and Sexual Activity  . Alcohol use: No    Alcohol/week: 0.0 standard drinks  . Drug use: No  . Sexual activity: Not Currently  Lifestyle  . Physical activity    Days per week: Not on file    Minutes per session: Not on file  . Stress: Not on file  Relationships  . Social Herbalist on phone: Not on file    Gets together: Not on file    Attends religious service: Not on  file    Active member of club or organization: Not on file    Attends meetings of clubs or organizations: Not on file    Relationship status: Not on file  . Intimate partner violence    Fear of current or ex partner: Not on file    Emotionally abused: Not on file    Physically abused: Not on file    Forced sexual activity: Not on file  Other Topics Concern  . Not on file  Social History Narrative   Never smoked cigs regularly but stopped all exposure 4s   Lives with wife     Family History  Problem Relation Age of Onset  . Asthma Mother   . Emphysema Father   . Colon cancer Neg Hx      Review of Systems: All other systems reviewed and are otherwise negative except as noted above.  Physical Exam: Vitals:   01/31/19 0600 01/31/19 0700 01/31/19 0800 01/31/19 0816  BP: (!) 116/98 112/73 126/73   Pulse: (!) 59 (!) 59 (!) 59   Resp: 13 14 13    Temp:    98.7 F (37.1 C)  TempSrc:    Oral  SpO2: 98% 97% 98%   Weight:      Height:        GEN- The patient is well appearing, alert and oriented x 3 today.   HEENT: normocephalic, atraumatic; sclera clear, conjunctiva pink; hearing intact; oropharynx clear; neck supple Lungs- Clear to ausculation bilaterally, normal work of breathing.  No wheezes, rales, rhonchi Heart- Regular rate and rhythm, no murmurs, rubs or gallops GI- soft, non-tender, non-distended, bowel sounds present Extremities- no  clubbing, cyanosis, or edema; DP/PT/radial pulses 2+ bilaterally MS- no significant deformity or atrophy Skin- warm and dry, no rash or lesion Psych- euthymic mood, full affect Neuro- strength and sensation are intact  Labs:   Lab Results  Component Value Date   WBC 6.2 01/31/2019   HGB 12.3 (L) 01/31/2019   HCT 35.5 (L) 01/31/2019   MCV 92.4 01/31/2019   PLT 206 01/31/2019    Recent Labs  Lab 01/30/19 1204 01/31/19 0559  NA 126* 125*  K 4.5 4.1  CL 93* 93*  CO2 23 23  BUN 21 17  CREATININE 1.14 0.95  CALCIUM 9.0 8.9  PROT 6.5  --   BILITOT 0.7  --   ALKPHOS 29*  --   ALT 22  --   AST 18  --   GLUCOSE 95 122*      Radiology/Studies: No results found.  EKG: This am shows CHB with escape rate @ 42 bpm (personally reviewed)  TELEMETRY: V paced at 60, with underlying CHB and escape in 40s when turned down (personally reviewed)  Assessment/Plan: 1.  Symptomatic bradycardia in the setting of CHB - Last dose of lopressor 25 mg yesterday am, so should be washed out.  - Pt has CHB without reversible cause. EF 50-55% - Reviewed risks and benefits of traditional PPM approach and leadless PPM approach. Will discuss further with Dr. Rayann Heman. Likely plan PPM implantation today.   2. CAD s/p CABG x3 -> elevated troponins - HS-Trop 174 -> 249.  TTE 10/29 EF 50-55% without WMA.  - No ischemic symptoms  3. HTN - Meds on hold. Plan on resuming as tolerated/once paced.  Leave NPO for likely PPM implantation today.   For questions or updates, please contact [place] Please consult [email or web address] for contact info under Cardiology/STEMI.  Signed, Shirley Friar, PA-C  Pager: [phone]  01/31/2019 9:02 AM  I have seen, examined the patient, and reviewed the above assessment and plan.  Changes to above are made where necessary.  On exam, RRR (paced).  He has a R femoral temp pacing wire in pace.  The patient has symptomatic complete heart block.  He has  chronic conduction system disease by review of old ekgs.  Metoprolol has washed out without resolution of AV block.  No other reversible causes are found.  I would therefore recommend pacemaker implantation at this time. We discussed risks, benefits, alternatives to traditional and leadless pacemaker implantation.  The patient is clear that he would prefer a leadless pacemaker.   The patient understands that the risks include but are not limited to bleeding, infection, pneumothorax, perforation, tamponade, vascular damage, renal failure, MI, stroke, death,  and lead dislodgement and wishes to proceed. We will therefore schedule the procedure at the next available time.  We also discussed remote monitoring and its role today.   Co Sign: Thompson Grayer, MD 01/31/2019 3:43 PM

## 2019-01-31 NOTE — H&P (View-Only) (Signed)
ELECTROPHYSIOLOGY CONSULT NOTE    Patient ID: [name] MRN: [id number], DOB/AGE: [birth year] 84 y.o.  Admit date: 01/30/2019 Date of Consult: 01/31/2019  Primary Physician: Burnard Bunting, MD Primary Cardiologist: Dr. Claiborne Billings Electrophysiologist: New to Dr. Rayann Heman  Referring Provider: Dr. Debara Pickett  Patient Profile: Nathan Howell is a 84 y.o. male with a history of CABG times 06/2003 with LIMA-LAD, SVG-RI, SVG-CFX, DM2, HTN, HLD, MV 2012 ok, EF 65% with moderate concentric LVH, grade 1 DD, GERD who is being seen today for the evaluation of CHB at the request of Dr. Debara Pickett.  HPI:  [name] is a 84 y.o. male with recent history of weakness, fatigue, and syncopal episodes. Had low HR in office with Dr. Claiborne Billings and sent to ED. Noted to be in CHB with HR in the 20s. Pt externally paced with improvement of symptoms. Taken for temp wire. He was on lopressor 25 mg BID which was held, last dose yesterday am.     Examined this am and still with underlying CHB, with HRs in the 40s. EP asked to see to evaluate for PPM.   He is feeling OK now with pacing. He has no previous history of syncope. At this time, he denies CP, palpitations, dyspnea, orthopnea, or edema. No further pre-syncope/syncope since admit.   Past Medical History:  Diagnosis Date  . Abdominal hernia   . Asthma    [place] - HFA 50% 03/16/09 > 50% 05/13/10  . Collagenous colitis   . COPD (chronic obstructive pulmonary disease) ([place]) 2015  . Coronary artery disease 2005  . Diverticulosis   . GERD (gastroesophageal reflux disease)   . Glaucoma   . History of stress test 02/2011   Which showed mild inferior thinning felt to be diaphragmatic attenuation artifact without ichemia. Poststress EF was 65%.  [name] Hx of echocardiogram    EF>55% this showed moderate concentric LVH with normal systolic function. There was evidence for grade 1 diastolic relaxation abnormality with possible very small region of apical lateral  hypokinesis.He did have moderate left atrial dilatation by volume assessment. There was mild mitral annular calcification with trace MR, trace TR, mild aortic valve sclerosis without stenosis    . Hyperlipidemia   . Hypertension   . Rhinitis medicamentosa    per remote [place] w/u  . Type II diabetes mellitus ([place])      Surgical History:  Past Surgical History:  Procedure Laterality Date  . CARDIAC CATHETERIZATION  02/2004   Normal left ventricular function. Significant multivessel coronary artery disease corresponding to left main equivalent disease with diffuse tubular narrowing of 50% in the proximal left anterior descending, 905 in the ramus intermadius vessel and 80% in the most proximal circumflex veseel with 70% mid A-V groove circumflex stenosis.  [name] CATARACT EXTRACTION W/ INTRAOCULAR LENS IMPLANT Right 2003   . CORNEAL TRANSPLANT Right ~ 2004  . CORONARY ANGIOPLASTY    . CORONARY ARTERY BYPASS GRAFT     x3 By Dr Servando Snare with LIMA to the LAD, a vein to the intermediate, a vein to the distal circumflex. [name] was nondominant and not bypassed.  [name] GLAUCOMA VALVE INSERTION Right   . HAND SURGERY Right    "don't remember what was done"  . [place]   "not sure which side"  . KNEE ARTHROSCOPY Bilateral   . TEMPORARY PACEMAKER N/A 01/30/2019   Procedure: TEMPORARY PACEMAKER;  Surgeon: Burnell Blanks, MD;  Location: [facility];  Service: Cardiovascular;  Laterality: N/A;     Medications Prior to Admission  Medication Sig Dispense Refill Last Dose  . amLODipine (NORVASC) 5 MG tablet Take 5 mg by mouth daily.   01/30/2019 at am  . aspirin 81 MG tablet Take 81 mg by mouth daily.    01/30/2019 at 0830  . dorzolamide-timolol (COSOPT) 22.3-6.8 MG/ML ophthalmic solution Place 1 drop into the right eye 2 (two) times daily.   01/30/2019 at Unknown time  . glipiZIDE (GLUCOTROL) 5 MG tablet Take 5 mg by mouth 2 (two) times daily before a meal.     01/30/2019 at am  . Insulin Detemir (LEVEMIR FLEXTOUCH) 100 UNIT/ML Pen Inject 17 Units into the skin daily before breakfast.   01/30/2019 at am  . losartan (COZAAR) 100 MG tablet Take 100 mg by mouth at bedtime.    01/29/2019 at pm  . metFORMIN (GLUCOPHAGE-XR) 500 MG 24 hr tablet Take 1,000 mg by mouth daily with breakfast.   01/30/2019 at am  . metoprolol tartrate (LOPRESSOR) 25 MG tablet Take 25 mg by mouth 2 (two) times daily.   01/30/2019 at 0830  . rosuvastatin (CRESTOR) 20 MG tablet Take 1 tablet (20 mg total) by mouth daily. (Patient taking differently: Take 20 mg by mouth at bedtime. ) 90 tablet 1 01/29/2019 at pm  . triamterene-hydrochlorothiazide (MAXZIDE-25) 37.5-25 MG tablet Take 1 tablet by mouth daily.   01/30/2019 at am  . budesonide (ENTOCORT EC) 3 MG 24 hr capsule Take 3 capsules (9 mg total) by mouth daily. 90 capsule 3 See LF at unk    Inpatient Medications:  . aspirin EC  81 mg Oral Daily  . budesonide  9 mg Oral Daily  . Chlorhexidine Gluconate Cloth  6 each Topical Daily  . dorzolamide  1 drop Right Eye BID  . enoxaparin (LOVENOX) injection  40 mg Subcutaneous Q24H  . influenza vaccine adjuvanted  0.5 mL Intramuscular Tomorrow-1000  . insulin detemir  7 Units Subcutaneous Daily  . pneumococcal 23 valent vaccine  0.5 mL Intramuscular Tomorrow-1000  . rosuvastatin  20 mg Oral QHS  . sodium chloride flush  3 mL Intravenous Q12H  . sodium chloride flush  3 mL Intravenous Q12H  . sodium chloride flush  3 mL Intravenous Q12H    Allergies: No Known Allergies  Social History   Socioeconomic History  . Marital status: Married    Spouse name: Not on file  . Number of children: 3  . Years of education: Not on file  . Highest education level: Not on file  Occupational History  . Occupation: Retired  Scientific laboratory technician  . Financial resource strain: Not on file  . Food insecurity    Worry: Not on file    Inability: Not on file  . Transportation needs    Medical: Not on  file    Non-medical: Not on file  Tobacco Use  . Smoking status: Never Smoker  . Smokeless tobacco: Current User    Types: Snuff  Substance and Sexual Activity  . Alcohol use: No    Alcohol/week: 0.0 standard drinks  . Drug use: No  . Sexual activity: Not Currently  Lifestyle  . Physical activity    Days per week: Not on file    Minutes per session: Not on file  . Stress: Not on file  Relationships  . Social Herbalist on phone: Not on file    Gets together: Not on file    Attends religious service: Not on  file    Active member of club or organization: Not on file    Attends meetings of clubs or organizations: Not on file    Relationship status: Not on file  . Intimate partner violence    Fear of current or ex partner: Not on file    Emotionally abused: Not on file    Physically abused: Not on file    Forced sexual activity: Not on file  Other Topics Concern  . Not on file  Social History Narrative   Never smoked cigs regularly but stopped all exposure 6s   Lives with wife     Family History  Problem Relation Age of Onset  . Asthma Mother   . Emphysema Father   . Colon cancer Neg Hx      Review of Systems: All other systems reviewed and are otherwise negative except as noted above.  Physical Exam: Vitals:   01/31/19 0600 01/31/19 0700 01/31/19 0800 01/31/19 0816  BP: (!) 116/98 112/73 126/73   Pulse: (!) 59 (!) 59 (!) 59   Resp: 13 14 13    Temp:    98.7 F (37.1 C)  TempSrc:    Oral  SpO2: 98% 97% 98%   Weight:      Height:        GEN- The patient is well appearing, alert and oriented x 3 today.   HEENT: normocephalic, atraumatic; sclera clear, conjunctiva pink; hearing intact; oropharynx clear; neck supple Lungs- Clear to ausculation bilaterally, normal work of breathing.  No wheezes, rales, rhonchi Heart- Regular rate and rhythm, no murmurs, rubs or gallops GI- soft, non-tender, non-distended, bowel sounds present Extremities- no  clubbing, cyanosis, or edema; DP/PT/radial pulses 2+ bilaterally MS- no significant deformity or atrophy Skin- warm and dry, no rash or lesion Psych- euthymic mood, full affect Neuro- strength and sensation are intact  Labs:   Lab Results  Component Value Date   WBC 6.2 01/31/2019   HGB 12.3 (L) 01/31/2019   HCT 35.5 (L) 01/31/2019   MCV 92.4 01/31/2019   PLT 206 01/31/2019    Recent Labs  Lab 01/30/19 1204 01/31/19 0559  NA 126* 125*  K 4.5 4.1  CL 93* 93*  CO2 23 23  BUN 21 17  CREATININE 1.14 0.95  CALCIUM 9.0 8.9  PROT 6.5  --   BILITOT 0.7  --   ALKPHOS 29*  --   ALT 22  --   AST 18  --   GLUCOSE 95 122*      Radiology/Studies: No results found.  EKG: This am shows CHB with escape rate @ 42 bpm (personally reviewed)  TELEMETRY: V paced at 60, with underlying CHB and escape in 40s when turned down (personally reviewed)  Assessment/Plan: 1.  Symptomatic bradycardia in the setting of CHB - Last dose of lopressor 25 mg yesterday am, so should be washed out.  - Pt has CHB without reversible cause. EF 50-55% - Reviewed risks and benefits of traditional PPM approach and leadless PPM approach. Will discuss further with Dr. Rayann Heman. Likely plan PPM implantation today.   2. CAD s/p CABG x3 -> elevated troponins - HS-Trop 174 -> 249.  TTE 10/29 EF 50-55% without WMA.  - No ischemic symptoms  3. HTN - Meds on hold. Plan on resuming as tolerated/once paced.  Leave NPO for likely PPM implantation today.   For questions or updates, please contact [place] Please consult [email or web address] for contact info under Cardiology/STEMI.  Signed, Shirley Friar, PA-C  Pager: [phone]  01/31/2019 9:02 AM  I have seen, examined the patient, and reviewed the above assessment and plan.  Changes to above are made where necessary.  On exam, RRR (paced).  He has a R femoral temp pacing wire in pace.  The patient has symptomatic complete heart block.  He has  chronic conduction system disease by review of old ekgs.  Metoprolol has washed out without resolution of AV block.  No other reversible causes are found.  I would therefore recommend pacemaker implantation at this time. We discussed risks, benefits, alternatives to traditional and leadless pacemaker implantation.  The patient is clear that he would prefer a leadless pacemaker.   The patient understands that the risks include but are not limited to bleeding, infection, pneumothorax, perforation, tamponade, vascular damage, renal failure, MI, stroke, death,  and lead dislodgement and wishes to proceed. We will therefore schedule the procedure at the next available time.  We also discussed remote monitoring and its role today.   Co Sign: Thompson Grayer, MD 01/31/2019 3:43 PM

## 2019-01-31 NOTE — Discharge Instructions (Signed)

## 2019-01-31 NOTE — Progress Notes (Addendum)
Patient noted to be markedly dry with low central venous pressure during EP procedure today.  Low sodium also noted. Will given 1 L NS overnight.   Anticipate discharge to home in am with routine wound care and follow-up.  Thompson Grayer MD, [place] 01/31/2019 11:56 PM

## 2019-01-31 NOTE — Interval H&P Note (Signed)
History and Physical Interval Note:  01/31/2019 3:45 PM  Nathan Howell  has presented today for surgery, with the diagnosis of heart block.  The various methods of treatment have been discussed with the patient and family. After consideration of risks, benefits and other options for treatment, the patient has consented to  Procedure(s): PACEMAKER LEADLESS INSERTION (N/A) as a surgical intervention.  The patient's history has been reviewed, patient examined, no change in status, stable for surgery.  I have reviewed the patient's chart and labs.  Questions were answered to the patient's satisfaction.     Thompson Grayer

## 2019-02-01 ENCOUNTER — Inpatient Hospital Stay (HOSPITAL_COMMUNITY): Payer: Medicare HMO

## 2019-02-01 ENCOUNTER — Encounter (HOSPITAL_COMMUNITY): Payer: Self-pay | Admitting: Cardiology

## 2019-02-01 DIAGNOSIS — I442 Atrioventricular block, complete: Secondary | ICD-10-CM | POA: Diagnosis not present

## 2019-02-01 DIAGNOSIS — Z950 Presence of cardiac pacemaker: Secondary | ICD-10-CM

## 2019-02-01 HISTORY — DX: Presence of cardiac pacemaker: Z95.0

## 2019-02-01 LAB — BASIC METABOLIC PANEL WITH GFR
Anion gap: 12 (ref 5–15)
BUN: 20 mg/dL (ref 8–23)
CO2: 23 mmol/L (ref 22–32)
Calcium: 8.7 mg/dL — ABNORMAL LOW (ref 8.9–10.3)
Chloride: 92 mmol/L — ABNORMAL LOW (ref 98–111)
Creatinine, Ser: 1.09 mg/dL (ref 0.61–1.24)
GFR calc Af Amer: >60 mL/min
GFR calc non Af Amer: >60 mL/min
Glucose, Bld: 131 mg/dL — ABNORMAL HIGH (ref 70–99)
Potassium: 3.9 mmol/L (ref 3.5–5.1)
Sodium: 127 mmol/L — ABNORMAL LOW (ref 135–145)

## 2019-02-01 MED ORDER — ACETAMINOPHEN 325 MG PO TABS: 650.0000 mg | ORAL_TABLET | ORAL | Status: DC | PRN

## 2019-02-01 MED ORDER — METFORMIN HCL ER 500 MG PO TB24: 1000.0000 mg | ORAL_TABLET | Freq: Every day | ORAL | Status: AC

## 2019-02-01 NOTE — Discharge Summary (Addendum)
Discharge Summary    Patient ID: Nathan Howell MRN: [id number]; DOB: Sep 27, 1934  Admit date: 01/30/2019 Discharge date: 02/01/2019  Primary Care Provider: Burnard Bunting, MD  Primary Cardiologist: Shelva Majestic, MD  Primary Electrophysiologist:  None   Discharge Diagnoses    Principal Problem:   Complete heart block [facility]) Active Problems:   S/P placement of cardiac pacemaker 01/31/19 leadless MDT   Diabetes mellitus without complication ([place])   Symptomatic bradycardia   Malnutrition of moderate degree    Diagnostic Studies/Procedures   Pacer insertion 01/31/19  PREPROCEDURE DIAGNOSIS: 1. Complete heart block  POSTPROCEDURE DIAGNOSIS:  1. Complete heart block  PROCEDURES:  1.  Leadless Pacemaker implantation.  2.  Temporary pacing wire removal  INTRODUCTION:  Nathan Howell is a 84 y.o. male with a history of symptomatic complete heart block who presents today for pacemaker implantation.     DESCRIPTION OF PROCEDURE: Informed written consent was obtained, and the patient was brought to the electrophysiology lab in a fasting state. The patient received sedation as outlined in the procedure log. Using a percutaneous Seldinger technique, an 8-French was placed into the left common femoral vein.  Direct Ultrasound guidance was used today for left common femoral venous access with normal vessel patency.  Ultrasound images are captured and stored in the patients chart.   The 8 french sheath was exchanged for an 27 french sheath. A Medtronic Micra AV model [id number] (SN [id number]) Leadless Cardiac Pacemaker was advanced through the left femoral vein into the right ventricular apical septal position and fixed to the myocardial wall.  In this location, R waves measured 6 mV with an impedance of  1540 Ohms and a threshold of 0.38 V@0 .23msec. Stability of the device was demonstrated with a "tug test". The device was disconnected from the implantation apparatus which was then  removed from the body and the sheaths were aspirated and flushed.  The previously paced R femoral temporary pacing wire was removed under fluoroscopic visualization.  Both sheaths were removed and hemostasis was assured with PerClose. EBL<19ml.  There were no early apparent complications.    Permanent Pacemaker Indication: Documented non-reversible symptomatic bradycardia due to second degree and/or third degree atrioventricular block.  During this procedure the patient is administered a total of Versed 1 mg and Fentanyl 12.5 mg to achieve and maintain moderate conscious sedation.  The patient's heart rate, blood pressure, and oxygen saturation are monitored continuously during the procedure. The period of conscious sedation is 39 minutes, of which I was present face-to-face 100% of this time.   CONCLUSIONS:  1.Successful implantation of a Medtronic Micra leadless pacemaker.  2. No early apparent complications.   James Allred,MD _____________  ECHO 01/30/19 IMPRESSIONS    1. Left ventricular ejection fraction, by visual estimation, is 50 to 55%. The left ventricle has low normal function. There is no left ventricular hypertrophy.  2. Abnormal septal motion consistent with RV pacemaker.  3. Left ventricular diastolic parameters are consistent with Grade I diastolic dysfunction (impaired relaxation).  4. Global right ventricle has normal systolic function.The right ventricular size is normal. No increase in right ventricular wall thickness.  5. Left atrial size was normal.  6. Right atrial size was normal.  7. Mild to moderate mitral annular calcification.  8. The mitral valve is degenerative. No evidence of mitral valve regurgitation. No evidence of mitral stenosis.  9. The tricuspid valve is grossly normal. Tricuspid valve regurgitation is trivial. 10. The aortic valve is tricuspid. Aortic valve regurgitation is not  visualized. Mild to moderate aortic valve sclerosis/calcification  without any evidence of aortic stenosis. 11. The pulmonic valve was grossly normal. Pulmonic valve regurgitation is not visualized.  In comparison to the previous echocardiogram(s): There are no prior studies on this patient for comparison purposes. FINDINGS  Left Ventricle: Left ventricular ejection fraction, by visual estimation, is 50 to 55%. The left ventricle has low normal function. There is no left ventricular hypertrophy. Abnormal (paradoxical) septal motion, consistent with RV pacemaker. Left  ventricular diastolic parameters are consistent with Grade I diastolic dysfunction (impaired relaxation). Normal left atrial pressure.  Right Ventricle: The right ventricular size is normal. No increase in right ventricular wall thickness. Global RV systolic function is has normal systolic function.  Left Atrium: Left atrial size was normal in size.  Right Atrium: Right atrial size was normal in size  Pericardium: There is no evidence of pericardial effusion.  Mitral Valve: The mitral valve is degenerative in appearance. There is mild thickening of the mitral valve leaflet(s). There is mild calcification of the mitral valve leaflet(s). Mild to moderate mitral annular calcification. No evidence of mitral valve  stenosis by observation. No evidence of mitral valve regurgitation.  Tricuspid Valve: The tricuspid valve is grossly normal. Tricuspid valve regurgitation is trivial.  Aortic Valve: The aortic valve is tricuspid. Aortic valve regurgitation is not visualized. Mild to moderate aortic valve sclerosis/calcification is present, without any evidence of aortic stenosis.  Pulmonic Valve: The pulmonic valve was grossly normal. Pulmonic valve regurgitation is not visualized.  Aorta: The aortic root is normal in size and structure.  Venous: The inferior vena cava was not well visualized.  IAS/Shunts: No atrial level shunt detected by color flow Doppler.  01/30/19 Complete heart  block. Successful placement of a transvenous pacing wire from the right femoral vein  History of Present Illness     Nathan Howell is a 84 y.o. male with a history of CABG times 06/2003 with LIMA-LAD, SVG-RI, SVG-CFX, DM2,HTN, HLD, MV 2012 ok, EF 65%with moderate concentric LVH, grade 1 DD, GERD and admitted 01/30/19 after seen in office after syncopable episodes while with his wife who was the pt.and found in CHB and HR in the 20s with weakness, fatigue and syncopal episodes.  EMS called and transported to ER.  He wore external pacer, once in ER until could be taken for temp pacing wire.  His symptoms improved with pacing.   Hospital Course     Consultants: EP Dr. Rayann Heman , nutrition   Pt admitted and troponin elevated to 249, but felt due to external pacing.  With his lower BP and symptomatic CHB, temp pacer was placed 01/30/19 by Dr. Angelena Form   TTE with low normal EF and no wall motion abnormalities. His BB was stopped on arrival.  After 24 hours without BB pt continued with CHB.  His diabetes monitored and decrease of insulin.  EP was consulted for continued CHB.    After discussion with pt and family Pt underwent  PPM, leadless MDT placed [id number] without complications.  He was noted to be very dry and IV fluid given overnight.    His Na has been decreased since discharge. Cr. 1.09 and k+ 3.9    Day of discharge he is neg 3483,  His cxr  No pneumothorax, PNA or pulmonary edema noted.  EKG A sensing and V pacing Pt's device interrogated and evaluated by Dr. Lovena Le.  Dr. Lovena Le has seen and evaluated the pt and found stable for discharge.  Did the patient have an acute coronary syndrome (MI, NSTEMI, STEMI, etc) this admission?:  No                               Did the patient have a percutaneous coronary intervention (stent / angioplasty)?:  No.   _____________  Discharge Vitals Blood pressure (!) 130/56, pulse (!) 55, temperature 97.6 F (36.4 C), temperature source Oral,  resp. rate 13, height 5\' 10"  (1.778 m), weight 81.6 kg, SpO2 95 %.  Filed Weights   01/30/19 1853  Weight: 81.6 kg  Exam per Dr. Lovena Le  General:Pleasant affect, NAD Skin:Warm and dry, brisk capillary refill Heart:S1S2 RRR without murmur, gallup, rub or click Lungs:clear without rales, rhonchi, or wheezes [id number], non tender, + BS, do not palpate liver spleen or masses Ext:no lower ext edema, 2+ pedal pulses, 2+ radial pulses Neuro:alert and oriented, MAE, follows commands, + facial symmetry  Labs & Radiologic Studies    CBC Recent Labs    01/30/19 1204 01/31/19 0559  WBC 6.6 6.2  NEUTROABS 4.4  --   HGB 12.4* 12.3*  HCT 35.7* 35.5*  MCV 92.7 92.4  PLT 198 [id number]   Basic Metabolic Panel Recent Labs    01/31/19 0559 02/01/19 0431  NA 125* 127*  K 4.1 3.9  CL 93* 92*  CO2 23 23  GLUCOSE 122* 131*  BUN 17 20  CREATININE 0.95 1.09  CALCIUM 8.9 8.7*   Liver Function Tests Recent Labs    01/30/19 1204  AST 18  ALT 22  ALKPHOS 29*  BILITOT 0.7  PROT 6.5  ALBUMIN 3.8   No results for input(s): LIPASE, AMYLASE in the last 72 hours. High Sensitivity Troponin:   Recent Labs  Lab 01/30/19 1204 01/30/19 1422  TROPONINIHS 174* 249*    BNP Invalid input(s): POCBNP D-Dimer No results for input(s): DDIMER in the last 72 hours. Hemoglobin A1C No results for input(s): HGBA1C in the last 72 hours. Fasting Lipid Panel No results for input(s): CHOL, HDL, LDLCALC, TRIG, CHOLHDL, LDLDIRECT in the last 72 hours. Thyroid Function Tests Recent Labs    01/30/19 1422  TSH 2.958   _____________  Dg Chest 2 View  Result Date: 02/01/2019 CLINICAL DATA:  Pacemaker EXAM: CHEST - 2 VIEW COMPARISON:  Chest x-ray dated 02/16/2015. FINDINGS: Median sternotomy wires appear intact and stable in alignment. Heart size and mediastinal contours are within normal limits. Lungs are clear. No pleural effusion or pneumothorax is seen. No acute or suspicious osseous finding. IMPRESSION: 1.  No active cardiopulmonary disease. No evidence of pneumonia or pulmonary edema. 2. Median sternotomy wires. Electronically Signed   By: Franki Cabot M.D.   On: 02/01/2019 08:38   Disposition   Pt is being discharged home today in good condition.  Follow-up Plans & Appointments    Follow-up Information    Thompson Grayer, MD Follow up on 05/07/2019.   Specialty: Cardiology Why: at 2 pm for 3 month pacemaker check Contact information: [address] [place] [address] [place] Follow up on 02/13/2019.   Why: at 330 for post pacemaker placement check.  Contact information: [place] [phone]       Troy Sine, MD Follow up on 04/07/2019.   Specialty: Cardiology Why: at 2 pm  Contact information: [address]  [place] [address] [phone]            Discharge Medications   Allergies as of 02/01/2019   No Known Allergies     Medication List    TAKE these medications   acetaminophen 325 MG tablet Commonly known as: TYLENOL Take 2 tablets (650 mg total) by mouth every 4 (four) hours as needed for headache or mild pain.   amLODipine 5 MG tablet Commonly known as: NORVASC Take 5 mg by mouth daily.   aspirin 81 MG tablet Take 81 mg by mouth daily.   budesonide 3 MG 24 hr capsule Commonly known as: ENTOCORT EC Take 3 capsules (9 mg total) by mouth daily.   dorzolamide-timolol 22.3-6.8 MG/ML ophthalmic solution Commonly known as: COSOPT Place 1 drop into the right eye 2 (two) times daily.   glipiZIDE 5 MG tablet Commonly known as: GLUCOTROL Take 5 mg by mouth 2 (two) times daily before a meal.   Levemir FlexTouch 100 UNIT/ML Pen Generic drug: Insulin Detemir Inject 17 Units into the skin daily before breakfast.   losartan 100 MG tablet Commonly known as: COZAAR Take 100 mg by mouth at  bedtime.   metFORMIN 500 MG 24 hr tablet Commonly known as: GLUCOPHAGE-XR Take 2 tablets (1,000 mg total) by mouth daily with breakfast. Start taking on: February 03, 2019 What changed: These instructions start on February 03, 2019. If you are unsure what to do until then, ask your doctor or other care provider.   metoprolol tartrate 25 MG tablet Commonly known as: LOPRESSOR Take 25 mg by mouth 2 (two) times daily.   rosuvastatin 20 MG tablet Commonly known as: CRESTOR Take 1 tablet (20 mg total) by mouth daily. What changed: when to take this   triamterene-hydrochlorothiazide 37.5-25 MG tablet Commonly known as: MAXZIDE-25 Take 1 tablet by mouth daily.          Outstanding Labs/Studies     Duration of Discharge Encounter   Greater than 30 minutes including physician time.  Signed, Cecilie Kicks, NP 02/01/2019, 10:58 AM   EP Attending  Patient seen and examined. Agree with the findings as noted above. The patient is doing well s/p PPM insertion. PPM interrogation under my direction demonstrates normal leadless PM function. He will be discharged home with usual followup.  Mikle Bosworth.D.

## 2019-02-03 ENCOUNTER — Encounter (HOSPITAL_COMMUNITY): Payer: Self-pay | Admitting: Internal Medicine

## 2019-02-13 ENCOUNTER — Other Ambulatory Visit: Payer: Self-pay

## 2019-02-13 ENCOUNTER — Ambulatory Visit (INDEPENDENT_AMBULATORY_CARE_PROVIDER_SITE_OTHER): Payer: Medicare HMO | Admitting: Student

## 2019-02-13 DIAGNOSIS — I442 Atrioventricular block, complete: Secondary | ICD-10-CM | POA: Diagnosis not present

## 2019-02-13 LAB — CUP PACEART INCLINIC DEVICE CHECK
Date Time Interrogation Session: 20201112162121
Implantable Pulse Generator Implant Date: 20201030

## 2019-02-13 NOTE — Progress Notes (Signed)
Wound check appointment. Leadless PPM. Groin well healed without hematoma. Normal device function. Thresholds, sensing, and impedances consistent with implant measurements. AM-VP at 92.7%, which is acceptable range. Undersensing of A4 noted occasionally. Attempted to adjust sensing, which worsened A-V synchrony, so not changes made today. Device programmed with adaptive capture management for extra safety margin until 3 month visit. Histogram distribution appropriate for patient and level of activity. No mode switches or high ventricular rates noted. Patient educated about wound care, arm mobility, lifting restrictions. ROV in 3 months with Dr. Rayann Heman.   See scanned report.

## 2019-02-21 ENCOUNTER — Ambulatory Visit: Payer: Medicare HMO | Admitting: Physician Assistant

## 2019-03-13 ENCOUNTER — Encounter: Payer: Self-pay | Admitting: Physician Assistant

## 2019-03-13 ENCOUNTER — Other Ambulatory Visit: Payer: Self-pay

## 2019-03-13 ENCOUNTER — Ambulatory Visit: Payer: Medicare HMO | Admitting: Physician Assistant

## 2019-03-13 VITALS — BP 128/62 | HR 62 | Temp 97.5°F | Ht 69.5 in | Wt 175.2 lb

## 2019-03-13 DIAGNOSIS — K52831 Collagenous colitis: Secondary | ICD-10-CM | POA: Diagnosis not present

## 2019-03-13 MED ORDER — BUDESONIDE 3 MG PO CPEP: 6.0000 mg | ORAL_CAPSULE | Freq: Every day | ORAL | 5 refills | Status: DC

## 2019-03-13 NOTE — Progress Notes (Signed)
Reviewed

## 2019-03-13 NOTE — Progress Notes (Signed)
Chief Complaint: Follow-up diarrhea  HPI:    [name] is a 84 year old male with a past medical history as listed below including complete heart block status post pacemaker in October (01/30/2019 echo with LVEF 50-55%), known to Dr. Henrene Pastor, who returns clinic today for follow-up of diarrhea.    11/13/2018 patient seen in clinic.  Discussed he had a history of collagenous colitis diagnosed in 2016 and at that time he described having several loose stools daily most days of the week it was progressive over the past few months with sometimes more than 10 loose stools a day.  He had tried a gluten-free diet and also reduce Metformin dose without improvement.  With Imodium and Pepto he could reduce the frequency to just a few loose stools a day.  Patient had stool studies which were negative by his PCP.  Discussed may be his statin triggered his collagenous colitis.  He was started on Budesonide 9 mg daily.  Also discussed he could continue Pepto and Imodium as needed.    01/30/2019-02/01/2019 patient admitted to the hospital for complete heart block.  Patient had leadless pacemaker implantation.    Today, the patient presents to clinic accompanied by his daughter.  She does assist with his history and explains that he has been doing much better on the Budesonide 3 tabs per day and in fact has actually switch towards constipation now.  Patient tells me he may not have a bowel movement for 2 days and when he does it is somewhat hard to get out.  Also tells me it is very messy to clean up.  He has to actually use a washcloth after using wet wipes in order to feel clean.  Denies abdominal pain.    Denies fever, chills, blood in his stool or weight loss.  Past Medical History:  Diagnosis Date  . Abdominal hernia   . Asthma    [place] - HFA 50% 03/16/09 > 50% 05/13/10  . Collagenous colitis   . COPD (chronic obstructive pulmonary disease) ([place]) 2015  . Coronary artery disease 2005  . Diverticulosis   .  GERD (gastroesophageal reflux disease)   . Glaucoma   . History of stress test 02/2011   Which showed mild inferior thinning felt to be diaphragmatic attenuation artifact without ichemia. Poststress EF was 65%.  [name] Hx of echocardiogram    EF>55% this showed moderate concentric LVH with normal systolic function. There was evidence for grade 1 diastolic relaxation abnormality with possible very small region of apical lateral hypokinesis.He did have moderate left atrial dilatation by volume assessment. There was mild mitral annular calcification with trace MR, trace TR, mild aortic valve sclerosis without stenosis    . Hyperlipidemia   . Hypertension   . Rhinitis medicamentosa    per remote [place] w/u  . S/P placement of cardiac pacemaker 01/31/19 leadless  02/01/2019  . Type II diabetes mellitus ([place])     Past Surgical History:  Procedure Laterality Date  . CARDIAC CATHETERIZATION  02/2004   Normal left ventricular function. Significant multivessel coronary artery disease corresponding to left main equivalent disease with diffuse tubular narrowing of 50% in the proximal left anterior descending, 905 in the ramus intermadius vessel and 80% in the most proximal circumflex veseel with 70% mid A-V groove circumflex stenosis.  [name] CATARACT EXTRACTION W/ INTRAOCULAR LENS IMPLANT Right 2003   . CORNEAL TRANSPLANT Right ~ 2004  . CORONARY ANGIOPLASTY    . CORONARY ARTERY BYPASS GRAFT  x3 By Dr Servando Snare with LIMA to the LAD, a vein to the intermediate, a vein to the distal circumflex. [name] was nondominant and not bypassed.  [name] GLAUCOMA VALVE INSERTION Right   . HAND SURGERY Right    "don't remember what was done"  . [place]   "not sure which side"  . KNEE ARTHROSCOPY Bilateral   . PACEMAKER LEADLESS INSERTION N/A 01/31/2019   Procedure: PACEMAKER LEADLESS INSERTION;  Surgeon: Thompson Grayer, MD;  Location: [facility];  Service: Cardiovascular;  Laterality: N/A;  .  TEMPORARY PACEMAKER N/A 01/30/2019   Procedure: TEMPORARY PACEMAKER;  Surgeon: Burnell Blanks, MD;  Location: [facility];  Service: Cardiovascular;  Laterality: N/A;    Current Outpatient Medications  Medication Sig Dispense Refill  . acetaminophen (TYLENOL) 325 MG tablet Take 2 tablets (650 mg total) by mouth every 4 (four) hours as needed for headache or mild pain.    [name] amLODipine (NORVASC) 5 MG tablet Take 5 mg by mouth daily.    [name] aspirin 81 MG tablet Take 81 mg by mouth daily.     . budesonide (ENTOCORT EC) 3 MG 24 hr capsule Take 3 capsules (9 mg total) by mouth daily. 90 capsule 3  . dorzolamide-timolol (COSOPT) 22.3-6.8 MG/ML ophthalmic solution Place 1 drop into the right eye 2 (two) times daily.    [name] glipiZIDE (GLUCOTROL) 5 MG tablet Take 5 mg by mouth 2 (two) times daily before a meal.     . Insulin Detemir (LEVEMIR FLEXTOUCH) 100 UNIT/ML Pen Inject 17 Units into the skin daily before breakfast.    . losartan (COZAAR) 100 MG tablet Take 100 mg by mouth at bedtime.     . metFORMIN (GLUCOPHAGE-XR) 500 MG 24 hr tablet Take 2 tablets (1,000 mg total) by mouth daily with breakfast.    . metoprolol tartrate (LOPRESSOR) 25 MG tablet Take 25 mg by mouth 2 (two) times daily.    . rosuvastatin (CRESTOR) 20 MG tablet Take 1 tablet (20 mg total) by mouth daily. (Patient taking differently: Take 20 mg by mouth at bedtime. ) 90 tablet 1  . triamterene-hydrochlorothiazide (MAXZIDE-25) 37.5-25 MG tablet Take 1 tablet by mouth daily.     No current facility-administered medications for this visit.    Allergies as of 03/13/2019  . (No Known Allergies)    Family History  Problem Relation Age of Onset  . Asthma Mother   . Emphysema Father   . Colon cancer Neg Hx     Social History   Socioeconomic History  . Marital status: Married    Spouse name: Not on file  . Number of children: 3  . Years of education: Not on file  . Highest education level: Not on file   Occupational History  . Occupation: Retired  Tobacco Use  . Smoking status: Never Smoker  . Smokeless tobacco: Current User    Types: Snuff  Substance and Sexual Activity  . Alcohol use: No    Alcohol/week: 0.0 standard drinks  . Drug use: No  . Sexual activity: Not Currently  Other Topics Concern  . Not on file  Social History Narrative   Never smoked cigs regularly but stopped all exposure 4s   Lives with wife   Social Determinants of Health   Financial Resource Strain:   . Difficulty of Paying Living Expenses: Not on file  Food Insecurity:   . Worried About Charity fundraiser in the Last Year: Not  on file  . Ran Out of Food in the Last Year: Not on file  Transportation Needs:   . Lack of Transportation (Medical): Not on file  . Lack of Transportation (Non-Medical): Not on file  Physical Activity:   . Days of Exercise per Week: Not on file  . Minutes of Exercise per Session: Not on file  Stress:   . Feeling of Stress : Not on file  Social Connections:   . Frequency of Communication with Friends and Family: Not on file  . Frequency of Social Gatherings with Friends and Family: Not on file  . Attends Religious Services: Not on file  . Active Member of Clubs or Organizations: Not on file  . Attends Archivist Meetings: Not on file  . Marital Status: Not on file  Intimate Partner Violence:   . Fear of Current or Ex-Partner: Not on file  . Emotionally Abused: Not on file  . Physically Abused: Not on file  . Sexually Abused: Not on file    Review of Systems:    Constitutional: No weight loss, fever or chills Cardiovascular: No chest pain Respiratory: No SOB  Gastrointestinal: See HPI and otherwise negative   Physical Exam:  Vital signs: BP 128/62   Pulse 62   Temp (!) 97.5 F (36.4 C)   Ht 5\' 7"  (1.702 m)   Wt 175 lb 3.2 oz (79.5 kg)   BMI 27.44 kg/m   Constitutional:   Pleasant Caucasian male appears to be in NAD, Well developed, Well  nourished, alert and cooperative Respiratory: Respirations even and unlabored. Lungs clear to auscultation bilaterally.   No wheezes, crackles, or rhonchi.  Cardiovascular: Normal S1, S2. No MRG. Regular rate and rhythm. No peripheral edema, cyanosis or pallor.  Gastrointestinal:  Soft, nondistended, nontender. No rebound or guarding. Normal bowel sounds. No appreciable masses or hepatomegaly. Rectal:  Not performed.  Psychiatric: Demonstrates good judgement and reason without abnormal affect or behaviors.  RELEVANT LABS AND IMAGING: CBC    Component Value Date/Time   WBC 6.2 01/31/2019 0559   RBC 3.84 (L) 01/31/2019 0559   HGB 12.3 (L) 01/31/2019 0559   HCT 35.5 (L) 01/31/2019 0559   PLT 206 01/31/2019 0559   MCV 92.4 01/31/2019 0559   MCH 32.0 01/31/2019 0559   MCHC 34.6 01/31/2019 0559   RDW 12.5 01/31/2019 0559   LYMPHSABS 1.3 01/30/2019 1204   MONOABS 0.8 01/30/2019 1204   EOSABS 0.1 01/30/2019 1204   BASOSABS 0.0 01/30/2019 1204    CMP     Component Value Date/Time   NA 127 (L) 02/01/2019 0431   K 3.9 02/01/2019 0431   CL 92 (L) 02/01/2019 0431   CO2 23 02/01/2019 0431   GLUCOSE 131 (H) 02/01/2019 0431   BUN 20 02/01/2019 0431   CREATININE 1.09 02/01/2019 0431   CALCIUM 8.7 (L) 02/01/2019 0431   PROT 6.5 01/30/2019 1204   ALBUMIN 3.8 01/30/2019 1204   AST 18 01/30/2019 1204   ALT 22 01/30/2019 1204   ALKPHOS 29 (L) 01/30/2019 1204   BILITOT 0.7 01/30/2019 1204   GFRNONAA >60 02/01/2019 0431   GFRAA >60 02/01/2019 0431    Assessment: 1.  Collagenous colitis: Controlled on budesonide 9 mg daily  Plan: 1.  Discussed with patient and his daughter that he can try decreasing to 6 mg/day.  This is 2 tabs daily.  She will call and let us know how he is doing over the next couple of weeks.  Explained that he  could possibly stop this medication altogether if it continues to cause constipation.  They verbalized understanding. 2.  Refilled Budesonide 3 mg, 2 tabs p.o.  daily #60 refill x5 3.  Patient to follow in clinic as needed with [place]  Ellouise Newer, PA-C [facility] 03/13/2019, 1:44 PM  Cc: Burnard Bunting, MD

## 2019-03-13 NOTE — Patient Instructions (Signed)
If you are age 82 or older, your body mass index should be between 23-30. Your Body mass index is 27.44 kg/m. If this is out of the aforementioned range listed, please consider follow up with your Primary Care Provider.  If you are age 76 or younger, your body mass index should be between 19-25. Your Body mass index is 27.44 kg/m. If this is out of the aformentioned range listed, please consider follow up with your Primary Care Provider.   We have sent the following medications to your pharmacy for you to pick up at your convenience: Budesonide  Call and let us know how your are doing.  Thank you for choosing me and [facility].    Ellouise Newer, PA-C

## 2019-04-07 ENCOUNTER — Other Ambulatory Visit: Payer: Self-pay

## 2019-04-07 ENCOUNTER — Encounter: Payer: Self-pay | Admitting: Cardiovascular Disease

## 2019-04-07 ENCOUNTER — Ambulatory Visit: Payer: Medicare HMO | Admitting: Cardiovascular Disease

## 2019-04-07 ENCOUNTER — Other Ambulatory Visit: Payer: Self-pay | Admitting: Cardiovascular Disease

## 2019-04-07 VITALS — BP 145/69 | HR 67 | Ht 70.5 in | Wt 177.6 lb

## 2019-04-07 DIAGNOSIS — Z794 Long term (current) use of insulin: Secondary | ICD-10-CM

## 2019-04-07 DIAGNOSIS — E785 Hyperlipidemia, unspecified: Secondary | ICD-10-CM | POA: Diagnosis not present

## 2019-04-07 DIAGNOSIS — I10 Essential (primary) hypertension: Secondary | ICD-10-CM

## 2019-04-07 DIAGNOSIS — E118 Type 2 diabetes mellitus with unspecified complications: Secondary | ICD-10-CM | POA: Diagnosis not present

## 2019-04-07 DIAGNOSIS — I2510 Atherosclerotic heart disease of native coronary artery without angina pectoris: Secondary | ICD-10-CM

## 2019-04-07 DIAGNOSIS — I442 Atrioventricular block, complete: Secondary | ICD-10-CM

## 2019-04-07 DIAGNOSIS — Z951 Presence of aortocoronary bypass graft: Secondary | ICD-10-CM | POA: Diagnosis not present

## 2019-04-07 NOTE — Patient Instructions (Signed)
Medication Instructions:  No changes *If you need a refill on your cardiac medications before your next appointment, please call your pharmacy*  Lab Work: None needed If you have labs (blood work) drawn today and your tests are completely normal, you will receive your results only by: [name] MyChart Message (if you have MyChart) OR . A paper copy in the mail If you have any lab test that is abnormal or we need to change your treatment, we will call you to review the results.  Testing/Procedures: None needed  Follow-Up: At [facility], you and your health needs are our priority.  As part of our continuing mission to provide you with exceptional heart care, we have created designated Provider Care Teams.  These Care Teams include your primary Cardiologist (physician) and Advanced Practice Providers (APPs -  Physician Assistants and Nurse Practitioners) who all work together to provide you with the care you need, when you need it.  Your next appointment:   6 month(s)  The format for your next appointment:   In Person  Provider:   Shelva Majestic, MD

## 2019-04-07 NOTE — Progress Notes (Signed)
Patient ID: MALIKYE REPPOND, male   DOB: 1934-12-28, 85 y.o.   MRN: [id number]    Primary M.D.: Dr. Burnard Bunting  HPI: CLEMENCE STILLINGS is a 85 y.o. male who presents for a 17 month cardiology evaluation.  Mr. Henkels has known CAD and on11/23/2005 underwent CABG surgery x3 by Dr. Servando Snare (LIMA to the LAD, vein to the intermediate, and into the distal circumflex). [name] was nondominant and not bypassed. Additional problems include type 2 diabetes mellitus, hypertension, as well as hyperlipidemia. His last nuclear perfusion study in 2012 which showed significant diaphragmatic attenuation but was without ischemia. Ejection fraction 65%. An echo Doppler study in November 2012 showed moderate concentric LVH with normal systolic function grade 1 diastolic dysfunction. He did have moderate LA dilatation, mild mitral annular calcification trace MR, trace TR, and aortic valve sclerosis.  Mr. Brumbaugh has continued to be stable from a cardiac standpoint. He denies recent episodes of chest pain. He remains active.  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  He also does all his own yard work.  He denies  palpitations. He denies presyncope or syncope.    He had undergone colonoscopy by Dr. Henrene Pastor and was found to have having diverticulitis.  He states his significant diarrhea markedly improved with a significant change in his diet.  When I saw him in March 2018 he had  undergone eye surgery by Dr. Valetta Close for a glaucoma and some bleeding issues.   I reviewed his complete set of blood work from 06/21/2016.  His labs were stable.  BUN was 18, creatinine 0.8.  Hemoglobin and hematocrit were 13.5 and 40.5, respectively.  TSH and PSA were normal.  Total cholesterol was 131, triglycerides 62, HDL 37, LDL was 82.   He had undergone a nuclear perfusion study in November 2018 which remained low risk and showed normal perfusion and function.  EF was 59%.  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  He underwent extensive surgery to the left side of his face/neck due to Merkel cell carcinoma.  He underwent 30 treatments of radiation to his lymph nodes.  He is unaware of palpitations, presyncope or syncope.  I  saw him in June 2019 at which time he remained active.  He was evaluated in the emergency room October 2019 after a fall when he tripped over a cushion falling onto a concrete floor.  He did not lose consciousness but did sustain some tears to CT of his head was unremarkable.  There was a minimally displaced nasal fracture.  Contusions on his face.  He denies any recent chest pain or palpitations.  He has continued to be on metoprolol 25 mg twice a day, amlodipine 5 mg daily and losartan 100 mg daily in addition to [facility] for hypertension.  He denies significant swelling.  He is on rosuvastatin for hyperlipidemia.  He is on glipizide and insulin for diabetes.    I last saw him in December 2019 at which time he remained stable with reference to his CAD and was without recurrent angina.  His ECG revealed stable first-degree heart block with sinus rhythm.  Since I last saw him, he apparently had a syncopal spell and presented with complete heart block in October 2020.  He underwent successful implantation of a Medtronic Micra leadless pacemaker by Dr. Rayann Heman after initially having undergone temporary pacemaker insertion.  An echo Doppler study on January 30, 2019 showed an EF of 50 to 55%.  Septal motion was abnormal but consistent with his RV pacemaker.  There was grade 1 diastolic dysfunction.  Mild to moderate mitral annular calcification.  Presently, he feels well.  He will be having an EP follow-up evaluation with Dr. Rayann Heman next month.  He denies anginal symptoms.  He denies any episodes of dizziness.  He has more energy.  He presents for evaluation.  Past Medical History:  Diagnosis Date  .  Abdominal hernia   . Asthma    [place] - HFA 50% 03/16/09 > 50% 05/13/10  . Collagenous colitis   . COPD (chronic obstructive pulmonary disease) ([place]) 2015  . Coronary artery disease 2005  . Diverticulosis   . GERD (gastroesophageal reflux disease)   . Glaucoma   . History of stress test 02/2011   Which showed mild inferior thinning felt to be diaphragmatic attenuation artifact without ichemia. Poststress EF was 65%.  [name] Hx of echocardiogram    EF>55% this showed moderate concentric LVH with normal systolic function. There was evidence for grade 1 diastolic relaxation abnormality with possible very small region of apical lateral hypokinesis.He did have moderate left atrial dilatation by volume assessment. There was mild mitral annular calcification with trace MR, trace TR, mild aortic valve sclerosis without stenosis    . Hyperlipidemia   . Hypertension   . Rhinitis medicamentosa    per remote [place] w/u  . S/P placement of cardiac pacemaker 01/31/19 leadless  02/01/2019  . Type II diabetes mellitus ([place])     Past Surgical History:  Procedure Laterality Date  . CARDIAC CATHETERIZATION  02/2004   Normal left ventricular function. Significant multivessel coronary artery disease corresponding to left main equivalent disease with diffuse tubular narrowing of 50% in the proximal left anterior descending, 905 in the ramus intermadius vessel and 80% in the most proximal circumflex veseel with 70% mid A-V groove circumflex stenosis.  [name] CATARACT EXTRACTION W/ INTRAOCULAR LENS IMPLANT Right 2003   . CORNEAL TRANSPLANT Right ~ 2004  . CORONARY ANGIOPLASTY    . CORONARY ARTERY BYPASS GRAFT     x3 By Dr Servando Snare with LIMA to the LAD, a vein to the intermediate, a vein to the distal circumflex. [name] was nondominant and not bypassed.  [name] GLAUCOMA VALVE INSERTION Right   . HAND SURGERY Right    "don't remember what was done"  . [place]   "not sure which side"  . KNEE  ARTHROSCOPY Bilateral   . PACEMAKER LEADLESS INSERTION N/A 01/31/2019   Procedure: PACEMAKER LEADLESS INSERTION;  Surgeon: Thompson Grayer, MD;  Location: [place] [facility];  Service: Cardiovascular;  Laterality: N/A;  . TEMPORARY PACEMAKER N/A 01/30/2019   Procedure: TEMPORARY PACEMAKER;  Surgeon: Burnell Blanks, MD;  Location: [place] [facility];  Service: Cardiovascular;  Laterality: N/A;    No Known Allergies  Current Outpatient Medications  Medication Sig Dispense Refill  . amLODipine (NORVASC) 5 MG tablet Take 5 mg by mouth daily.    [name] aspirin 81 MG tablet Take 81 mg by mouth daily.     . budesonide (ENTOCORT EC) 3 MG 24 hr capsule Take 2 capsules (6 mg total) by mouth daily. 60 capsule 5  . dorzolamide-timolol (COSOPT) 22.3-6.8 MG/ML ophthalmic solution Place 1 drop into the right eye 2 (two) times daily.    [name] glipiZIDE (GLUCOTROL) 5 MG tablet Take 5 mg by mouth 2 (two) times  daily before a meal.     . Insulin Detemir (LEVEMIR FLEXTOUCH) 100 UNIT/ML Pen Inject 19 Units into the skin daily before breakfast.     . losartan (COZAAR) 100 MG tablet Take 100 mg by mouth at bedtime.     . metFORMIN (GLUCOPHAGE-XR) 500 MG 24 hr tablet Take 2 tablets (1,000 mg total) by mouth daily with breakfast. (Patient taking differently: Take 500 mg by mouth daily with breakfast. )    . triamterene-hydrochlorothiazide (MAXZIDE-25) 37.5-25 MG tablet Take 1 tablet by mouth daily.    . metoprolol tartrate (LOPRESSOR) 25 MG tablet TAKE 1 TABLET TWICE A DAY 180 tablet 1  . rosuvastatin (CRESTOR) 20 MG tablet TAKE 1 TABLET DAILY 90 tablet 1   No current facility-administered medications for this visit.    Social History   Socioeconomic History  . Marital status: Married    Spouse name: Not on file  . Number of children: 3  . Years of education: Not on file  . Highest education level: Not on file  Occupational History  . Occupation: Retired  Tobacco Use  . Smoking status: Never Smoker  .  Smokeless tobacco: Current User    Types: Snuff  Substance and Sexual Activity  . Alcohol use: No    Alcohol/week: 0.0 standard drinks  . Drug use: No  . Sexual activity: Not Currently  Other Topics Concern  . Not on file  Social History Narrative   Never smoked cigs regularly but stopped all exposure 40s   Lives with wife   Social Determinants of Health   Financial Resource Strain:   . Difficulty of Paying Living Expenses: Not on file  Food Insecurity:   . Worried About Charity fundraiser in the Last Year: Not on file  . Ran Out of Food in the Last Year: Not on file  Transportation Needs:   . Lack of Transportation (Medical): Not on file  . Lack of Transportation (Non-Medical): Not on file  Physical Activity:   . Days of Exercise per Week: Not on file  . Minutes of Exercise per Session: Not on file  Stress:   . Feeling of Stress : Not on file  Social Connections:   . Frequency of Communication with Friends and Family: Not on file  . Frequency of Social Gatherings with Friends and Family: Not on file  . Attends Religious Services: Not on file  . Active Member of Clubs or Organizations: Not on file  . Attends Archivist Meetings: Not on file  . Marital Status: Not on file  Intimate Partner Violence:   . Fear of Current or Ex-Partner: Not on file  . Emotionally Abused: Not on file  . Physically Abused: Not on file  . Sexually Abused: Not on file    Family History  Problem Relation Age of Onset  . Asthma Mother   . Emphysema Father   . Colon cancer Neg Hx    Social history is normal and that he is married has 3 children 4 grandchildren. He does try to exercise and remains active. There is no tobacco or alcohol use.  ROS General: Negative; No fevers, chills, or night sweats;  HEENT: Bilateral hearing aids.,no sinus congestion, difficulty swallowing Pulmonary: Negative; No cough, wheezing, shortness of breath, hemoptysis Cardiovascular: See HPI GI: As if  her recent diarrhea and diverticular disease. GU: Negative; No dysuria, hematuria, or difficulty voiding Musculoskeletal: Negative; no myalgias, joint pain, or weakness Hematologic/Oncology: Merkel cell carcinoma Endocrine: Negative; no heat/cold  intolerance; no diabetes Neuro: Negative; no changes in balance, headaches Skin: History of Merkel cell carcinoma Psychiatric: Negative; No behavioral problems, depression Sleep: Negative; No snoring, daytime sleepiness, hypersomnolence, bruxism, restless legs, hypnogognic hallucinations, no cataplexy Other comprehensive 14 point system review is negative.  PE BP (!) 145/69   Pulse 67   Ht 5' 10.5" (1.791 m)   Wt 177 lb 9.6 oz (80.6 kg)   SpO2 100%   BMI 25.12 kg/m    Repeat blood pressure by me was 124/70  Wt Readings from Last 3 Encounters:  04/07/19 177 lb 9.6 oz (80.6 kg)  03/13/19 175 lb 3.2 oz (79.5 kg)  01/30/19 179 lb 14.3 oz (81.6 kg)   General: Alert, oriented, no distress.  Skin: normal turgor, no rashes, warm and dry HEENT: Normocephalic, atraumatic. Pupils equal round and reactive to light; sclera anicteric; extraocular muscles intact;  Nose without nasal septal hypertrophy Mouth/Parynx benign; Mallinpatti scale 2 Neck: No JVD, no carotid bruits; normal carotid upstroke Lungs: clear to ausculatation and percussion; no wheezing or rales Chest wall: without tenderness to palpitation Heart: PMI not displaced, RRR, s1 s2 normal, 1/6 systolic murmur, no diastolic murmur, no rubs, gallops, thrills, or heaves Abdomen: soft, nontender; no hepatosplenomehaly, BS+; abdominal aorta nontender and not dilated by palpation. Back: no CVA tenderness Pulses 2+ Musculoskeletal: full range of motion, normal strength, no joint deformities Extremities: no clubbing cyanosis or edema, Homan's sign negative  Neurologic: grossly nonfocal; Cranial nerves grossly wnl Psychologic: Normal mood and affect  ECG (independently read by me): Atrially  sensed ventricular paced rhythm at 67 bpm  March 20, 2018 ECG (independently read by me): Normal sinus rhythm at 68 bpm, left bundle branch block with repolarization changes.  First-degree AV block with a PR interval 238 ms.  June 2019 ECG (independently read by me): Normal sinus rhythm at 63 bpm.  First degree AV block.  June 19, 2016 ECG (independently read by me): Normal sinus rhythm at 64 bpm.  First-degree AV block with a PR interval at 24 ms.  Left axis deviation.  October 2017 ECG (independently read by me): Normal sinus rhythm with first-degree AV block.  Nonspecific ST changes.  Normal intervals.  March 2017 ECG (independently read by me): Sinus rhythm with first-degree AV block with a pulse of 68 bpm.  PR interval 212 ms.  May 2016 ECG (independently read by me): Normal sinus rhythm at 72 bpm.  Incomplete left bundle-branch block.  T-wave abnormality in aVL.  Normal intervals.  November 2015 ECG (independently read by me): Sinus rhythm with first-degree AV block with a heart rate at 80 bpm.  PR interval 228 ms.  T-wave changes in leads 1 and aVL.  Poor anterior R-wave progression.  Prior November 2014 ECG: Sinus rhythm with first-degree AV block. Poor progression anteroseptally. ST-T changes laterally, unchanged  LABS:  BMP Latest Ref Rng & Units 02/01/2019 01/31/2019 01/30/2019  Glucose 70 - 99 mg/dL 131(H) 122(H) 95  BUN 8 - 23 mg/dL _0 Creatinine 0.61 - 1.24 mg/dL 1.09 0.95 1.14  Sodium 135 - 145 mmol/L 127(L) 125(L) 126(L)  Potassium 3.5 - 5.1 mmol/L 3.9 4.1 4.5  Chloride 98 - 111 mmol/L 92(L) 93(L) 93(L)  CO2 22 - 32 mmol/L _1 Calcium 8.9 - 10.3 mg/dL 8.7(L) 8.9 9.0   Hepatic Function Latest Ref Rng & Units 01/30/2019 02/16/2015  Total Protein 6.5 - 8.1 g/dL 6.5 7.2  Albumin 3.5 - 5.0 g/dL 3.8 4.0  AST 15 -  41 U/L 18 15  ALT 0 - 44 U/L 22 12(L)  Alk Phosphatase 38 - 126 U/L 29(L) 44  Total Bilirubin 0.3 - 1.2 mg/dL 0.7 0.8   CBC Latest Ref Rng &  Units 01/31/2019 01/30/2019 02/17/2015  WBC 4.0 - 10.5 K/uL 6.2 6.6 4.6  Hemoglobin 13.0 - 17.0 g/dL 12.3(L) 12.4(L) 12.4(L)  Hematocrit 39.0 - 52.0 % 35.5(L) 35.7(L) 34.0(L)  Platelets 150 - 400 K/uL 206 198 236   Lab Results  Component Value Date   MCV 92.4 01/31/2019   MCV 92.7 01/30/2019   MCV 87.6 02/17/2015    Lab Results  Component Value Date   TSH 2.958 01/30/2019    Lab Results  Component Value Date   HGBA1C 6.2 (H) 02/17/2015    RADIOLOGY: No results found.  IMPRESSION:  1. CAD in native artery   2. Hx of CABG   3. Essential hypertension   4. Heart block AV complete [facility]): s/p Medtronic leadless pacemaker   5. Hyperlipidemia with target LDL less than 70   6. Type 2 diabetes mellitus with complication, with long-term current use of insulin [facility])     ASSESSMENT AND PLAN: Mr. Jahkari Maclin is an 85 year old white male who is status post CABG surgery x3 in November 2005. His  stress test  in 2012 revealed mild inferior thinning felt to be due to diaphragmatic attenuation without ischemia.  His last stress study from February 20, 2017 continued to be low risk and showed normal perfusion with normal left ventricular regional and global systolic function.  EF was 59%.  He continues to be stable without recurrent anginal symptomatology.  His blood pressure today is well controlled on amlodipine 5 mg, losartan 100 mg daily and metoprolol 25 mg twice a day in addition to triamterene/HCTZ 37.5/25 mg daily.  In October, he had developed 2 syncopal spells and was found to have complete heart block.  He underwent initial temporary pacemaker insertion and subsequently underwent permanent pacemaker insertion by Dr. Rayann Heman with a Medtronic leadless pacemaker.  His echo Doppler study at that time showed EF estimated 50 to 55% with grade 1 diastolic dysfunction.  He is diabetic on metformin and glipizide.  He continues to be on rosuvastatin for hyperlipidemia with target LDL less than 70.   On December 02, 2018 lipid studies were excellent with total cholesterol 102, triglycerides 73, and LDL cholesterol 53.  He has difficulty hearing and uses hearing aids.  He will be seeing Dr. Rayann Heman for pacemaker follow-up in February.  I will see him in 6 months for follow-up Cardiologic evaluation.   Time spent: 30 minutes Troy Sine, MD, [facility]  04/09/2019 12:39 PM

## 2019-04-09 ENCOUNTER — Encounter: Payer: Self-pay | Admitting: Cardiovascular Disease

## 2019-04-22 DIAGNOSIS — H401113 Primary open-angle glaucoma, right eye, severe stage: Secondary | ICD-10-CM | POA: Diagnosis not present

## 2019-04-22 DIAGNOSIS — E119 Type 2 diabetes mellitus without complications: Secondary | ICD-10-CM | POA: Diagnosis not present

## 2019-04-22 DIAGNOSIS — H5203 Hypermetropia, bilateral: Secondary | ICD-10-CM | POA: Diagnosis not present

## 2019-04-29 DIAGNOSIS — H903 Sensorineural hearing loss, bilateral: Secondary | ICD-10-CM | POA: Diagnosis not present

## 2019-04-29 DIAGNOSIS — H6121 Impacted cerumen, right ear: Secondary | ICD-10-CM | POA: Diagnosis not present

## 2019-05-05 ENCOUNTER — Ambulatory Visit (INDEPENDENT_AMBULATORY_CARE_PROVIDER_SITE_OTHER): Payer: Medicare HMO | Admitting: *Deleted

## 2019-05-05 DIAGNOSIS — I442 Atrioventricular block, complete: Secondary | ICD-10-CM

## 2019-05-05 LAB — CUP PACEART REMOTE DEVICE CHECK
Battery Remaining Longevity: 96 mo
Battery Voltage: 3.11 V
Brady Statistic AS VP Percent: 92.18 %
Brady Statistic AS VS Percent: 0.06 %
Brady Statistic RV Percent Paced: 96.72 %
Date Time Interrogation Session: 20210201124246
Implantable Pulse Generator Implant Date: 20201030
Lead Channel Impedance Value: 680 Ohm
Lead Channel Pacing Threshold Amplitude: 0.375 V
Lead Channel Pacing Threshold Pulse Width: 0.24 ms
Lead Channel Sensing Intrinsic Amplitude: 20.475 mV
Lead Channel Setting Pacing Amplitude: 2 V
Lead Channel Setting Pacing Pulse Width: 0.24 ms
Lead Channel Setting Sensing Sensitivity: 2 mV

## 2019-05-06 NOTE — Progress Notes (Signed)
PPM Remote  

## 2019-05-07 ENCOUNTER — Encounter: Payer: Self-pay | Admitting: Internal Medicine

## 2019-05-07 ENCOUNTER — Telehealth (INDEPENDENT_AMBULATORY_CARE_PROVIDER_SITE_OTHER): Payer: Medicare HMO | Admitting: Internal Medicine

## 2019-05-07 ENCOUNTER — Other Ambulatory Visit: Payer: Self-pay

## 2019-05-07 VITALS — BP 136/72 | HR 69 | Ht 70.5 in | Wt 170.8 lb

## 2019-05-07 DIAGNOSIS — Z1331 Encounter for screening for depression: Secondary | ICD-10-CM | POA: Diagnosis not present

## 2019-05-07 DIAGNOSIS — J449 Chronic obstructive pulmonary disease, unspecified: Secondary | ICD-10-CM | POA: Diagnosis not present

## 2019-05-07 DIAGNOSIS — R413 Other amnesia: Secondary | ICD-10-CM | POA: Diagnosis not present

## 2019-05-07 DIAGNOSIS — Z950 Presence of cardiac pacemaker: Secondary | ICD-10-CM | POA: Diagnosis not present

## 2019-05-07 DIAGNOSIS — I10 Essential (primary) hypertension: Secondary | ICD-10-CM | POA: Diagnosis not present

## 2019-05-07 DIAGNOSIS — I442 Atrioventricular block, complete: Secondary | ICD-10-CM

## 2019-05-07 DIAGNOSIS — I2510 Atherosclerotic heart disease of native coronary artery without angina pectoris: Secondary | ICD-10-CM | POA: Diagnosis not present

## 2019-05-07 DIAGNOSIS — E1169 Type 2 diabetes mellitus with other specified complication: Secondary | ICD-10-CM | POA: Diagnosis not present

## 2019-05-07 DIAGNOSIS — E785 Hyperlipidemia, unspecified: Secondary | ICD-10-CM | POA: Diagnosis not present

## 2019-05-07 DIAGNOSIS — C4A30 Merkel cell carcinoma of unspecified part of face: Secondary | ICD-10-CM | POA: Diagnosis not present

## 2019-05-07 NOTE — Progress Notes (Signed)
Electrophysiology TeleHealth Note  Due to national recommendations of social distancing due to [place] 19, an audio telehealth visit is felt to be most appropriate for this patient at this time.  Verbal consent was obtained by me for the telehealth visit today.  The patient does not have capability for a virtual visit.  A phone visit is therefore required today.   Date:  05/07/2019   ID:  Nathan Howell, DOB 20-Jul-1934, MRN [id number]  Location: patient's home  Provider location:  [place]  Evaluation Performed: Follow-up visit  PCP:  Burnard Bunting, MD   Electrophysiologist:  Dr Rayann Heman  Chief Complaint:  CHB  History of Present Illness:    Nathan Howell is a 85 y.o. male who presents via telehealth conferencing today.  Since his pacemaker implant, the patient reports doing very well.  He states "II am doing great!" Today, he denies symptoms of palpitations, chest pain, shortness of breath,  lower extremity edema, dizziness, presyncope, or syncope.  He has had some difficulty with his memory recently.  The patient is otherwise without complaint today.  The patient denies symptoms of fevers, chills, cough, or new SOB worrisome for COVID 19.  Past Medical History:  Diagnosis Date  . Abdominal hernia   . Asthma    [place] - HFA 50% 03/16/09 > 50% 05/13/10  . Collagenous colitis   . COPD (chronic obstructive pulmonary disease) ([place]) 2015  . Coronary artery disease 2005  . Diverticulosis   . GERD (gastroesophageal reflux disease)   . Glaucoma   . History of stress test 02/2011   Which showed mild inferior thinning felt to be diaphragmatic attenuation artifact without ichemia. Poststress EF was 65%.  [name] Hx of echocardiogram    EF>55% this showed moderate concentric LVH with normal systolic function. There was evidence for grade 1 diastolic relaxation abnormality with possible very small region of apical lateral hypokinesis.He did have moderate left atrial dilatation by volume  assessment. There was mild mitral annular calcification with trace MR, trace TR, mild aortic valve sclerosis without stenosis    . Hyperlipidemia   . Hypertension   . Rhinitis medicamentosa    per remote [place] w/u  . S/P placement of cardiac pacemaker 01/31/19 leadless  02/01/2019  . Type II diabetes mellitus ([place])     Past Surgical History:  Procedure Laterality Date  . CARDIAC CATHETERIZATION  02/2004   Normal left ventricular function. Significant multivessel coronary artery disease corresponding to left main equivalent disease with diffuse tubular narrowing of 50% in the proximal left anterior descending, 905 in the ramus intermadius vessel and 80% in the most proximal circumflex veseel with 70% mid A-V groove circumflex stenosis.  [name] CATARACT EXTRACTION W/ INTRAOCULAR LENS IMPLANT Right 2003   . CORNEAL TRANSPLANT Right ~ 2004  . CORONARY ANGIOPLASTY    . CORONARY ARTERY BYPASS GRAFT     x3 By Dr Servando Snare with LIMA to the LAD, a vein to the intermediate, a vein to the distal circumflex. [name] was nondominant and not bypassed.  [name] GLAUCOMA VALVE INSERTION Right   . HAND SURGERY Right    "don't remember what was done"  . [place]   "not sure which side"  . KNEE ARTHROSCOPY Bilateral   . PACEMAKER LEADLESS INSERTION N/A 01/31/2019   Procedure: PACEMAKER LEADLESS INSERTION;  Surgeon: Thompson Grayer, MD;  Location: [place] [facility];  Service: Cardiovascular;  Laterality: N/A;  . TEMPORARY PACEMAKER N/A 01/30/2019  Procedure: TEMPORARY PACEMAKER;  Surgeon: Burnell Blanks, MD;  Location: [place] [facility];  Service: Cardiovascular;  Laterality: N/A;    Current Outpatient Medications  Medication Sig Dispense Refill  . amLODipine (NORVASC) 5 MG tablet Take 5 mg by mouth daily.    [name] aspirin 81 MG tablet Take 81 mg by mouth daily.     . budesonide (ENTOCORT EC) 3 MG 24 hr capsule Take 2 capsules (6 mg total) by mouth daily. 60 capsule 5  .  dorzolamide-timolol (COSOPT) 22.3-6.8 MG/ML ophthalmic solution Place 1 drop into the right eye 2 (two) times daily.    [name] glipiZIDE (GLUCOTROL) 5 MG tablet Take 5 mg by mouth 2 (two) times daily before a meal.     . Insulin Detemir (LEVEMIR FLEXTOUCH) 100 UNIT/ML Pen Inject 19 Units into the skin daily before breakfast.     . losartan (COZAAR) 100 MG tablet Take 100 mg by mouth at bedtime.     . metFORMIN (GLUCOPHAGE-XR) 500 MG 24 hr tablet Take 2 tablets (1,000 mg total) by mouth daily with breakfast.    . metoprolol tartrate (LOPRESSOR) 25 MG tablet TAKE 1 TABLET TWICE A DAY 180 tablet 1  . rosuvastatin (CRESTOR) 20 MG tablet TAKE 1 TABLET DAILY 90 tablet 1  . triamterene-hydrochlorothiazide (MAXZIDE-25) 37.5-25 MG tablet Take 1 tablet by mouth daily.     No current facility-administered medications for this visit.    Allergies:   Patient has no known allergies.   Social History:  The patient  reports that he has never smoked. His smokeless tobacco use includes snuff. He reports that he does not drink alcohol or use drugs.   Family History:  The patient's family history includes Asthma in his mother; Emphysema in his father.   ROS:  Please see the history of present illness.   All other systems are personally reviewed and negative.    Exam:    Vital Signs:  BP 136/72   Pulse 69   Ht 5' 10.5" (1.791 m)   Wt 170 lb 12.8 oz (77.5 kg)   BMI 24.16 kg/m   Well sounding, alert and conversant   Labs/Other Tests and Data Reviewed:    Recent Labs: 01/30/2019: ALT 22; B Natriuretic Peptide 625.9; TSH 2.958 01/31/2019: Hemoglobin 12.3; Platelets 206 02/01/2019: BUN 20; Creatinine, Ser 1.09; Potassium 3.9; Sodium 127   Wt Readings from Last 3 Encounters:  05/07/19 170 lb 12.8 oz (77.5 kg)  04/07/19 177 lb 9.6 oz (80.6 kg)  03/13/19 175 lb 3.2 oz (79.5 kg)     Last device remote is reviewed from [place] PDF which reveals normal device function, no arrhythmias    ASSESSMENT & PLAN:     1.  Complete heart block Remotes are up to date Normal device function  V paces 97% Histograms look again  2. CAD s/p CABG No ischemic symptoms No changes  3. HTN Stable No change required today    Follow-up:   with me in a year   Patient Risk:  after full review of this patients clinical status, I feel that they are at moderate risk at this time.  Today, I have spent 15 minutes with the patient with telehealth technology discussing arrhythmia management .    Army Fossa, MD  05/07/2019 11:47 AM     [facility] [address] [place] [address] [phone] (office) [phone] (fax)

## 2019-05-08 DIAGNOSIS — E1169 Type 2 diabetes mellitus with other specified complication: Secondary | ICD-10-CM | POA: Diagnosis not present

## 2019-05-08 DIAGNOSIS — H6041 Cholesteatoma of right external ear: Secondary | ICD-10-CM | POA: Diagnosis not present

## 2019-05-08 DIAGNOSIS — H6121 Impacted cerumen, right ear: Secondary | ICD-10-CM | POA: Diagnosis not present

## 2019-05-08 DIAGNOSIS — H906 Mixed conductive and sensorineural hearing loss, bilateral: Secondary | ICD-10-CM | POA: Diagnosis not present

## 2019-05-21 DIAGNOSIS — C44329 Squamous cell carcinoma of skin of other parts of face: Secondary | ICD-10-CM | POA: Diagnosis not present

## 2019-05-21 DIAGNOSIS — X32XXXD Exposure to sunlight, subsequent encounter: Secondary | ICD-10-CM | POA: Diagnosis not present

## 2019-05-21 DIAGNOSIS — L570 Actinic keratosis: Secondary | ICD-10-CM | POA: Diagnosis not present

## 2019-06-06 ENCOUNTER — Other Ambulatory Visit: Payer: Self-pay

## 2019-06-06 ENCOUNTER — Emergency Department (HOSPITAL_BASED_OUTPATIENT_CLINIC_OR_DEPARTMENT_OTHER): Payer: Medicare HMO

## 2019-06-06 ENCOUNTER — Encounter (HOSPITAL_BASED_OUTPATIENT_CLINIC_OR_DEPARTMENT_OTHER): Payer: Self-pay | Admitting: *Deleted

## 2019-06-06 ENCOUNTER — Emergency Department (HOSPITAL_BASED_OUTPATIENT_CLINIC_OR_DEPARTMENT_OTHER)
Admission: EM | Admit: 2019-06-06 | Discharge: 2019-06-06 | Disposition: A | Discharge status: Home / Self Care | Payer: Medicare HMO | Attending: Emergency Medicine | Admitting: Emergency Medicine

## 2019-06-06 DIAGNOSIS — J449 Chronic obstructive pulmonary disease, unspecified: Secondary | ICD-10-CM | POA: Diagnosis not present

## 2019-06-06 DIAGNOSIS — Z9861 Coronary angioplasty status: Secondary | ICD-10-CM | POA: Insufficient documentation

## 2019-06-06 DIAGNOSIS — E119 Type 2 diabetes mellitus without complications: Secondary | ICD-10-CM | POA: Diagnosis not present

## 2019-06-06 DIAGNOSIS — L989 Disorder of the skin and subcutaneous tissue, unspecified: Secondary | ICD-10-CM | POA: Diagnosis present

## 2019-06-06 DIAGNOSIS — L02611 Cutaneous abscess of right foot: Secondary | ICD-10-CM | POA: Insufficient documentation

## 2019-06-06 DIAGNOSIS — I10 Essential (primary) hypertension: Secondary | ICD-10-CM | POA: Diagnosis not present

## 2019-06-06 DIAGNOSIS — Z87891 Personal history of nicotine dependence: Secondary | ICD-10-CM | POA: Diagnosis not present

## 2019-06-06 DIAGNOSIS — I2510 Atherosclerotic heart disease of native coronary artery without angina pectoris: Secondary | ICD-10-CM | POA: Insufficient documentation

## 2019-06-06 DIAGNOSIS — Z950 Presence of cardiac pacemaker: Secondary | ICD-10-CM | POA: Insufficient documentation

## 2019-06-06 DIAGNOSIS — M19071 Primary osteoarthritis, right ankle and foot: Secondary | ICD-10-CM | POA: Diagnosis not present

## 2019-06-06 DIAGNOSIS — Z794 Long term (current) use of insulin: Secondary | ICD-10-CM | POA: Diagnosis not present

## 2019-06-06 DIAGNOSIS — Z951 Presence of aortocoronary bypass graft: Secondary | ICD-10-CM | POA: Diagnosis not present

## 2019-06-06 DIAGNOSIS — E785 Hyperlipidemia, unspecified: Secondary | ICD-10-CM | POA: Insufficient documentation

## 2019-06-06 DIAGNOSIS — L0291 Cutaneous abscess, unspecified: Secondary | ICD-10-CM

## 2019-06-06 DIAGNOSIS — Z79899 Other long term (current) drug therapy: Secondary | ICD-10-CM | POA: Diagnosis not present

## 2019-06-06 DIAGNOSIS — M7731 Calcaneal spur, right foot: Secondary | ICD-10-CM | POA: Diagnosis not present

## 2019-06-06 MED ORDER — DOXYCYCLINE HYCLATE 100 MG PO TABS
100.0000 mg | ORAL_TABLET | Freq: Once | ORAL | Status: AC
  Administered 2019-06-06: 100 mg via ORAL
  Filled 2019-06-06: qty 1

## 2019-06-06 MED ORDER — DOXYCYCLINE HYCLATE 100 MG PO CAPS: 100.0000 mg | ORAL_CAPSULE | Freq: Two times a day (BID) | ORAL | 0 refills | Status: DC

## 2019-06-06 MED ORDER — LIDOCAINE HCL (PF) 1 % IJ SOLN
5.0000 mL | Freq: Once | INTRAMUSCULAR | Status: AC
  Administered 2019-06-06: 5 mL
  Filled 2019-06-06: qty 5

## 2019-06-06 NOTE — ED Provider Notes (Signed)
[facility] EMERGENCY DEPARTMENT Provider Note   CSN: [id number] Arrival date & time: 06/06/19  1212     History Chief Complaint  Patient presents with  . Toe Pain    Nathan Howell is a 85 y.o. male.  Pt presents to the ED today with a nonhealing area to his right lateral foot.  Pt said he hit his foot on something about a month ago and may have gotten a splinter in it.  He is concerned it is now getting infected.        Past Medical History:  Diagnosis Date  . Abdominal hernia   . Asthma    [place] - HFA 50% 03/16/09 > 50% 05/13/10  . Collagenous colitis   . COPD (chronic obstructive pulmonary disease) ([place]) 2015  . Coronary artery disease 2005  . Diverticulosis   . GERD (gastroesophageal reflux disease)   . Glaucoma   . History of stress test 02/2011   Which showed mild inferior thinning felt to be diaphragmatic attenuation artifact without ichemia. Poststress EF was 65%.  [name] Hx of echocardiogram    EF>55% this showed moderate concentric LVH with normal systolic function. There was evidence for grade 1 diastolic relaxation abnormality with possible very small region of apical lateral hypokinesis.He did have moderate left atrial dilatation by volume assessment. There was mild mitral annular calcification with trace MR, trace TR, mild aortic valve sclerosis without stenosis    . Hyperlipidemia   . Hypertension   . Rhinitis medicamentosa    per remote [place] w/u  . S/P placement of cardiac pacemaker 01/31/19 leadless  02/01/2019  . Type II diabetes mellitus [facility])     Patient Active Problem List   Diagnosis Date Noted  . S/P placement of cardiac pacemaker 01/31/19 leadless MDT 02/01/2019  . Malnutrition of moderate degree 01/31/2019  . Symptomatic bradycardia   . Complete heart block ([place]) 01/30/2019  . Hyponatremia 02/16/2015  . Generalized weakness 02/16/2015  . Headache 02/16/2015  . Asthma 02/16/2015  . COPD (chronic obstructive pulmonary disease)  ([place]) 02/16/2015  . Glaucoma 02/16/2015  . Diabetes mellitus without complication ([place])   . CAD (coronary artery disease) 02/04/2013  . HTN (hypertension) 02/04/2013  . Hyperlipidemia with target LDL less than 70 02/04/2013  . Cough 05/06/2010  . DIABETES MELLITUS, TYPE II 11/03/2009  . WEIGHT LOSS-ABNORMAL 11/03/2009  . Diarrhea 11/03/2009  . ALLERGIC RHINITIS 03/01/2009  . Cough variant asthma 03/01/2009    Past Surgical History:  Procedure Laterality Date  . CARDIAC CATHETERIZATION  02/2004   Normal left ventricular function. Significant multivessel coronary artery disease corresponding to left main equivalent disease with diffuse tubular narrowing of 50% in the proximal left anterior descending, 905 in the ramus intermadius vessel and 80% in the most proximal circumflex veseel with 70% mid A-V groove circumflex stenosis.  [name] CATARACT EXTRACTION W/ INTRAOCULAR LENS IMPLANT Right 2003   . CORNEAL TRANSPLANT Right ~ 2004  . CORONARY ANGIOPLASTY    . CORONARY ARTERY BYPASS GRAFT     x3 By Dr Servando Snare with LIMA to the LAD, a vein to the intermediate, a vein to the distal circumflex. [name] was nondominant and not bypassed.  [name] GLAUCOMA VALVE INSERTION Right   . HAND SURGERY Right    "don't remember what was done"  . [place]   "not sure which side"  . KNEE ARTHROSCOPY Bilateral   . PACEMAKER LEADLESS INSERTION N/A 01/31/2019   Procedure: PACEMAKER LEADLESS INSERTION;  Surgeon:  Thompson Grayer, MD;  Location: [facility] CV LAB;  Service: Cardiovascular;  Laterality: N/A;  . TEMPORARY PACEMAKER N/A 01/30/2019   Procedure: TEMPORARY PACEMAKER;  Surgeon: Burnell Blanks, MD;  Location: [facility];  Service: Cardiovascular;  Laterality: N/A;       Family History  Problem Relation Age of Onset  . Asthma Mother   . Emphysema Father   . Colon cancer Neg Hx     Social History   Tobacco Use  . Smoking status: Never Smoker  . Smokeless tobacco:  Current User    Types: Snuff  Substance Use Topics  . Alcohol use: No    Alcohol/week: 0.0 standard drinks  . Drug use: No    Home Medications Prior to Admission medications   Medication Sig Start Date End Date Taking? Authorizing Provider  amLODipine (NORVASC) 5 MG tablet Take 5 mg by mouth daily.    [provider]  aspirin 81 MG tablet Take 81 mg by mouth daily.  11/18/09   [provider]  budesonide (ENTOCORT EC) 3 MG 24 hr capsule Take 2 capsules (6 mg total) by mouth daily. 03/13/19   Levin Erp, PA  dorzolamide-timolol (COSOPT) 22.3-6.8 MG/ML ophthalmic solution Place 1 drop into the right eye 2 (two) times daily.    [provider]  doxycycline (VIBRAMYCIN) 100 MG capsule Take 1 capsule (100 mg total) by mouth 2 (two) times daily. 06/06/19   Isla Pence, MD  glipiZIDE (GLUCOTROL) 5 MG tablet Take 5 mg by mouth 2 (two) times daily before a meal.     [provider]  Insulin Detemir (LEVEMIR FLEXTOUCH) 100 UNIT/ML Pen Inject 19 Units into the skin daily before breakfast.     [provider]  losartan (COZAAR) 100 MG tablet Take 100 mg by mouth at bedtime.     [provider]  metFORMIN (GLUCOPHAGE-XR) 500 MG 24 hr tablet Take 2 tablets (1,000 mg total) by mouth daily with breakfast. 02/03/19   Isaiah Serge, NP  metoprolol tartrate (LOPRESSOR) 25 MG tablet TAKE 1 TABLET TWICE A DAY 04/08/19   Troy Sine, MD  rosuvastatin (CRESTOR) 20 MG tablet TAKE 1 TABLET DAILY 04/08/19   Troy Sine, MD  triamterene-hydrochlorothiazide (MAXZIDE-25) 37.5-25 MG tablet Take 1 tablet by mouth daily.    [provider]    Allergies    Patient has no known allergies.  Review of Systems   Review of Systems  Musculoskeletal:       Right lateral foot pain  All other systems reviewed and are negative.   Physical Exam Updated Vital Signs BP 135/75   Pulse 61   Temp 97.7 F (36.5 C) (Oral)   Resp 20   Ht 5' 11.5"  (1.816 m)   Wt 78 kg   SpO2 100%   BMI 23.65 kg/m   Physical Exam Vitals and nursing note reviewed.  Constitutional:      Appearance: Normal appearance.  HENT:     Head: Normocephalic and atraumatic.     Right Ear: External ear normal.     Left Ear: External ear normal.     Nose: Nose normal.     Mouth/Throat:     Mouth: Mucous membranes are moist.     Pharynx: Oropharynx is clear.  Eyes:     Extraocular Movements: Extraocular movements intact.     Conjunctiva/sclera: Conjunctivae normal.     Pupils: Pupils are equal, round, and reactive to light.  Cardiovascular:  Rate and Rhythm: Normal rate and regular rhythm.     Pulses: Normal pulses.     Heart sounds: Normal heart sounds.  Pulmonary:     Effort: Pulmonary effort is normal.     Breath sounds: Normal breath sounds.  Abdominal:     General: Abdomen is flat. Bowel sounds are normal.     Palpations: Abdomen is soft.  Musculoskeletal:        General: Normal range of motion.     Cervical back: Normal range of motion and neck supple.  Skin:    Capillary Refill: Capillary refill takes less than 2 seconds.     Comments: Small abscess right lateral foot  Neurological:     Mental Status: He is alert.     ED Results / Procedures / Treatments   Labs (all labs ordered are listed, but only abnormal results are displayed) Labs Reviewed - No data to display  EKG None  Radiology DG Foot Complete Right  Result Date: 06/06/2019 CLINICAL DATA:  Pain at the base of the little toe for 1 month. No known injury. EXAM: RIGHT FOOT COMPLETE - 3+ VIEW COMPARISON:  None. FINDINGS: There is no acute bony or joint abnormality. The fifth ray appears normal. The patient has advanced first MTP osteoarthritis with bone-on-bone joint space narrowing and bulky osteophytosis. Calcaneal spurring is seen. Atherosclerotic calcifications noted. IMPRESSION: No acute abnormality or finding to explain little toe pain. Advanced first MTP  osteoarthritis. Calcaneal spurring. Atherosclerosis. Electronically Signed   By: Inge Rise M.D.   On: 06/06/2019 13:00    Procedures .Marland KitchenIncision and Drainage  Date/Time: 06/06/2019 1:16 PM Performed by: Isla Pence, MD Authorized by: Isla Pence, MD   Consent:    Consent obtained:  Verbal   Consent given by:  Patient   Risks discussed:  Bleeding and incomplete drainage Location:    Type:  Abscess   Size:  1   Location:  Lower extremity   Lower extremity location:  Foot   Foot location:  R foot Pre-procedure details:    Skin preparation:  Betadine Anesthesia (see MAR for exact dosages):    Anesthesia method:  Local infiltration   Local anesthetic:  Lidocaine 1% w/o epi Procedure type:    Complexity:  Simple Procedure details:    Incision types:  Single straight   Scalpel blade:  11   Wound management:  Probed and deloculated   Drainage:  Purulent   Drainage amount:  Scant   Wound treatment:  Wound left open   Packing materials:  None Post-procedure details:    Patient tolerance of procedure:  Tolerated well, no immediate complications Comments:     Pt was unsure if there was a splinter in abscess.  I did not feel or see one.   (including critical care time)  Medications Ordered in ED Medications  lidocaine (PF) (XYLOCAINE) 1 % injection 5 mL (has no administration in time range)  doxycycline (VIBRA-TABS) tablet 100 mg (has no administration in time range)    ED Course  I have reviewed the triage vital signs and the nursing notes.  Pertinent labs & imaging results that were available during my care of the patient were reviewed by me and considered in my medical decision making (see chart for details).    MDM Rules/Calculators/A&P                     Pt will be started on doxy and told to return if worse.  F/u  with pcp.  Final Clinical Impression(s) / ED Diagnoses Final diagnoses:  Abscess    Rx / DC Orders ED Discharge Orders         Ordered     doxycycline (VIBRAMYCIN) 100 MG capsule  2 times daily     06/06/19 1316           Isla Pence, MD 06/06/19 1318

## 2019-06-06 NOTE — ED Triage Notes (Signed)
A month ago he was walking at night and hit his right little toe on an unknown object. It is not healing. He thinks he has a splinter in his toe.

## 2019-07-02 DIAGNOSIS — Z85828 Personal history of other malignant neoplasm of skin: Secondary | ICD-10-CM | POA: Diagnosis not present

## 2019-07-02 DIAGNOSIS — L570 Actinic keratosis: Secondary | ICD-10-CM | POA: Diagnosis not present

## 2019-07-02 DIAGNOSIS — X32XXXD Exposure to sunlight, subsequent encounter: Secondary | ICD-10-CM | POA: Diagnosis not present

## 2019-07-02 DIAGNOSIS — Z85821 Personal history of Merkel cell carcinoma: Secondary | ICD-10-CM | POA: Diagnosis not present

## 2019-07-02 DIAGNOSIS — Z1283 Encounter for screening for malignant neoplasm of skin: Secondary | ICD-10-CM | POA: Diagnosis not present

## 2019-07-02 DIAGNOSIS — Z08 Encounter for follow-up examination after completed treatment for malignant neoplasm: Secondary | ICD-10-CM | POA: Diagnosis not present

## 2019-08-04 ENCOUNTER — Ambulatory Visit (INDEPENDENT_AMBULATORY_CARE_PROVIDER_SITE_OTHER): Payer: Medicare HMO | Admitting: *Deleted

## 2019-08-04 DIAGNOSIS — I442 Atrioventricular block, complete: Secondary | ICD-10-CM | POA: Diagnosis not present

## 2019-08-04 LAB — CUP PACEART REMOTE DEVICE CHECK
Battery Remaining Longevity: 96 mo
Battery Voltage: 3.06 V
Brady Statistic AS VP Percent: 90.98 %
Brady Statistic AS VS Percent: 0.03 %
Brady Statistic RV Percent Paced: 98.27 %
Date Time Interrogation Session: 20210503121446
Implantable Pulse Generator Implant Date: 20201030
Lead Channel Impedance Value: 730 Ohm
Lead Channel Pacing Threshold Amplitude: 0.375 V
Lead Channel Pacing Threshold Pulse Width: 0.24 ms
Lead Channel Sensing Intrinsic Amplitude: 20.475 mV
Lead Channel Setting Pacing Amplitude: 1 V
Lead Channel Setting Pacing Pulse Width: 0.24 ms
Lead Channel Setting Sensing Sensitivity: 2 mV

## 2019-08-05 NOTE — Progress Notes (Signed)
Remote pacemaker transmission.   

## 2019-08-07 DIAGNOSIS — H903 Sensorineural hearing loss, bilateral: Secondary | ICD-10-CM | POA: Diagnosis not present

## 2019-08-07 DIAGNOSIS — H7011 Chronic mastoiditis, right ear: Secondary | ICD-10-CM | POA: Diagnosis not present

## 2019-08-08 DIAGNOSIS — H04121 Dry eye syndrome of right lacrimal gland: Secondary | ICD-10-CM | POA: Diagnosis not present

## 2019-08-08 DIAGNOSIS — H04122 Dry eye syndrome of left lacrimal gland: Secondary | ICD-10-CM | POA: Diagnosis not present

## 2019-08-22 ENCOUNTER — Other Ambulatory Visit: Payer: Self-pay | Admitting: Cardiovascular Disease

## 2019-09-03 DIAGNOSIS — I10 Essential (primary) hypertension: Secondary | ICD-10-CM | POA: Diagnosis not present

## 2019-09-03 DIAGNOSIS — E1169 Type 2 diabetes mellitus with other specified complication: Secondary | ICD-10-CM | POA: Diagnosis not present

## 2019-09-03 DIAGNOSIS — J449 Chronic obstructive pulmonary disease, unspecified: Secondary | ICD-10-CM | POA: Diagnosis not present

## 2019-09-03 DIAGNOSIS — R413 Other amnesia: Secondary | ICD-10-CM | POA: Diagnosis not present

## 2019-09-19 DIAGNOSIS — H02051 Trichiasis without entropian right upper eyelid: Secondary | ICD-10-CM | POA: Diagnosis not present

## 2019-09-19 DIAGNOSIS — H04121 Dry eye syndrome of right lacrimal gland: Secondary | ICD-10-CM | POA: Diagnosis not present

## 2019-10-01 ENCOUNTER — Other Ambulatory Visit: Payer: Self-pay | Admitting: Cardiovascular Disease

## 2019-10-13 ENCOUNTER — Other Ambulatory Visit: Payer: Self-pay | Admitting: Physician Assistant

## 2019-11-03 ENCOUNTER — Ambulatory Visit (INDEPENDENT_AMBULATORY_CARE_PROVIDER_SITE_OTHER): Payer: Medicare HMO | Admitting: *Deleted

## 2019-11-03 DIAGNOSIS — I442 Atrioventricular block, complete: Secondary | ICD-10-CM | POA: Diagnosis not present

## 2019-11-04 LAB — CUP PACEART REMOTE DEVICE CHECK
Battery Remaining Longevity: 96 mo
Battery Voltage: 3.04 V
Brady Statistic AS VP Percent: 90.13 %
Brady Statistic AS VS Percent: 0.03 %
Brady Statistic RV Percent Paced: 98.59 %
Date Time Interrogation Session: 20210802120146
Implantable Pulse Generator Implant Date: 20201030
Lead Channel Impedance Value: 770 Ohm
Lead Channel Pacing Threshold Amplitude: 0.375 V
Lead Channel Pacing Threshold Pulse Width: 0.24 ms
Lead Channel Sensing Intrinsic Amplitude: 23.963 mV
Lead Channel Setting Pacing Amplitude: 0.875
Lead Channel Setting Pacing Pulse Width: 0.24 ms
Lead Channel Setting Sensing Sensitivity: 2 mV

## 2019-11-06 NOTE — Progress Notes (Signed)
Remote pacemaker transmission.   

## 2019-11-11 ENCOUNTER — Encounter: Payer: Self-pay | Admitting: Cardiovascular Disease

## 2019-11-11 ENCOUNTER — Other Ambulatory Visit: Payer: Self-pay

## 2019-11-11 ENCOUNTER — Ambulatory Visit: Payer: Medicare HMO | Admitting: Cardiovascular Disease

## 2019-11-11 DIAGNOSIS — I442 Atrioventricular block, complete: Secondary | ICD-10-CM | POA: Diagnosis not present

## 2019-11-11 DIAGNOSIS — I2510 Atherosclerotic heart disease of native coronary artery without angina pectoris: Secondary | ICD-10-CM

## 2019-11-11 DIAGNOSIS — E785 Hyperlipidemia, unspecified: Secondary | ICD-10-CM

## 2019-11-11 DIAGNOSIS — Z794 Long term (current) use of insulin: Secondary | ICD-10-CM

## 2019-11-11 DIAGNOSIS — E118 Type 2 diabetes mellitus with unspecified complications: Secondary | ICD-10-CM

## 2019-11-11 DIAGNOSIS — Z951 Presence of aortocoronary bypass graft: Secondary | ICD-10-CM

## 2019-11-11 DIAGNOSIS — I10 Essential (primary) hypertension: Secondary | ICD-10-CM | POA: Diagnosis not present

## 2019-11-11 NOTE — Progress Notes (Signed)
Patient ID: Nathan Howell, male   DOB: 05-14-1934, 85 y.o.   MRN: [id number]    Primary M.D.: Dr. Burnard Bunting  HPI: Nathan Howell is a 85 y.o. male who presents for a 7 month cardiology evaluation.  Nathan Howell has known CAD and on 02/24/2004 underwent CABG surgery x3 by Dr. Servando Snare (LIMA to the LAD, vein to the intermediate, and into the distal circumflex). [name] was nondominant and not bypassed. Additional problems include type 2 diabetes mellitus, hypertension, as well as hyperlipidemia. His last nuclear perfusion study in 2012 which showed significant diaphragmatic attenuation but was without ischemia. Ejection fraction 65%. An echo Doppler study in November 2012 showed moderate concentric LVH with normal systolic function grade 1 diastolic dysfunction. He did have moderate LA dilatation, mild mitral annular calcification trace MR, trace TR, and aortic valve sclerosis.  Nathan Howell has continued to be stable from a cardiac standpoint. He denies recent episodes of chest pain. He remains active.  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  He also does all his own yard work.  He denies  palpitations. He denies presyncope or syncope.    He had undergone colonoscopy by Dr. Henrene Pastor and was found to have having diverticulitis.  He states his significant diarrhea markedly improved with a significant change in his diet.  When I saw him in March 2018 he had  undergone eye surgery by Dr. Valetta Close for a glaucoma and some bleeding issues.   I reviewed his complete set of blood work from 06/21/2016.  His labs were stable.  BUN was 18, creatinine 0.8.  Hemoglobin and hematocrit were 13.5 and 40.5, respectively.  TSH and PSA were normal.  Total cholesterol was 131, triglycerides 62, HDL 37, LDL was 82.   He had undergone a nuclear perfusion study in November 2018 which remained low risk and showed normal perfusion and function.  EF was 59%.  He remains active cutting grass and also  assisting his son, Nathan Howell, in his roofing business but he does not climb on the roof.  He underwent extensive surgery to the left side of his face/neck due to Merkel cell carcinoma.  He underwent 30 treatments of radiation to his lymph nodes.  He is unaware of palpitations, presyncope or syncope.  I  saw him in June 2019 at which time he remained active.  He was evaluated in the emergency room October 2019 after a fall when he tripped over a cushion falling onto a concrete floor.  He did not lose consciousness but did sustain some tears to CT of his head was unremarkable.  There was a minimally displaced nasal fracture.  Contusions on his face.  He denies any recent chest pain or palpitations.  He has continued to be on metoprolol 25 mg twice a day, amlodipine 5 mg daily and losartan 100 mg daily in addition to [facility] for hypertension.  He denies significant swelling.  He is on rosuvastatin for hyperlipidemia.  He is on glipizide and insulin for diabetes.    I last saw him in December 2019 at which time he remained stable with reference to his CAD and was without recurrent angina.  His ECG revealed stable first-degree heart block with sinus rhythm.  He had a syncopal spell and presented with complete heart block in October 2020.  He underwent successful implantation of a Medtronic Micra leadless pacemaker by Dr. Rayann Heman after initially having undergone temporary pacemaker insertion.  An echo Doppler study on  January 30, 2019 showed an EF of 50 to 55%.  Septal motion was abnormal but consistent with his RV pacemaker.  There was grade 1 diastolic dysfunction.  Mild to moderate mitral annular calcification.  I last saw him in January 2021 at which time he felt well l.  He will be having an EP follow-up evaluation with Dr. Rayann Heman next month.  He denied anginal symptoms.  He denied any episodes of dizziness.  He has more energy.  Since I last saw him, he has remained cardiac stable.  He undergoes every  72-monthdevice pacemaker checks.  He denies any chest pain or shortness of breath.  He will be seeing Dr. AReynaldo Miniumin the near future who will be checking complete set of laboratory.  He denies any chest pain PND orthopnea.  He denies presyncope or syncope.  He is unaware of any tachypalpitations.  He presents for evaluation.  Past Medical History:  Diagnosis Date  . Abdominal hernia   . Asthma    [place] - HFA 50% 03/16/09 > 50% 05/13/10  . Collagenous colitis   . COPD (chronic obstructive pulmonary disease) ([place] 2015  . Coronary artery disease 2005  . Diverticulosis   . GERD (gastroesophageal reflux disease)   . Glaucoma   . History of stress test 02/2011   Which showed mild inferior thinning felt to be diaphragmatic attenuation artifact without ichemia. Poststress EF was 65%.  .Marland KitchenHx of echocardiogram    EF>55% this showed moderate concentric LVH with normal systolic function. There was evidence for grade 1 diastolic relaxation abnormality with possible very small region of apical lateral hypokinesis.He did have moderate left atrial dilatation by volume assessment. There was mild mitral annular calcification with trace MR, trace TR, mild aortic valve sclerosis without stenosis    . Hyperlipidemia   . Hypertension   . Rhinitis medicamentosa    [place]  . S/P placement of cardiac pacemaker 01/31/19 leadless  02/01/2019  . Type II diabetes mellitus ([place]     Past Surgical History:  Procedure Laterality Date  . CARDIAC CATHETERIZATION  02/2004   Normal left ventricular function. Significant multivessel coronary artery disease corresponding to left main equivalent disease with diffuse tubular narrowing of 50% in the proximal left anterior descending, 905 in the ramus intermadius vessel and 80% in the most proximal circumflex veseel with 70% mid A-V groove circumflex stenosis.  .Marland KitchenCATARACT EXTRACTION W/ INTRAOCULAR LENS IMPLANT Right 2003   . CORNEAL TRANSPLANT Right ~ 2004  .  CORONARY ANGIOPLASTY    . CORONARY ARTERY BYPASS GRAFT     x3 By Dr GServando Snarewith LIMA to the LAD, a vein to the intermediate, a vein to the distal circumflex. [name] was nondominant and not bypassed.  .Marland KitchenGLAUCOMA VALVE INSERTION Right   . HAND SURGERY Right    "don't remember what was done"  . [place]  "not sure which side"  . KNEE ARTHROSCOPY Bilateral   . PACEMAKER LEADLESS INSERTION N/A 01/31/2019   Procedure: PACEMAKER LEADLESS INSERTION;  Surgeon: AThompson Grayer MD;  [facility];  Service: Cardiovascular;  Laterality: N/A;  . TEMPORARY PACEMAKER N/A 01/30/2019   Procedure: TEMPORARY PACEMAKER;  Surgeon: MBurnell Blanks MD;  [facility];  Service: Cardiovascular;  Laterality: N/A;    No Known Allergies  Current Outpatient Medications  Medication Sig Dispense Refill  . amLODipine (NORVASC) 5 MG tablet Take 5 mg by mouth daily.    .[name]  aspirin 81 MG tablet Take 81 mg by mouth daily.     . budesonide (ENTOCORT EC) 3 MG 24 hr capsule TAKE (2) CAPSULES DAILY. 60 capsule 2  . donepezil (ARICEPT) 5 MG tablet Take 5 mg by mouth daily.    . dorzolamide-timolol (COSOPT) 22.3-6.8 MG/ML ophthalmic solution Place 1 drop into the right eye 2 (two) times daily.    [name] glipiZIDE (GLUCOTROL) 5 MG tablet Take 5 mg by mouth 2 (two) times daily before a meal.     . Insulin Detemir (LEVEMIR FLEXTOUCH) 100 UNIT/ML Pen Inject 19 Units into the skin daily before breakfast.     . losartan (COZAAR) 100 MG tablet Take 100 mg by mouth at bedtime.     . metFORMIN (GLUCOPHAGE-XR) 500 MG 24 hr tablet Take 2 tablets (1,000 mg total) by mouth daily with breakfast.    . metoprolol tartrate (LOPRESSOR) 25 MG tablet TAKE 1 TABLET TWICE A DAY 180 tablet 1  . rosuvastatin (CRESTOR) 20 MG tablet TAKE 1 TABLET DAILY 90 tablet 0  . triamterene-hydrochlorothiazide (MAXZIDE-25) 37.5-25 MG tablet Take 1 tablet by mouth daily.     No current facility-administered  medications for this visit.    Social History   Socioeconomic History  . Marital status: Married    Spouse name: Not on file  . Number of children: 3  . Years of education: Not on file  . Highest education level: Not on file  Occupational History  . Occupation: Retired  Tobacco Use  . Smoking status: Never Smoker  . Smokeless tobacco: Current User    Types: Snuff  Substance and Sexual Activity  . Alcohol use: No    Alcohol/week: 0.0 standard drinks  . Drug use: No  . Sexual activity: Not Currently  Other Topics Concern  . Not on file  Social History Narrative   Never smoked cigs regularly but stopped all exposure 74s   Lives with wife   Social Determinants of Health   Financial Resource Strain:   . Difficulty of Paying Living Expenses:   Food Insecurity:   . Worried About Charity fundraiser in the Last Year:   . Arboriculturist in the Last Year:   Transportation Needs:   . Film/video editor (Medical):   [name] Lack of Transportation (Non-Medical):   Physical Activity:   . Days of Exercise per Week:   . Minutes of Exercise per Session:   Stress:   . Feeling of Stress :   Social Connections:   . Frequency of Communication with Friends and Family:   . Frequency of Social Gatherings with Friends and Family:   . Attends Religious Services:   . Active Member of Clubs or Organizations:   . Attends Archivist Meetings:   [name] Marital Status:   Intimate Partner Violence:   . Fear of Current or Ex-Partner:   . Emotionally Abused:   [name] Physically Abused:   . Sexually Abused:     Family History  Problem Relation Age of Onset  . Asthma Mother   . Emphysema Father   . Colon cancer Neg Hx    Social history is normal and that he is married has 3 children 4 grandchildren. He does try to exercise and remains active. There is no tobacco or alcohol use.  ROS General: Negative; No fevers, chills, or night sweats;  HEENT: Bilateral hearing aids.,no sinus  congestion, difficulty swallowing Pulmonary: Negative; No cough, wheezing, shortness of breath, hemoptysis Cardiovascular: See HPI  GI: As if her recent diarrhea and diverticular disease. GU: Negative; No dysuria, hematuria, or difficulty voiding Musculoskeletal: Negative; no myalgias, joint pain, or weakness Hematologic/Oncology: Merkel cell carcinoma Endocrine: Negative; no heat/cold intolerance; no diabetes Neuro: Negative; no changes in balance, headaches Skin: History of Merkel cell carcinoma Psychiatric: Negative; No behavioral problems, depression Sleep: Negative; No snoring, daytime sleepiness, hypersomnolence, bruxism, restless legs, hypnogognic hallucinations, no cataplexy Other comprehensive 14 point system review is negative.  PE BP 136/66   Pulse (!) 57   Ht 5' 11.5" (1.816 m)   Wt 185 lb (83.9 kg)   BMI 25.44 kg/m    Repeat blood pressure by me was 128/68  Wt Readings from Last 3 Encounters:  11/11/19 185 lb (83.9 kg)  06/06/19 172 lb (78 kg)  05/07/19 170 lb 12.8 oz (77.5 kg)   General: Alert, oriented, no distress.  Skin: normal turgor, no rashes, warm and dry HEENT: Normocephalic, atraumatic. Pupils equal round and reactive to light; sclera anicteric; extraocular muscles intact;  Nose without nasal septal hypertrophy Mouth/Parynx benign; Mallinpatti scale 2 Neck: No JVD, no carotid bruits; normal carotid upstroke Lungs: clear to ausculatation and percussion; no wheezing or rales Chest wall: without tenderness to palpitation Heart: PMI not displaced, RRR, s1 s2 normal, 1/6 systolic murmur, no diastolic murmur, no rubs, gallops, thrills, or heaves Abdomen: soft, nontender; no hepatosplenomehaly, BS+; abdominal aorta nontender and not dilated by palpation. Back: no CVA tenderness Pulses 2+ Musculoskeletal: full range of motion, normal strength, no joint deformities Extremities: no clubbing cyanosis or edema, Homan's sign negative  Neurologic: grossly nonfocal;  Cranial nerves grossly wnl Psychologic: Normal mood and affect   ECG (independently read by me): Atrially sensed, ventricular paced rhythm at 57 bpm.  Intervals normal  January 2021 ECG (independently read by me): Atrially sensed ventricular paced rhythm at 67 bpm  March 20, 2018 ECG (independently read by me): Normal sinus rhythm at 68 bpm, left bundle branch block with repolarization changes.  First-degree AV block with a PR interval 238 ms.  June 2019 ECG (independently read by me): Normal sinus rhythm at 63 bpm.  First degree AV block.  June 19, 2016 ECG (independently read by me): Normal sinus rhythm at 64 bpm.  First-degree AV block with a PR interval at 24 ms.  Left axis deviation.  October 2017 ECG (independently read by me): Normal sinus rhythm with first-degree AV block.  Nonspecific ST changes.  Normal intervals.  March 2017 ECG (independently read by me): Sinus rhythm with first-degree AV block with a pulse of 68 bpm.  PR interval 212 ms.  May 2016 ECG (independently read by me): Normal sinus rhythm at 72 bpm.  Incomplete left bundle-branch block.  T-wave abnormality in aVL.  Normal intervals.  November 2015 ECG (independently read by me): Sinus rhythm with first-degree AV block with a heart rate at 80 bpm.  PR interval 228 ms.  T-wave changes in leads 1 and aVL.  Poor anterior R-wave progression.  Prior November 2014 ECG: Sinus rhythm with first-degree AV block. Poor progression anteroseptally. ST-T changes laterally, unchanged  LABS:  BMP Latest Ref Rng & Units 02/01/2019 01/31/2019 01/30/2019  Glucose 70 - 99 mg/dL 131(H) 122(H) 95  BUN 8 - 23 mg/dL _0 Creatinine 0.61 - 1.24 mg/dL 1.09 0.95 1.14  Sodium 135 - 145 mmol/L 127(L) 125(L) 126(L)  Potassium 3.5 - 5.1 mmol/L 3.9 4.1 4.5  Chloride 98 - 111 mmol/L 92(L) 93(L) 93(L)  CO2 22 - 32 mmol/L  _0 Calcium 8.9 - 10.3 mg/dL 8.7(L) 8.9 9.0   Hepatic Function Latest Ref Rng & Units 01/30/2019 02/16/2015    Total Protein 6.5 - 8.1 g/dL 6.5 7.2  Albumin 3.5 - 5.0 g/dL 3.8 4.0  AST 15 - 41 U/L 18 15  ALT 0 - 44 U/L 22 12(L)  Alk Phosphatase 38 - 126 U/L 29(L) 44  Total Bilirubin 0.3 - 1.2 mg/dL 0.7 0.8   CBC Latest Ref Rng & Units 01/31/2019 01/30/2019 02/17/2015  WBC 4.0 - 10.5 K/uL 6.2 6.6 4.6  Hemoglobin 13.0 - 17.0 g/dL 12.3(L) 12.4(L) 12.4(L)  Hematocrit 39 - 52 % 35.5(L) 35.7(L) 34.0(L)  Platelets 150 - 400 K/uL 206 198 236   Lab Results  Component Value Date   MCV 92.4 01/31/2019   MCV 92.7 01/30/2019   MCV 87.6 02/17/2015    Lab Results  Component Value Date   TSH 2.958 01/30/2019    Lab Results  Component Value Date   HGBA1C 6.2 (H) 02/17/2015    RADIOLOGY: No results found.  IMPRESSION:  1. CAD in native artery   2. Hx of CABG   3. Complete heart block [facility]): Medtronic leadless pacemaker October 2020   4. Essential hypertension   5. Hyperlipidemia with target LDL less than 70   6. Type 2 diabetes mellitus with complication, with long-term current use of insulin [facility])     ASSESSMENT AND PLAN: Mr. Robin Pafford is an 85 year old white male who is status post CABG surgery x3 in November 2005. A stress test in 2012 revealed mild inferior thinning felt to be due to diaphragmatic attenuation without ischemia.  His last stress study from February 20, 2017 continued to be low risk and showed normal perfusion with normal left ventricular regional and global systolic function.  EF was 59%.  He had developed 2 syncopal spells and was found to have complete heart block leading to ultimate permanent pacemaker insertion with a Medtronic leadless pacemaker.  An echo Doppler study at that time showed EF estimated 50 to 55% with grade 1 diastolic dysfunction.  Presently, he continues to do well without anginal symptoms.  His blood pressure today on repeat by me is stable at 128/68 and he continues to be on amlodipine 5 mg, losartan 100 mg, triamterene/HCTZ  in addition to metoprolol  tartrate 25 mg twice a day.  He continues to be on rosuvastatin 20 mg for hyperlipidemia target LDL less than 70.  He will be undergoing repeat laboratory by Dr. Reynaldo Minium.  He is diabetic on Levemir insulin, glipizide and Metformin.   Time spent: 30 minutes Troy Sine, MD, [facility]  11/12/2019 2:57 PM

## 2019-11-11 NOTE — Patient Instructions (Signed)

## 2019-11-12 ENCOUNTER — Encounter: Payer: Self-pay | Admitting: Cardiovascular Disease

## 2019-12-22 DIAGNOSIS — H02051 Trichiasis without entropian right upper eyelid: Secondary | ICD-10-CM | POA: Diagnosis not present

## 2019-12-29 ENCOUNTER — Other Ambulatory Visit: Payer: Self-pay | Admitting: Physician Assistant

## 2019-12-31 DIAGNOSIS — I10 Essential (primary) hypertension: Secondary | ICD-10-CM | POA: Diagnosis not present

## 2019-12-31 DIAGNOSIS — Z125 Encounter for screening for malignant neoplasm of prostate: Secondary | ICD-10-CM | POA: Diagnosis not present

## 2019-12-31 DIAGNOSIS — E785 Hyperlipidemia, unspecified: Secondary | ICD-10-CM | POA: Diagnosis not present

## 2019-12-31 DIAGNOSIS — E1169 Type 2 diabetes mellitus with other specified complication: Secondary | ICD-10-CM | POA: Diagnosis not present

## 2020-01-07 ENCOUNTER — Other Ambulatory Visit: Payer: Self-pay | Admitting: Cardiovascular Disease

## 2020-01-08 DIAGNOSIS — Z0000 Encounter for general adult medical examination without abnormal findings: Secondary | ICD-10-CM | POA: Diagnosis not present

## 2020-01-08 DIAGNOSIS — J449 Chronic obstructive pulmonary disease, unspecified: Secondary | ICD-10-CM | POA: Diagnosis not present

## 2020-01-08 DIAGNOSIS — I10 Essential (primary) hypertension: Secondary | ICD-10-CM | POA: Diagnosis not present

## 2020-01-08 DIAGNOSIS — E1169 Type 2 diabetes mellitus with other specified complication: Secondary | ICD-10-CM | POA: Diagnosis not present

## 2020-01-08 DIAGNOSIS — Z23 Encounter for immunization: Secondary | ICD-10-CM | POA: Diagnosis not present

## 2020-01-08 DIAGNOSIS — R82998 Other abnormal findings in urine: Secondary | ICD-10-CM | POA: Diagnosis not present

## 2020-01-08 DIAGNOSIS — E785 Hyperlipidemia, unspecified: Secondary | ICD-10-CM | POA: Diagnosis not present

## 2020-01-10 IMAGING — CT NM PET TUM IMG INITIAL (PI) SKULL BASE T - THIGH
1 of 8 series · 1 of 25 positions shown · non-contrast
Comparison: None

CLINICAL DATA: Initial treatment strategy for Aimed cell carcinoma
of the face.. 82 y.o. male with Stage Jambo Jet Saboi negative s/p WLE
and JIM biopsy who is a candidate for adjuvant RT to the right check
and draining LN after PET/CT staging.

EXAM:
NUCLEAR MEDICINE PET SKULL BASE TO THIGH
TECHNIQUE: 9.5 mCi F-18 FDG was injected intravenously. Full-ring PET imaging
was performed from the skull base to thigh after the radiotracer. CT
data was obtained and used for attenuation correction and anatomic
localization.
FASTING BLOOD GLUCOSE:  Value: 153 mg/dl

[Series 3: pet [id number]_[id number] ac · axial · 5.0mm · 4.07mm/px · 1 of 277 slices shown]
[im 166/277]
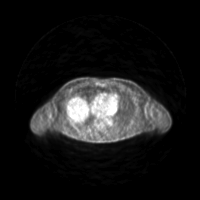

[1 of 25 positions shown; findings below may reference images not displayed]

FINDINGS: NECK

There is a broad region of hypermetabolic activity associated with
the RIGHT cheek area anterior to the auricle with SUV max equal 4.7.

Inferior to the RIGHT auricle there is a more focal region of
hypermetabolic activity which is more intense with SUV max equal
7.3. There is a rounded lymph node deep to the skin surface and
posterior to the parotid gland at this level measuring 1.2 cm (image
50 of the CT and fused data set).

No hypermetabolic cervical lymph nodes. Several small level 2 lymph
nodes are present beneath the sternocleidomastoid muscle without
significant radiotracer metabolic activity.

CHEST

No hypermetabolic mediastinal or hilar nodes. No suspicious
pulmonary nodules on the CT scan.

ABDOMEN/PELVIS

No abnormal hypermetabolic activity within the liver, pancreas,
adrenal glands, or spleen. No hypermetabolic lymph nodes in the
abdomen or pelvis.

SKELETON

No focal hypermetabolic activity to suggest skeletal metastasis.
IMPRESSION: 1. Hypermetabolic activity inferior to the RIGHT auricle suggest
residual carcinoma; however, multiple small surgical clips in this
site. Therefore activity could potentially represent postsurgical
inflammation.
2. Hypermetabolic lymph node just deep to the activity described
impression 1 and just posterior to the RIGHT parotid gland is
concerning for a metastatic lymph node. Alternatively this could
represent a primary parotid neoplasm. Comparison with remote neck CT
would be valuable.
3. No additional evidence of metastatic adenopathy in the neck.
4. Broad region of metabolic activity in RIGHT cheek anterior to the
auricle suggest postsurgical change.
5. No evidence of distant metastatic disease.

## 2020-01-17 ENCOUNTER — Emergency Department (HOSPITAL_BASED_OUTPATIENT_CLINIC_OR_DEPARTMENT_OTHER): Payer: Medicare HMO

## 2020-01-17 ENCOUNTER — Emergency Department (HOSPITAL_BASED_OUTPATIENT_CLINIC_OR_DEPARTMENT_OTHER)
Admission: EM | Admit: 2020-01-17 | Discharge: 2020-01-17 | Disposition: A | Discharge status: Home / Self Care | Payer: Medicare HMO | Attending: Emergency Medicine | Admitting: Emergency Medicine

## 2020-01-17 ENCOUNTER — Other Ambulatory Visit: Payer: Self-pay

## 2020-01-17 DIAGNOSIS — Z79899 Other long term (current) drug therapy: Secondary | ICD-10-CM | POA: Diagnosis not present

## 2020-01-17 DIAGNOSIS — S50312A Abrasion of left elbow, initial encounter: Secondary | ICD-10-CM | POA: Diagnosis not present

## 2020-01-17 DIAGNOSIS — I2510 Atherosclerotic heart disease of native coronary artery without angina pectoris: Secondary | ICD-10-CM | POA: Insufficient documentation

## 2020-01-17 DIAGNOSIS — J449 Chronic obstructive pulmonary disease, unspecified: Secondary | ICD-10-CM | POA: Insufficient documentation

## 2020-01-17 DIAGNOSIS — E119 Type 2 diabetes mellitus without complications: Secondary | ICD-10-CM | POA: Diagnosis not present

## 2020-01-17 DIAGNOSIS — Z951 Presence of aortocoronary bypass graft: Secondary | ICD-10-CM | POA: Diagnosis not present

## 2020-01-17 DIAGNOSIS — G9389 Other specified disorders of brain: Secondary | ICD-10-CM | POA: Insufficient documentation

## 2020-01-17 DIAGNOSIS — S51012A Laceration without foreign body of left elbow, initial encounter: Secondary | ICD-10-CM | POA: Insufficient documentation

## 2020-01-17 DIAGNOSIS — Z794 Long term (current) use of insulin: Secondary | ICD-10-CM | POA: Insufficient documentation

## 2020-01-17 DIAGNOSIS — R519 Headache, unspecified: Secondary | ICD-10-CM | POA: Insufficient documentation

## 2020-01-17 DIAGNOSIS — Y9389 Activity, other specified: Secondary | ICD-10-CM | POA: Insufficient documentation

## 2020-01-17 DIAGNOSIS — S59902A Unspecified injury of left elbow, initial encounter: Secondary | ICD-10-CM | POA: Diagnosis present

## 2020-01-17 DIAGNOSIS — I10 Essential (primary) hypertension: Secondary | ICD-10-CM | POA: Insufficient documentation

## 2020-01-17 DIAGNOSIS — S0990XA Unspecified injury of head, initial encounter: Secondary | ICD-10-CM | POA: Diagnosis not present

## 2020-01-17 DIAGNOSIS — Z23 Encounter for immunization: Secondary | ICD-10-CM | POA: Insufficient documentation

## 2020-01-17 DIAGNOSIS — M25522 Pain in left elbow: Secondary | ICD-10-CM

## 2020-01-17 DIAGNOSIS — Z950 Presence of cardiac pacemaker: Secondary | ICD-10-CM | POA: Insufficient documentation

## 2020-01-17 DIAGNOSIS — Z7984 Long term (current) use of oral hypoglycemic drugs: Secondary | ICD-10-CM | POA: Insufficient documentation

## 2020-01-17 DIAGNOSIS — R9082 White matter disease, unspecified: Secondary | ICD-10-CM | POA: Insufficient documentation

## 2020-01-17 DIAGNOSIS — T148XXA Other injury of unspecified body region, initial encounter: Secondary | ICD-10-CM

## 2020-01-17 MED ORDER — TETANUS-DIPHTH-ACELL PERTUSSIS 5-2.5-18.5 LF-MCG/0.5 IM SUSP
0.5000 mL | Freq: Once | INTRAMUSCULAR | Status: AC
  Administered 2020-01-17: 0.5 mL via INTRAMUSCULAR
  Filled 2020-01-17: qty 0.5

## 2020-01-17 NOTE — ED Triage Notes (Signed)
Pt arrives pov with c/o fall late yesterday afternoon. Pt c/o left arm pain and bleeding.

## 2020-01-17 NOTE — ED Notes (Signed)
Left arm swelling noted.

## 2020-01-17 NOTE — ED Provider Notes (Signed)
[facility] EMERGENCY DEPARTMENT Provider Note   CSN: [id number] Arrival date & time: 01/17/20  1207     History Chief Complaint  Patient presents with  . Fall    Nathan Howell is a 85 y.o. male.  [place] off of a toy car yesterday while playing with his grandson.  Landed on his left elbow.  May have hit his head.  Is on blood thinners.  The history is provided by the patient.  Arm Injury Location:  Elbow Elbow location:  L elbow Injury: yes   Pain details:    Quality:  Aching   Severity:  Mild   Onset quality:  Gradual   Timing:  Intermittent   Progression:  Waxing and waning Relieved by:  Nothing Worsened by:  Nothing Associated symptoms: swelling   Associated symptoms: no back pain, no decreased range of motion, no fatigue, no fever and no muscle weakness        Past Medical History:  Diagnosis Date  . Abdominal hernia   . Asthma    [place] - HFA 50% 03/16/09 > 50% 05/13/10  . Collagenous colitis   . COPD (chronic obstructive pulmonary disease) ([place]) 2015  . Coronary artery disease 2005  . Diverticulosis   . GERD (gastroesophageal reflux disease)   . Glaucoma   . History of stress test 02/2011   Which showed mild inferior thinning felt to be diaphragmatic attenuation artifact without ichemia. Poststress EF was 65%.  [name] Hx of echocardiogram    EF>55% this showed moderate concentric LVH with normal systolic function. There was evidence for grade 1 diastolic relaxation abnormality with possible very small region of apical lateral hypokinesis.He did have moderate left atrial dilatation by volume assessment. There was mild mitral annular calcification with trace MR, trace TR, mild aortic valve sclerosis without stenosis    . Hyperlipidemia   . Hypertension   . Rhinitis medicamentosa    per remote [place] w/u  . S/P placement of cardiac pacemaker 01/31/19 leadless  02/01/2019  . Type II diabetes mellitus [facility])     Patient Active Problem List    Diagnosis Date Noted  . S/P placement of cardiac pacemaker 01/31/19 leadless MDT 02/01/2019  . Malnutrition of moderate degree 01/31/2019  . Symptomatic bradycardia   . Complete heart block ([place]) 01/30/2019  . Hyponatremia 02/16/2015  . Generalized weakness 02/16/2015  . Headache 02/16/2015  . Asthma 02/16/2015  . COPD (chronic obstructive pulmonary disease) ([place]) 02/16/2015  . Glaucoma 02/16/2015  . Diabetes mellitus without complication ([place])   . CAD (coronary artery disease) 02/04/2013  . HTN (hypertension) 02/04/2013  . Hyperlipidemia with target LDL less than 70 02/04/2013  . Cough 05/06/2010  . DIABETES MELLITUS, TYPE II 11/03/2009  . WEIGHT LOSS-ABNORMAL 11/03/2009  . Diarrhea 11/03/2009  . ALLERGIC RHINITIS 03/01/2009  . Cough variant asthma 03/01/2009    Past Surgical History:  Procedure Laterality Date  . CARDIAC CATHETERIZATION  02/2004   Normal left ventricular function. Significant multivessel coronary artery disease corresponding to left main equivalent disease with diffuse tubular narrowing of 50% in the proximal left anterior descending, 905 in the ramus intermadius vessel and 80% in the most proximal circumflex veseel with 70% mid A-V groove circumflex stenosis.  [name] CATARACT EXTRACTION W/ INTRAOCULAR LENS IMPLANT Right 2003   . CORNEAL TRANSPLANT Right ~ 2004  . CORONARY ANGIOPLASTY    . CORONARY ARTERY BYPASS GRAFT     x3 By Dr Servando Snare with LIMA to the LAD, a vein to the  intermediate, a vein to the distal circumflex. [name] was nondominant and not bypassed.  [name] GLAUCOMA VALVE INSERTION Right   . HAND SURGERY Right    "don't remember what was done"  . [place]   "not sure which side"  . KNEE ARTHROSCOPY Bilateral   . PACEMAKER LEADLESS INSERTION N/A 01/31/2019   Procedure: PACEMAKER LEADLESS INSERTION;  Surgeon: Thompson Grayer, MD;  Location: [facility] CV LAB;  Service: Cardiovascular;  Laterality: N/A;  . TEMPORARY PACEMAKER N/A  01/30/2019   Procedure: TEMPORARY PACEMAKER;  Surgeon: Burnell Blanks, MD;  Location: [facility] CV LAB;  Service: Cardiovascular;  Laterality: N/A;       Family History  Problem Relation Age of Onset  . Asthma Mother   . Emphysema Father   . Colon cancer Neg Hx     Social History   Tobacco Use  . Smoking status: Never Smoker  . Smokeless tobacco: Current User    Types: Snuff  Substance Use Topics  . Alcohol use: No    Alcohol/week: 0.0 standard drinks  . Drug use: No    Home Medications Prior to Admission medications   Medication Sig Start Date End Date Taking? Authorizing Provider  amLODipine (NORVASC) 5 MG tablet Take 5 mg by mouth daily.    [provider]  aspirin 81 MG tablet Take 81 mg by mouth daily.  11/18/09   [provider]  budesonide (ENTOCORT EC) 3 MG 24 hr capsule TAKE (2) CAPSULES DAILY. 12/29/19   Levin Erp, PA  donepezil (ARICEPT) 5 MG tablet Take 5 mg by mouth daily. 10/30/19   [provider]  dorzolamide-timolol (COSOPT) 22.3-6.8 MG/ML ophthalmic solution Place 1 drop into the right eye 2 (two) times daily.    [provider]  glipiZIDE (GLUCOTROL) 5 MG tablet Take 5 mg by mouth 2 (two) times daily before a meal.     [provider]  Insulin Detemir (LEVEMIR FLEXTOUCH) 100 UNIT/ML Pen Inject 19 Units into the skin daily before breakfast.     [provider]  losartan (COZAAR) 100 MG tablet Take 100 mg by mouth at bedtime.     [provider]  metFORMIN (GLUCOPHAGE-XR) 500 MG 24 hr tablet Take 2 tablets (1,000 mg total) by mouth daily with breakfast. 02/03/19   Isaiah Serge, NP  metoprolol tartrate (LOPRESSOR) 25 MG tablet TAKE 1 TABLET TWICE A DAY 08/28/19   Troy Sine, MD  rosuvastatin (CRESTOR) 20 MG tablet TAKE 1 TABLET DAILY 01/07/20   Troy Sine, MD  triamterene-hydrochlorothiazide (MAXZIDE-25) 37.5-25 MG tablet Take 1 tablet by mouth daily.    [provider]    Allergies    Patient has no known allergies.  Review of Systems   Review of Systems  Constitutional: Negative for chills, fatigue and fever.  HENT: Negative for ear pain and sore throat.   Eyes: Negative for pain and visual disturbance.  Respiratory: Negative for cough and shortness of breath.   Cardiovascular: Negative for chest pain and palpitations.  Gastrointestinal: Negative for abdominal pain and vomiting.  Genitourinary: Negative for dysuria and hematuria.  Musculoskeletal: Positive for joint swelling. Negative for arthralgias and back pain.  Skin: Positive for wound. Negative for color change and rash.  Neurological: Negative for seizures and syncope.  All other systems reviewed and are negative.   Physical Exam Updated Vital Signs BP (!) 176/69 (BP Location: Right Arm)   Pulse 61   Temp 98.2  F (36.8 C) (Oral)   Resp 16   Ht 5\' 11"  (1.803 m)   Wt 83 kg   SpO2 98%   BMI 25.52 kg/m   Physical Exam Vitals and nursing note reviewed.  Constitutional:      General: He is not in acute distress.    Appearance: He is well-developed. He is not ill-appearing.  HENT:     Head: Normocephalic and atraumatic.     Right Ear: Tympanic membrane normal.  Eyes:     Extraocular Movements: Extraocular movements intact.     Conjunctiva/sclera: Conjunctivae normal.     Pupils: Pupils are equal, round, and reactive to light.  Cardiovascular:     Rate and Rhythm: Normal rate and regular rhythm.     Heart sounds: No murmur heard.   Pulmonary:     Effort: Pulmonary effort is normal. No respiratory distress.     Breath sounds: Normal breath sounds.  Abdominal:     Palpations: Abdomen is soft.     Tenderness: There is no abdominal tenderness.  Musculoskeletal:        General: Swelling and tenderness present. Normal range of motion.     Cervical back: Normal range of motion and neck supple. No tenderness.     Comments: Tenderness to the left elbow but no obvious  dislocation or deformity with some mild swelling, normal range of motion of the left elbow  Skin:    General: Skin is warm and dry.     Comments: Skin tear to left elbow  Neurological:     General: No focal deficit present.     Mental Status: He is alert and oriented to person, place, and time.     Cranial Nerves: No cranial nerve deficit.     Sensory: No sensory deficit.     Motor: No weakness.     Coordination: Coordination normal.  Psychiatric:        Mood and Affect: Mood normal.     ED Results / Procedures / Treatments   Labs (all labs ordered are listed, but only abnormal results are displayed) Labs Reviewed - No data to display  EKG None  Radiology DG Elbow Complete Left  Result Date: 01/17/2020 CLINICAL DATA:  Post fall yesterday now with laceration about the left elbow. EXAM: LEFT ELBOW - COMPLETE 3+ VIEW COMPARISON:  None. FINDINGS: The lateral radiograph is degraded due to obliquity. Old deformity involving the neck of the radius with associated degenerative change of both the radiocapitellar and ulnar humeral joints with joint space loss, subchondral sclerosis and osteophytosis. No definite acute fracture or elbow joint effusion. Presumably chronic punctate radiopaque foreign bodies/dermal calcifications are seen about the elbow. No subcutaneous emphysema. IMPRESSION: 1. No definite acute displaced fracture or elbow joint effusion. 2. Old deformity involving the radial neck with associated degenerative change of the elbow joint. 3. Presumably chronic punctate radiopaque foreign bodies/dermal calcifications about the elbow. Electronically Signed   By: Sandi Mariscal M.D.   On: 01/17/2020 13:02   CT Head Wo Contrast  Result Date: 01/17/2020 CLINICAL DATA:  Head trauma, fall EXAM: CT HEAD WITHOUT CONTRAST TECHNIQUE: Contiguous axial images were obtained from the base of the skull through the vertex without intravenous contrast. COMPARISON:  02/16/2015 FINDINGS: Brain: No  evidence of acute infarction, hemorrhage, hydrocephalus, extra-axial collection or mass lesion/mass effect. Periventricular and deep white matter hypodensity. Redemonstrated encephalomalacia of the left parietal vertex. Vascular: No hyperdense vessel or unexpected calcification. Skull: Normal. Negative for fracture or focal lesion.  Sinuses/Orbits: No acute finding. Other: None. IMPRESSION: 1.  No acute intracranial pathology. 2. Small-vessel white matter disease. Redemonstrated encephalomalacia of the left parietal vertex. Electronically Signed   By: Eddie Candle M.D.   On: 01/17/2020 13:11    Procedures Procedures (including critical care time)  Medications Ordered in ED Medications  Tdap (BOOSTRIX) injection 0.5 mL (0.5 mLs Intramuscular Given 01/17/20 1254)    ED Course  I have reviewed the triage vital signs and the nursing notes.  Pertinent labs & imaging results that were available during my care of the patient were reviewed by me and considered in my medical decision making (see chart for details).    MDM Rules/Calculators/A&P                          ISA KOHLENBERG is an 85 year old male who presents the ED with left elbow pain after fall yesterday.  States that he is on a blood thinner but does not remember the name.  Does not know if he hit his head or not.  He was riding a Dietitian and fell backwards and landed on his left side.  Has skin tear to his left elbow with some swelling but has good range of motion with no obvious deformity.  Overall suspect contusion.  Will get left elbow x-ray.  Will get head CT as he is unsure if he hit his head and if he is on a blood thinner.  Neurologically appears intact.  Tetanus shot to be updated.  Given instructions about wound care to skin tear.  CT scan unremarkable.  X-ray without any signs of fracture.  Discharged in good condition.  This chart was dictated using voice recognition software.  Despite best efforts to proofread,  errors  can occur which can change the documentation meaning.    Final Clinical Impression(s) / ED Diagnoses Final diagnoses:  Skin abrasion  Left elbow pain    Rx / DC Orders ED Discharge Orders    None       Lennice Sites, DO 01/17/20 1315

## 2020-01-17 NOTE — Discharge Instructions (Addendum)
Your images today are unremarkable.  There is no fracture or injury.  Continue wound care as discussed.

## 2020-01-22 DIAGNOSIS — H409 Unspecified glaucoma: Secondary | ICD-10-CM | POA: Diagnosis not present

## 2020-01-22 DIAGNOSIS — R69 Illness, unspecified: Secondary | ICD-10-CM | POA: Diagnosis not present

## 2020-01-22 DIAGNOSIS — K08109 Complete loss of teeth, unspecified cause, unspecified class: Secondary | ICD-10-CM | POA: Diagnosis not present

## 2020-01-22 DIAGNOSIS — E785 Hyperlipidemia, unspecified: Secondary | ICD-10-CM | POA: Diagnosis not present

## 2020-01-22 DIAGNOSIS — I10 Essential (primary) hypertension: Secondary | ICD-10-CM | POA: Diagnosis not present

## 2020-01-22 DIAGNOSIS — K5090 Crohn's disease, unspecified, without complications: Secondary | ICD-10-CM | POA: Diagnosis not present

## 2020-01-22 DIAGNOSIS — Z7982 Long term (current) use of aspirin: Secondary | ICD-10-CM | POA: Diagnosis not present

## 2020-01-22 DIAGNOSIS — Z7951 Long term (current) use of inhaled steroids: Secondary | ICD-10-CM | POA: Diagnosis not present

## 2020-01-22 DIAGNOSIS — Z794 Long term (current) use of insulin: Secondary | ICD-10-CM | POA: Diagnosis not present

## 2020-01-22 DIAGNOSIS — E119 Type 2 diabetes mellitus without complications: Secondary | ICD-10-CM | POA: Diagnosis not present

## 2020-01-23 DIAGNOSIS — Z1212 Encounter for screening for malignant neoplasm of rectum: Secondary | ICD-10-CM | POA: Diagnosis not present

## 2020-02-02 ENCOUNTER — Ambulatory Visit (INDEPENDENT_AMBULATORY_CARE_PROVIDER_SITE_OTHER): Payer: Medicare HMO

## 2020-02-02 DIAGNOSIS — I442 Atrioventricular block, complete: Secondary | ICD-10-CM

## 2020-02-03 LAB — CUP PACEART REMOTE DEVICE CHECK
Battery Remaining Longevity: 96 mo
Battery Voltage: 3.02 V
Brady Statistic AS VP Percent: 89.94 %
Brady Statistic AS VS Percent: 0.03 %
Brady Statistic RV Percent Paced: 98.69 %
Date Time Interrogation Session: 20211101121846
Implantable Pulse Generator Implant Date: 20201030
Lead Channel Impedance Value: 700 Ohm
Lead Channel Pacing Threshold Amplitude: 0.375 V
Lead Channel Pacing Threshold Pulse Width: 0.24 ms
Lead Channel Sensing Intrinsic Amplitude: 23.4 mV
Lead Channel Setting Pacing Amplitude: 1 V
Lead Channel Setting Pacing Pulse Width: 0.24 ms
Lead Channel Setting Sensing Sensitivity: 2 mV

## 2020-02-05 NOTE — Progress Notes (Signed)
Remote pacemaker transmission.   

## 2020-02-16 DIAGNOSIS — H02051 Trichiasis without entropian right upper eyelid: Secondary | ICD-10-CM | POA: Diagnosis not present

## 2020-04-06 ENCOUNTER — Other Ambulatory Visit: Payer: Self-pay | Admitting: Cardiovascular Disease

## 2020-04-12 DIAGNOSIS — H2512 Age-related nuclear cataract, left eye: Secondary | ICD-10-CM | POA: Diagnosis not present

## 2020-04-12 DIAGNOSIS — H02051 Trichiasis without entropian right upper eyelid: Secondary | ICD-10-CM | POA: Diagnosis not present

## 2020-05-07 ENCOUNTER — Other Ambulatory Visit: Payer: Self-pay | Admitting: Physician Assistant

## 2020-05-10 ENCOUNTER — Telehealth: Payer: Self-pay | Admitting: Physician Assistant

## 2020-05-10 MED ORDER — BUDESONIDE 3 MG PO CPEP: ORAL_CAPSULE | ORAL | 3 refills | Status: DC

## 2020-05-10 NOTE — Telephone Encounter (Signed)
Refilled Budesonide 

## 2020-05-11 MED ORDER — BUDESONIDE 3 MG PO CPEP: ORAL_CAPSULE | ORAL | 3 refills | Status: DC

## 2020-05-11 NOTE — Addendum Note (Signed)
Addended by: Audrea Muscat on: 05/11/2020 04:09 PM   Modules accepted: Orders

## 2020-05-11 NOTE — Telephone Encounter (Signed)
Medication refill sent to incorrect rx. It's not [facility] in [place]. It's [facility] in [place], [place].

## 2020-05-11 NOTE — Telephone Encounter (Signed)
Budesonide resent to [facility]

## 2020-05-19 DIAGNOSIS — I442 Atrioventricular block, complete: Secondary | ICD-10-CM | POA: Diagnosis not present

## 2020-05-19 DIAGNOSIS — I10 Essential (primary) hypertension: Secondary | ICD-10-CM | POA: Diagnosis not present

## 2020-05-19 DIAGNOSIS — K51919 Ulcerative colitis, unspecified with unspecified complications: Secondary | ICD-10-CM | POA: Diagnosis not present

## 2020-05-19 DIAGNOSIS — I25810 Atherosclerosis of coronary artery bypass graft(s) without angina pectoris: Secondary | ICD-10-CM | POA: Diagnosis not present

## 2020-05-19 DIAGNOSIS — Z950 Presence of cardiac pacemaker: Secondary | ICD-10-CM | POA: Diagnosis not present

## 2020-05-19 DIAGNOSIS — I999 Unspecified disorder of circulatory system: Secondary | ICD-10-CM | POA: Diagnosis not present

## 2020-05-19 DIAGNOSIS — Z951 Presence of aortocoronary bypass graft: Secondary | ICD-10-CM | POA: Diagnosis not present

## 2020-05-19 DIAGNOSIS — E1169 Type 2 diabetes mellitus with other specified complication: Secondary | ICD-10-CM | POA: Diagnosis not present

## 2020-05-19 DIAGNOSIS — R69 Illness, unspecified: Secondary | ICD-10-CM | POA: Diagnosis not present

## 2020-05-19 DIAGNOSIS — J449 Chronic obstructive pulmonary disease, unspecified: Secondary | ICD-10-CM | POA: Diagnosis not present

## 2020-05-20 ENCOUNTER — Ambulatory Visit: Payer: Medicare HMO | Admitting: Physician Assistant

## 2020-05-20 ENCOUNTER — Other Ambulatory Visit: Payer: Self-pay

## 2020-05-20 ENCOUNTER — Encounter: Payer: Self-pay | Admitting: Physician Assistant

## 2020-05-20 VITALS — BP 114/70 | HR 54 | Ht 69.5 in | Wt 183.6 lb

## 2020-05-20 DIAGNOSIS — K52831 Collagenous colitis: Secondary | ICD-10-CM

## 2020-05-20 MED ORDER — BUDESONIDE 3 MG PO CPEP: ORAL_CAPSULE | ORAL | 3 refills | Status: DC

## 2020-05-20 NOTE — Patient Instructions (Signed)
If you are age 84 or older, your body mass index should be between 23-30. Your Body mass index is 26.72 kg/m. If this is out of the aforementioned range listed, please consider follow up with your Primary Care Provider.  If you are age 44 or younger, your body mass index should be between 19-25. Your Body mass index is 26.72 kg/m. If this is out of the aformentioned range listed, please consider follow up with your Primary Care Provider.   We have sent the following medications to your pharmacy for you to pick up at your convenience: Budesonide 3 mg   Thank you for choosing me and [facility].  Ellouise Newer, PA-C

## 2020-05-20 NOTE — Progress Notes (Signed)
Chief Complaint: Follow-up collagenous colitis  HPI:    [name] is an 86 year old male with a past medical history of COPD and complete heart block status post pacemaker 01/30/2019 echo with LVEF 50-55%) and multiple others listed below, known to Dr. Henrene Pastor, who was referred to me by Burnard Bunting, MD for follow-up of his collagenous colitis.    11/13/2018 patient seen in clinic.  Discussed he had a history of collagenous colitis diagnosed in 2016 and at that time he described having several loose stools daily most days of the week it was progressive over the past few months with sometimes more than 10 loose stools a day.  He had tried a gluten-free diet and also reduce Metformin dose without improvement.  With Imodium and Pepto he could reduce the frequency to just a few loose stools a day.  Patient had stool studies which were negative by his PCP.  Discussed may be his statin triggered his collagenous colitis.  He was started on Budesonide 9 mg daily.  Also discussed he could continue Pepto and Imodium as needed.    01/30/2019-02/01/2019 patient admitted to the hospital for complete heart block.  Patient had leadless pacemaker implantation.    03/13/2019 patient seen in clinic for follow-up and at that time was doing better with 3 budesonide tabs per day and in fact was actually switching towards constipation.  At that time recommended they decrease to 6 mg/day and see how he does.  Explained that he could stop this medication altogether if it continues to cause constipation.  Refilled Budesonide 3 mg 2 tabs p.o. daily.    Today, the patient returns to clinic accompanied by his daughter and together they explain that he is feeling perfectly well.  His daughter tells me that very occasionally he will still have some issues with constipation but this is not every day and he usually just takes a Colace or 2 and everything is better.  The patient is very happy and "I definitely do not want to go back  to that diarrhea".  He would like to continue on his current dose.    Denies fever, chills, weight loss, blood in his stool or symptoms that awaken him from sleep.  Past Medical History:  Diagnosis Date  . Abdominal hernia   . Asthma    [place] - HFA 50% 03/16/09 > 50% 05/13/10  . Collagenous colitis   . COPD (chronic obstructive pulmonary disease) ([place]) 2015  . Coronary artery disease 2005  . Diverticulosis   . GERD (gastroesophageal reflux disease)   . Glaucoma   . History of stress test 02/2011   Which showed mild inferior thinning felt to be diaphragmatic attenuation artifact without ichemia. Poststress EF was 65%.  [name] Hx of echocardiogram    EF>55% this showed moderate concentric LVH with normal systolic function. There was evidence for grade 1 diastolic relaxation abnormality with possible very small region of apical lateral hypokinesis.He did have moderate left atrial dilatation by volume assessment. There was mild mitral annular calcification with trace MR, trace TR, mild aortic valve sclerosis without stenosis    . Hyperlipidemia   . Hypertension   . Rhinitis medicamentosa    per remote [place] w/u  . S/P placement of cardiac pacemaker 01/31/19 leadless  02/01/2019  . Type II diabetes mellitus ([place])     Past Surgical History:  Procedure Laterality Date  . CARDIAC CATHETERIZATION  02/2004   Normal left ventricular function. Significant multivessel coronary artery disease corresponding to left  main equivalent disease with diffuse tubular narrowing of 50% in the proximal left anterior descending, 905 in the ramus intermadius vessel and 80% in the most proximal circumflex veseel with 70% mid A-V groove circumflex stenosis.  [name] CATARACT EXTRACTION W/ INTRAOCULAR LENS IMPLANT Right 2003   . CORNEAL TRANSPLANT Right ~ 2004  . CORONARY ANGIOPLASTY    . CORONARY ARTERY BYPASS GRAFT     x3 By Dr Servando Snare with LIMA to the LAD, a vein to the intermediate, a vein to the distal circumflex.  [name] was nondominant and not bypassed.  [name] GLAUCOMA VALVE INSERTION Right   . HAND SURGERY Right    "don't remember what was done"  . [place]   "not sure which side"  . KNEE ARTHROSCOPY Bilateral   . PACEMAKER LEADLESS INSERTION N/A 01/31/2019   Procedure: PACEMAKER LEADLESS INSERTION;  Surgeon: Thompson Grayer, MD;  Location: [facility] LAB;  Service: Cardiovascular;  Laterality: N/A;  . TEMPORARY PACEMAKER N/A 01/30/2019   Procedure: TEMPORARY PACEMAKER;  Surgeon: Burnell Blanks, MD;  Location: [facility] CV LAB;  Service: Cardiovascular;  Laterality: N/A;    Current Outpatient Medications  Medication Sig Dispense Refill  . amLODipine (NORVASC) 5 MG tablet Take 5 mg by mouth daily.    [name] aspirin 81 MG tablet Take 81 mg by mouth daily.     . budesonide (ENTOCORT EC) 3 MG 24 hr capsule TAKE (2) CAPSULES DAILY. 60 capsule 3  . donepezil (ARICEPT) 5 MG tablet Take 5 mg by mouth daily.    . dorzolamide-timolol (COSOPT) 22.3-6.8 MG/ML ophthalmic solution Place 1 drop into the right eye 2 (two) times daily.    [name] glipiZIDE (GLUCOTROL) 5 MG tablet Take 5 mg by mouth 2 (two) times daily before a meal.    . Insulin Detemir (LEVEMIR FLEXTOUCH) 100 UNIT/ML Pen Inject 19 Units into the skin daily before breakfast.     . losartan (COZAAR) 100 MG tablet Take 100 mg by mouth at bedtime.     . metFORMIN (GLUCOPHAGE-XR) 500 MG 24 hr tablet Take 2 tablets (1,000 mg total) by mouth daily with breakfast.    . metoprolol tartrate (LOPRESSOR) 25 MG tablet TAKE 1 TABLET TWICE A DAY 180 tablet 0  . rosuvastatin (CRESTOR) 20 MG tablet TAKE 1 TABLET DAILY 90 tablet 3  . triamterene-hydrochlorothiazide (MAXZIDE-25) 37.5-25 MG tablet Take 1 tablet by mouth daily.     No current facility-administered medications for this visit.    Allergies as of 05/20/2020  . (No Known Allergies)    Family History  Problem Relation Age of Onset  . Asthma Mother   . Emphysema Father   .  Colon cancer Neg Hx   . Stomach cancer Neg Hx   . Esophageal cancer Neg Hx   . Pancreatic cancer Neg Hx   . Liver disease Neg Hx     Social History   Socioeconomic History  . Marital status: Married    Spouse name: Not on file  . Number of children: 3  . Years of education: Not on file  . Highest education level: Not on file  Occupational History  . Occupation: Retired  Tobacco Use  . Smoking status: Never Smoker  . Smokeless tobacco: Current User    Types: Snuff  Vaping Use  . Vaping Use: Never used  Substance and Sexual Activity  . Alcohol use: No    Alcohol/week: 0.0 standard drinks  . Drug use: No  . Sexual  activity: Not Currently  Other Topics Concern  . Not on file  Social History Narrative   Never smoked cigs regularly but stopped all exposure 48s   Lives with wife   Social Determinants of Radio broadcast assistant Strain: Not on file  Food Insecurity: Not on file  Transportation Needs: Not on file  Physical Activity: Not on file  Stress: Not on file  Social Connections: Not on file  Intimate Partner Violence: Not on file    Review of Systems:    Constitutional: No weight loss, fever or chills Cardiovascular: No chest pain Respiratory: No SOB  Gastrointestinal: See HPI and otherwise negative   Physical Exam:  Vital signs: BP 114/70   Pulse (!) 54   Ht 5' 9.5" (1.765 m)   Wt 183 lb 9.6 oz (83.3 kg)   SpO2 98%   BMI 26.72 kg/m  Constitutional:   Pleasant Elderly Caucasian male appears to be in NAD, Well developed, Well nourished, alert and cooperative Respiratory: Respirations even and unlabored. Lungs clear to auscultation bilaterally.   No wheezes, crackles, or rhonchi.  Cardiovascular: Normal S1, S2. No MRG. Regular rate and rhythm. No peripheral edema, cyanosis or pallor.  Gastrointestinal:  Soft, nondistended, nontender. No rebound or guarding. Normal bowel sounds. No appreciable masses or hepatomegaly. Rectal:  Not performed.   Psychiatric:  Demonstrates good judgement and reason without abnormal affect or behaviors.  No recent labs.  Assessment: 1.  Collagenous colitis: Does well on 6 mg of Budesonide every day  Plan: 1.  Refilled Budesonide 3 mg, 2 tabs once daily #180 with 3 refills. 2.  Patient to follow in clinic with [place] in a year for further refills.  Ellouise Newer, PA-C [facility] 05/20/2020, 3:05 PM  Cc: Burnard Bunting, MD

## 2020-05-20 NOTE — Progress Notes (Signed)
Noted  

## 2020-07-09 ENCOUNTER — Other Ambulatory Visit: Payer: Self-pay | Admitting: Cardiovascular Disease

## 2020-07-26 DIAGNOSIS — H2512 Age-related nuclear cataract, left eye: Secondary | ICD-10-CM | POA: Diagnosis not present

## 2020-07-26 DIAGNOSIS — H02051 Trichiasis without entropian right upper eyelid: Secondary | ICD-10-CM | POA: Diagnosis not present

## 2020-08-02 ENCOUNTER — Ambulatory Visit (INDEPENDENT_AMBULATORY_CARE_PROVIDER_SITE_OTHER): Payer: Medicare HMO

## 2020-08-02 DIAGNOSIS — I442 Atrioventricular block, complete: Secondary | ICD-10-CM | POA: Diagnosis not present

## 2020-08-04 LAB — CUP PACEART REMOTE DEVICE CHECK
Battery Remaining Longevity: 96 mo
Battery Voltage: 3.02 V
Brady Statistic AS VP Percent: 90.41 %
Brady Statistic AS VS Percent: 0.02 %
Brady Statistic RV Percent Paced: 98.92 %
Date Time Interrogation Session: 20220504151646
Implantable Pulse Generator Implant Date: 20201030
Lead Channel Impedance Value: 730 Ohm
Lead Channel Pacing Threshold Amplitude: 0.375 V
Lead Channel Pacing Threshold Pulse Width: 0.24 ms
Lead Channel Sensing Intrinsic Amplitude: 27 mV
Lead Channel Setting Pacing Amplitude: 1 V
Lead Channel Setting Pacing Pulse Width: 0.24 ms
Lead Channel Setting Sensing Sensitivity: 2 mV

## 2020-08-05 ENCOUNTER — Ambulatory Visit: Payer: Medicare HMO | Admitting: Physician Assistant

## 2020-08-18 DIAGNOSIS — H2512 Age-related nuclear cataract, left eye: Secondary | ICD-10-CM | POA: Diagnosis not present

## 2020-08-18 DIAGNOSIS — H25812 Combined forms of age-related cataract, left eye: Secondary | ICD-10-CM | POA: Diagnosis not present

## 2020-08-18 DIAGNOSIS — H21562 Pupillary abnormality, left eye: Secondary | ICD-10-CM | POA: Diagnosis not present

## 2020-08-24 NOTE — Progress Notes (Signed)
Remote pacemaker transmission.   

## 2020-09-15 DIAGNOSIS — H6041 Cholesteatoma of right external ear: Secondary | ICD-10-CM | POA: Diagnosis not present

## 2020-09-15 DIAGNOSIS — H906 Mixed conductive and sensorineural hearing loss, bilateral: Secondary | ICD-10-CM | POA: Diagnosis not present

## 2020-09-15 DIAGNOSIS — H6121 Impacted cerumen, right ear: Secondary | ICD-10-CM | POA: Diagnosis not present

## 2020-09-20 DIAGNOSIS — I999 Unspecified disorder of circulatory system: Secondary | ICD-10-CM | POA: Diagnosis not present

## 2020-09-20 DIAGNOSIS — R69 Illness, unspecified: Secondary | ICD-10-CM | POA: Diagnosis not present

## 2020-09-20 DIAGNOSIS — I25810 Atherosclerosis of coronary artery bypass graft(s) without angina pectoris: Secondary | ICD-10-CM | POA: Diagnosis not present

## 2020-09-20 DIAGNOSIS — C4A30 Merkel cell carcinoma of unspecified part of face: Secondary | ICD-10-CM | POA: Diagnosis not present

## 2020-09-20 DIAGNOSIS — E1169 Type 2 diabetes mellitus with other specified complication: Secondary | ICD-10-CM | POA: Diagnosis not present

## 2020-09-23 ENCOUNTER — Other Ambulatory Visit: Payer: Self-pay | Admitting: Otolaryngology

## 2020-09-23 DIAGNOSIS — H7190 Unspecified cholesteatoma, unspecified ear: Secondary | ICD-10-CM

## 2020-09-30 DIAGNOSIS — M25562 Pain in left knee: Secondary | ICD-10-CM | POA: Diagnosis not present

## 2020-09-30 DIAGNOSIS — M1712 Unilateral primary osteoarthritis, left knee: Secondary | ICD-10-CM | POA: Diagnosis not present

## 2020-09-30 DIAGNOSIS — M25462 Effusion, left knee: Secondary | ICD-10-CM | POA: Diagnosis not present

## 2020-10-08 ENCOUNTER — Other Ambulatory Visit: Payer: Self-pay | Admitting: Cardiovascular Disease

## 2020-10-19 DIAGNOSIS — R300 Dysuria: Secondary | ICD-10-CM | POA: Diagnosis not present

## 2020-11-01 ENCOUNTER — Ambulatory Visit (INDEPENDENT_AMBULATORY_CARE_PROVIDER_SITE_OTHER): Payer: Medicare HMO

## 2020-11-01 DIAGNOSIS — I442 Atrioventricular block, complete: Secondary | ICD-10-CM

## 2020-11-02 LAB — CUP PACEART REMOTE DEVICE CHECK
Battery Remaining Longevity: 96 mo
Battery Voltage: 3.02 V
Brady Statistic AS VP Percent: 90.62 %
Brady Statistic AS VS Percent: 0.02 %
Brady Statistic RV Percent Paced: 98.99 %
Date Time Interrogation Session: 20220801134146
Implantable Pulse Generator Implant Date: 20201030
Lead Channel Impedance Value: 750 Ohm
Lead Channel Pacing Threshold Amplitude: 0.375 V
Lead Channel Pacing Threshold Pulse Width: 0.24 ms
Lead Channel Sensing Intrinsic Amplitude: 26.775 mV
Lead Channel Setting Pacing Amplitude: 1 V
Lead Channel Setting Pacing Pulse Width: 0.24 ms
Lead Channel Setting Sensing Sensitivity: 2 mV

## 2020-11-09 DIAGNOSIS — I4430 Unspecified atrioventricular block: Secondary | ICD-10-CM | POA: Diagnosis not present

## 2020-11-09 DIAGNOSIS — J309 Allergic rhinitis, unspecified: Secondary | ICD-10-CM | POA: Diagnosis not present

## 2020-11-09 DIAGNOSIS — E785 Hyperlipidemia, unspecified: Secondary | ICD-10-CM | POA: Diagnosis not present

## 2020-11-09 DIAGNOSIS — I2510 Atherosclerotic heart disease of native coronary artery without angina pectoris: Secondary | ICD-10-CM | POA: Diagnosis not present

## 2020-11-09 DIAGNOSIS — I10 Essential (primary) hypertension: Secondary | ICD-10-CM | POA: Diagnosis not present

## 2020-11-09 DIAGNOSIS — R69 Illness, unspecified: Secondary | ICD-10-CM | POA: Diagnosis not present

## 2020-11-09 DIAGNOSIS — H409 Unspecified glaucoma: Secondary | ICD-10-CM | POA: Diagnosis not present

## 2020-11-09 DIAGNOSIS — J449 Chronic obstructive pulmonary disease, unspecified: Secondary | ICD-10-CM | POA: Diagnosis not present

## 2020-11-09 DIAGNOSIS — Z794 Long term (current) use of insulin: Secondary | ICD-10-CM | POA: Diagnosis not present

## 2020-11-09 DIAGNOSIS — E1159 Type 2 diabetes mellitus with other circulatory complications: Secondary | ICD-10-CM | POA: Diagnosis not present

## 2020-11-24 NOTE — Progress Notes (Signed)
Remote pacemaker transmission.   

## 2020-12-17 ENCOUNTER — Other Ambulatory Visit: Payer: Self-pay | Admitting: Cardiovascular Disease

## 2021-01-06 ENCOUNTER — Other Ambulatory Visit: Payer: Self-pay | Admitting: Cardiovascular Disease

## 2021-01-11 DIAGNOSIS — D485 Neoplasm of uncertain behavior of skin: Secondary | ICD-10-CM | POA: Diagnosis not present

## 2021-01-11 DIAGNOSIS — L988 Other specified disorders of the skin and subcutaneous tissue: Secondary | ICD-10-CM | POA: Diagnosis not present

## 2021-01-14 ENCOUNTER — Telehealth: Payer: Self-pay | Admitting: Internal Medicine

## 2021-01-14 DIAGNOSIS — R159 Full incontinence of feces: Secondary | ICD-10-CM

## 2021-01-14 DIAGNOSIS — K5900 Constipation, unspecified: Secondary | ICD-10-CM

## 2021-01-14 DIAGNOSIS — K52831 Collagenous colitis: Secondary | ICD-10-CM

## 2021-01-14 NOTE — Telephone Encounter (Signed)
Spoke with patient's daughter, Jobe Gibbon, she is at the patient's home. Pt reported to her that he has been having leakage of his stool for 74-months, he reports symptoms started after his visit. Denies any abdominal pain or blood in the stool. No antibiotic use or new medications. Pt is currently on Budesonide 3 mg capsule - 2 capsules daily. Pt does not want to go anywhere because of the stool leakage. Jobe Gibbon requests that I call her back with recommendations. Please advise, thanks

## 2021-01-14 NOTE — Telephone Encounter (Signed)
Spoke with Jobe Gibbon in regards to Jennifer's recommendations. Jobe Gibbon states that patient lives an hour away and won't be able to come in today. She states that she will have one of her siblings bring the patient in early next week. She is aware that no app is necessary. Jobe Gibbon verbalized understanding of all information and had no concerns at the end of the call.

## 2021-01-14 NOTE — Telephone Encounter (Signed)
Inbound call from pt's daughter Eusebio Me requesting a call back stating that the pt has incontinences of his bowel. Her best contact is  [phone]. Please advise. Thank you.

## 2021-01-17 NOTE — Telephone Encounter (Signed)
Patients daughter called to cancel the Xray appt. Said the patient is now feeling better.

## 2021-01-17 NOTE — Telephone Encounter (Signed)
Please see note below regarding xray.

## 2021-01-20 DIAGNOSIS — I10 Essential (primary) hypertension: Secondary | ICD-10-CM | POA: Diagnosis not present

## 2021-01-20 DIAGNOSIS — Z125 Encounter for screening for malignant neoplasm of prostate: Secondary | ICD-10-CM | POA: Diagnosis not present

## 2021-01-20 DIAGNOSIS — E785 Hyperlipidemia, unspecified: Secondary | ICD-10-CM | POA: Diagnosis not present

## 2021-01-20 DIAGNOSIS — E1169 Type 2 diabetes mellitus with other specified complication: Secondary | ICD-10-CM | POA: Diagnosis not present

## 2021-01-24 DIAGNOSIS — Z1339 Encounter for screening examination for other mental health and behavioral disorders: Secondary | ICD-10-CM | POA: Diagnosis not present

## 2021-01-24 DIAGNOSIS — I25810 Atherosclerosis of coronary artery bypass graft(s) without angina pectoris: Secondary | ICD-10-CM | POA: Diagnosis not present

## 2021-01-24 DIAGNOSIS — Z1331 Encounter for screening for depression: Secondary | ICD-10-CM | POA: Diagnosis not present

## 2021-01-24 DIAGNOSIS — Z23 Encounter for immunization: Secondary | ICD-10-CM | POA: Diagnosis not present

## 2021-01-24 DIAGNOSIS — I999 Unspecified disorder of circulatory system: Secondary | ICD-10-CM | POA: Diagnosis not present

## 2021-01-24 DIAGNOSIS — R413 Other amnesia: Secondary | ICD-10-CM | POA: Diagnosis not present

## 2021-01-24 DIAGNOSIS — J449 Chronic obstructive pulmonary disease, unspecified: Secondary | ICD-10-CM | POA: Diagnosis not present

## 2021-01-24 DIAGNOSIS — I10 Essential (primary) hypertension: Secondary | ICD-10-CM | POA: Diagnosis not present

## 2021-01-24 DIAGNOSIS — R82998 Other abnormal findings in urine: Secondary | ICD-10-CM | POA: Diagnosis not present

## 2021-01-24 DIAGNOSIS — E785 Hyperlipidemia, unspecified: Secondary | ICD-10-CM | POA: Diagnosis not present

## 2021-01-24 DIAGNOSIS — Z008 Encounter for other general examination: Secondary | ICD-10-CM | POA: Diagnosis not present

## 2021-01-24 DIAGNOSIS — E1169 Type 2 diabetes mellitus with other specified complication: Secondary | ICD-10-CM | POA: Diagnosis not present

## 2021-01-31 ENCOUNTER — Ambulatory Visit (INDEPENDENT_AMBULATORY_CARE_PROVIDER_SITE_OTHER): Payer: Medicare HMO

## 2021-01-31 DIAGNOSIS — I442 Atrioventricular block, complete: Secondary | ICD-10-CM | POA: Diagnosis not present

## 2021-02-01 DIAGNOSIS — H02051 Trichiasis without entropian right upper eyelid: Secondary | ICD-10-CM | POA: Diagnosis not present

## 2021-02-01 LAB — CUP PACEART REMOTE DEVICE CHECK
Battery Remaining Longevity: 96 mo
Battery Voltage: 3.02 V
Brady Statistic AS VP Percent: 90.76 %
Brady Statistic AS VS Percent: 0.02 %
Brady Statistic RV Percent Paced: 99.05 %
Date Time Interrogation Session: 20221101144246
Implantable Pulse Generator Implant Date: 20201030
Lead Channel Impedance Value: 680 Ohm
Lead Channel Pacing Threshold Amplitude: 0.375 V
Lead Channel Pacing Threshold Pulse Width: 0.24 ms
Lead Channel Sensing Intrinsic Amplitude: 26.775 mV
Lead Channel Setting Pacing Amplitude: 0.875
Lead Channel Setting Pacing Pulse Width: 0.24 ms
Lead Channel Setting Sensing Sensitivity: 2 mV

## 2021-02-07 NOTE — Progress Notes (Signed)
Remote pacemaker transmission.   

## 2021-02-11 ENCOUNTER — Other Ambulatory Visit: Payer: Self-pay | Admitting: Cardiovascular Disease

## 2021-03-17 ENCOUNTER — Other Ambulatory Visit: Payer: Self-pay | Admitting: Cardiovascular Disease

## 2021-03-31 DIAGNOSIS — L570 Actinic keratosis: Secondary | ICD-10-CM | POA: Diagnosis not present

## 2021-03-31 DIAGNOSIS — C44229 Squamous cell carcinoma of skin of left ear and external auricular canal: Secondary | ICD-10-CM | POA: Diagnosis not present

## 2021-03-31 DIAGNOSIS — Z85828 Personal history of other malignant neoplasm of skin: Secondary | ICD-10-CM | POA: Diagnosis not present

## 2021-04-20 ENCOUNTER — Other Ambulatory Visit: Payer: Self-pay | Admitting: Cardiovascular Disease

## 2021-05-13 ENCOUNTER — Telehealth: Payer: Self-pay | Admitting: Internal Medicine

## 2021-05-13 NOTE — Telephone Encounter (Signed)
Pt returned call;  With pt on line contacted [employer] tech support.  Pt needs a new reader for his remote monitor.  They are mailing new device out, he should receive it in 7-10 days.

## 2021-05-13 NOTE — Telephone Encounter (Signed)
°  1. Has your device fired? no  2. Is you device beeping? \no  3. Are you experiencing draining or swelling at device site? no  4. Are you calling to see if we received your device transmission? yes  5. Have you passed out? No  Patient is sending a test transmission to see if his monitor is working     Please route to [employer]

## 2021-05-13 NOTE — Telephone Encounter (Signed)
Last transmission received 02/01/2021.  Attempted to reach patient, no answer/no VM on home phone or on cell phone.  Unable to leave message

## 2021-05-16 ENCOUNTER — Ambulatory Visit (INDEPENDENT_AMBULATORY_CARE_PROVIDER_SITE_OTHER): Payer: Medicare HMO

## 2021-05-16 DIAGNOSIS — I442 Atrioventricular block, complete: Secondary | ICD-10-CM

## 2021-05-16 LAB — CUP PACEART REMOTE DEVICE CHECK
Battery Remaining Longevity: 96 mo
Battery Voltage: 3.01 V
Brady Statistic AS VP Percent: 90.83 %
Brady Statistic AS VS Percent: 0.01 %
Brady Statistic RV Percent Paced: 99.12 %
Date Time Interrogation Session: 20230212223446
Implantable Pulse Generator Implant Date: 20201030
Lead Channel Impedance Value: 710 Ohm
Lead Channel Pacing Threshold Amplitude: 0.375 V
Lead Channel Pacing Threshold Pulse Width: 0.24 ms
Lead Channel Sensing Intrinsic Amplitude: 28.575 mV
Lead Channel Setting Pacing Amplitude: 0.875
Lead Channel Setting Pacing Pulse Width: 0.24 ms
Lead Channel Setting Sensing Sensitivity: 2 mV

## 2021-05-18 ENCOUNTER — Other Ambulatory Visit: Payer: Self-pay | Admitting: Cardiovascular Disease

## 2021-05-18 NOTE — Progress Notes (Signed)
Remote pacemaker transmission.   

## 2021-06-06 DIAGNOSIS — H02051 Trichiasis without entropian right upper eyelid: Secondary | ICD-10-CM | POA: Diagnosis not present

## 2021-06-06 DIAGNOSIS — Z961 Presence of intraocular lens: Secondary | ICD-10-CM | POA: Diagnosis not present

## 2021-06-06 DIAGNOSIS — H401132 Primary open-angle glaucoma, bilateral, moderate stage: Secondary | ICD-10-CM | POA: Diagnosis not present

## 2021-06-13 DIAGNOSIS — I25810 Atherosclerosis of coronary artery bypass graft(s) without angina pectoris: Secondary | ICD-10-CM | POA: Diagnosis not present

## 2021-06-13 DIAGNOSIS — I442 Atrioventricular block, complete: Secondary | ICD-10-CM | POA: Diagnosis not present

## 2021-06-13 DIAGNOSIS — E1169 Type 2 diabetes mellitus with other specified complication: Secondary | ICD-10-CM | POA: Diagnosis not present

## 2021-06-13 DIAGNOSIS — J449 Chronic obstructive pulmonary disease, unspecified: Secondary | ICD-10-CM | POA: Diagnosis not present

## 2021-06-13 DIAGNOSIS — I10 Essential (primary) hypertension: Secondary | ICD-10-CM | POA: Diagnosis not present

## 2021-06-13 DIAGNOSIS — I999 Unspecified disorder of circulatory system: Secondary | ICD-10-CM | POA: Diagnosis not present

## 2021-06-13 DIAGNOSIS — C4A30 Merkel cell carcinoma of unspecified part of face: Secondary | ICD-10-CM | POA: Diagnosis not present

## 2021-06-13 DIAGNOSIS — R69 Illness, unspecified: Secondary | ICD-10-CM | POA: Diagnosis not present

## 2021-06-23 ENCOUNTER — Other Ambulatory Visit: Payer: Self-pay | Admitting: Cardiovascular Disease

## 2021-06-28 ENCOUNTER — Other Ambulatory Visit: Payer: Self-pay | Admitting: Cardiovascular Disease

## 2021-06-30 NOTE — Progress Notes (Signed)
?Cardiology Clinic Note  ? ?Patient Name: Nathan Howell ?Date of Encounter: 07/01/2021 ? ?Primary Care Provider:  Burnard Bunting, MD ?Primary Cardiologist:  Shelva Majestic, MD ? ?Patient Profile  ?  ?87 y/o male with known history of CAD and on 02/24/2004 underwent CABG surgery x3 by Dr. Servando Snare (LIMA to the LAD, vein to the intermediate, and into the distal circumflex). [name] was nondominant and not bypassed. Additional problems include type 2 diabetes mellitus, hypertension, as well as hyperlipidemia. Nuclear perfusion study in November 2018 which remained low risk and showed normal perfusion and function.  EF was 59%. He had a syncopal spell and presented with complete heart block in October 2020.  He underwent successful implantation of a Medtronic Micra leadless pacemaker by Dr. Rayann Heman after initially having undergone temporary pacemaker insertion.  Last seen by Dr. Claiborne Billings on 11/11/2019. ? ?Past Medical History  ?  ?Past Medical History:  ?Diagnosis Date  ? Abdominal hernia   ? Asthma   ? Wert - HFA 50% 03/16/09 > 50% 05/13/10  ? Collagenous colitis   ? COPD (chronic obstructive pulmonary disease) ([place]) 2015  ? Coronary artery disease 2005  ? Diverticulosis   ? GERD (gastroesophageal reflux disease)   ? Glaucoma   ? History of stress test 02/2011  ? Which showed mild inferior thinning felt to be diaphragmatic attenuation artifact without ichemia. Poststress EF was 65%.  ? Hx of echocardiogram   ? EF>55% this showed moderate concentric LVH with normal systolic function. There was evidence for grade 1 diastolic relaxation abnormality with possible very small region of apical lateral hypokinesis.He did have moderate left atrial dilatation by volume assessment. There was mild mitral annular calcification with trace MR, trace TR, mild aortic valve sclerosis without stenosis    ? Hyperlipidemia   ? Hypertension   ? Rhinitis medicamentosa   ? per remote [place] w/u  ? S/P placement of cardiac pacemaker 01/31/19  leadless  02/01/2019  ? Type II diabetes mellitus ([place])   ? ?Past Surgical History:  ?Procedure Laterality Date  ? CARDIAC CATHETERIZATION  02/2004  ? Normal left ventricular function. Significant multivessel coronary artery disease corresponding to left main equivalent disease with diffuse tubular narrowing of 50% in the proximal left anterior descending, 905 in the ramus intermadius vessel and 80% in the most proximal circumflex veseel with 70% mid A-V groove circumflex stenosis.  ? CATARACT EXTRACTION W/ INTRAOCULAR LENS IMPLANT Right 2003   ? CORNEAL TRANSPLANT Right ~ 2004  ? CORONARY ANGIOPLASTY    ? CORONARY ARTERY BYPASS GRAFT    ? x3 By Dr Servando Snare with LIMA to the LAD, a vein to the intermediate, a vein to the distal circumflex. [name] was nondominant and not bypassed.  ? GLAUCOMA VALVE INSERTION Right   ? HAND SURGERY Right   ? "don't remember what was done"  ? INGUINAL HERNIA REPAIR  1999  ? "not sure which side"  ? KNEE ARTHROSCOPY Bilateral   ? PACEMAKER LEADLESS INSERTION N/A 01/31/2019  ? Procedure: PACEMAKER LEADLESS INSERTION;  Surgeon: Thompson Grayer, MD;  Location: [facility];  Service: Cardiovascular;  Laterality: N/A;  ? TEMPORARY PACEMAKER N/A 01/30/2019  ? Procedure: TEMPORARY PACEMAKER;  Surgeon: Burnell Blanks, MD;  Location: [facility];  Service: Cardiovascular;  Laterality: N/A;  ? ? ?Allergies ? ?Allergies  ?Allergen Reactions  ? Ranitidine Other (See Comments)  ? ? ?History of Present Illness  ?  ?[name] is an 87 year old male  we are seeing for ongoing assessment and management of CAD with hx of CABG X 3 in 2005, Hypertension,Hyperlipidemia. Medtronic PPM in situ in the setting of CHB, followed by EP.  ? ?He comes today with his son and grandson and is doing well.  He denies any complaints of chest pain, shortness of breath, dizziness, is not very active but is not completely sedentary.  He is medically compliant. ? ?Home Medications  ?  ?Current Outpatient  Medications  ?Medication Sig Dispense Refill  ? amLODipine (NORVASC) 5 MG tablet Take 5 mg by mouth daily.    ? aspirin 81 MG tablet Take 81 mg by mouth daily.     ? budesonide (ENTOCORT EC) 3 MG 24 hr capsule TAKE (2) CAPSULES DAILY. 180 capsule 3  ? donepezil (ARICEPT) 5 MG tablet Take 5 mg by mouth daily.    ? dorzolamide-timolol (COSOPT) 22.3-6.8 MG/ML ophthalmic solution Place 1 drop into the right eye 2 (two) times daily.    ? glipiZIDE (GLUCOTROL) 5 MG tablet Take 5 mg by mouth 2 (two) times daily before a meal.    ? Insulin Detemir (LEVEMIR FLEXTOUCH) 100 UNIT/ML Pen Inject 19 Units into the skin daily before breakfast.     ? losartan (COZAAR) 100 MG tablet Take 100 mg by mouth at bedtime.     ? metFORMIN (GLUCOPHAGE-XR) 500 MG 24 hr tablet Take 2 tablets (1,000 mg total) by mouth daily with breakfast.    ? metoprolol tartrate (LOPRESSOR) 25 MG tablet Keep scheduled follow up appointment. 15 tablet 0  ? rosuvastatin (CRESTOR) 20 MG tablet TAKE 1 TABLET DAILY 90 tablet 3  ? triamterene-hydrochlorothiazide (MAXZIDE-25) 37.5-25 MG tablet Take 1 tablet by mouth daily.    ? ?No current facility-administered medications for this visit.  ?  ? ?Family History  ?  ?Family History  ?Problem Relation Age of Onset  ? Asthma Mother   ? Emphysema Father   ? Colon cancer Neg Hx   ? Stomach cancer Neg Hx   ? Esophageal cancer Neg Hx   ? Pancreatic cancer Neg Hx   ? Liver disease Neg Hx   ? ?He indicated that his mother is deceased. He indicated that his father is deceased. He indicated that his sister is alive. He indicated that his brother is deceased. He indicated that the status of his neg hx is unknown. ? ?Social History  ?  ?Social History  ? ?Socioeconomic History  ? Marital status: Married  ?  Spouse name: Not on file  ? Number of children: 3  ? Years of education: Not on file  ? Highest education level: Not on file  ?Occupational History  ? Occupation: Retired  ?Tobacco Use  ? Smoking status: Never  ? Smokeless  tobacco: Current  ?  Types: Snuff  ?Vaping Use  ? Vaping Use: Never used  ?Substance and Sexual Activity  ? Alcohol use: No  ?  Alcohol/week: 0.0 standard drinks  ? Drug use: No  ? Sexual activity: Not Currently  ?Other Topics Concern  ? Not on file  ?Social History Narrative  ? Never smoked cigs regularly but stopped all exposure 1960s  ? Lives with wife  ? ?Social Determinants of Health  ? ?Financial Resource Strain: Not on file  ?Food Insecurity: Not on file  ?Transportation Needs: Not on file  ?Physical Activity: Not on file  ?Stress: Not on file  ?Social Connections: Not on file  ?Intimate Partner Violence: Not on file  ?  ? ?  Review of Systems  ?  ?General:  No chills, fever, night sweats or weight changes.  ?Cardiovascular:  No chest pain, dyspnea on exertion, edema, orthopnea, palpitations, paroxysmal nocturnal dyspnea. ?Dermatological: No rash, lesions/masses ?Respiratory: No cough, dyspnea ?Urologic: No hematuria, dysuria ?Abdominal:   No nausea, vomiting, diarrhea, bright red blood per rectum, melena, or hematemesis ?Neurologic:  No visual changes, wkns, changes in mental status. ?All other systems reviewed and are otherwise negative except as noted above. ? ? ?Physical Exam  ?  ?VS:  BP 100/60 (BP Location: Left Arm)   Pulse (!) 58   Ht '5\' 9"'$  (1.753 m)   Wt 173 lb 12.8 oz (78.8 kg)   SpO2 99%   BMI 25.67 kg/m?  , BMI Body mass index is 25.67 kg/m?. ?    ?GEN: Well nourished, well developed, in no acute distress. ?HEENT: normal. ?Neck: Supple, no JVD, carotid bruits, or masses. ?Cardiac: RRR, no murmurs, rubs, or gallops. No clubbing, cyanosis, edema.  Radials/DP/PT 2+ and equal bilaterally.  ?Respiratory:  Respirations regular and unlabored, clear to auscultation bilaterally. ?GI: Soft, nontender, nondistended, BS + x 4. ?MS: no deformity or atrophy. ?Skin: warm and dry, no rash. ?Neuro:  Strength and sensation are intact. ?Psych: Normal affect. ? ?Accessory Clinical Findings  ?  ?ECG personally  reviewed by me today- Paced rhythm heart rate of 58 bpm- No acute changes ? ?Lab Results  ?Component Value Date  ? WBC 6.2 01/31/2019  ? HGB 12.3 (L) 01/31/2019  ? HCT 35.5 (L) 01/31/2019  ? MCV 92.4 01/31/2019  ?

## 2021-07-01 ENCOUNTER — Ambulatory Visit: Payer: Medicare HMO | Admitting: Adult Health

## 2021-07-01 ENCOUNTER — Encounter: Payer: Self-pay | Admitting: Adult Health

## 2021-07-01 VITALS — BP 100/60 | HR 58 | Ht 69.0 in | Wt 173.8 lb

## 2021-07-01 DIAGNOSIS — E118 Type 2 diabetes mellitus with unspecified complications: Secondary | ICD-10-CM | POA: Diagnosis not present

## 2021-07-01 DIAGNOSIS — J441 Chronic obstructive pulmonary disease with (acute) exacerbation: Secondary | ICD-10-CM | POA: Diagnosis not present

## 2021-07-01 DIAGNOSIS — I2510 Atherosclerotic heart disease of native coronary artery without angina pectoris: Secondary | ICD-10-CM

## 2021-07-01 DIAGNOSIS — I10 Essential (primary) hypertension: Secondary | ICD-10-CM | POA: Diagnosis not present

## 2021-07-01 DIAGNOSIS — Z950 Presence of cardiac pacemaker: Secondary | ICD-10-CM | POA: Diagnosis not present

## 2021-07-01 DIAGNOSIS — E7800 Pure hypercholesterolemia, unspecified: Secondary | ICD-10-CM | POA: Diagnosis not present

## 2021-07-01 NOTE — Patient Instructions (Signed)
Medication Instructions:  ?No Changes ?*If you need a refill on your cardiac medications before your next appointment, please call your pharmacy* ? ? ?Lab Work: ?No Labs ?If you have labs (blood work) drawn today and your tests are completely normal, you will receive your results only by: ?MyChart Message (if you have MyChart) OR ?A paper copy in the mail ?If you have any lab test that is abnormal or we need to change your treatment, we will call you to review the results. ? ? ?Testing/Procedures: ?No Testing ? ? ?Follow-Up: ?At [facility], you and your health needs are our priority.  As part of our continuing mission to provide you with exceptional heart care, we have created designated Provider Care Teams.  These Care Teams include your primary Cardiologist (physician) and Advanced Practice Providers (APPs -  Physician Assistants and Nurse Practitioners) who all work together to provide you with the care you need, when you need it. ? ?We recommend signing up for the patient portal called "MyChart".  Sign up information is provided on this After Visit Summary.  MyChart is used to connect with patients for Virtual Visits (Telemedicine).  Patients are able to view lab/test results, encounter notes, upcoming appointments, etc.  Non-urgent messages can be sent to your provider as well.   ?To learn more about what you can do with MyChart, go to [email or web address].   ? ?Your next appointment:   ?1 year(s) ? ?The format for your next appointment:   ?In Person ? ?Provider:   ?Shelva Majestic, MD   ? ? ?  ?

## 2021-07-19 ENCOUNTER — Other Ambulatory Visit: Payer: Self-pay | Admitting: Physician Assistant

## 2021-08-06 DIAGNOSIS — H409 Unspecified glaucoma: Secondary | ICD-10-CM | POA: Diagnosis not present

## 2021-08-06 DIAGNOSIS — Z794 Long term (current) use of insulin: Secondary | ICD-10-CM | POA: Diagnosis not present

## 2021-08-06 DIAGNOSIS — R69 Illness, unspecified: Secondary | ICD-10-CM | POA: Diagnosis not present

## 2021-08-06 DIAGNOSIS — J309 Allergic rhinitis, unspecified: Secondary | ICD-10-CM | POA: Diagnosis not present

## 2021-08-06 DIAGNOSIS — Z008 Encounter for other general examination: Secondary | ICD-10-CM | POA: Diagnosis not present

## 2021-08-06 DIAGNOSIS — I2510 Atherosclerotic heart disease of native coronary artery without angina pectoris: Secondary | ICD-10-CM | POA: Diagnosis not present

## 2021-08-06 DIAGNOSIS — E11620 Type 2 diabetes mellitus with diabetic dermatitis: Secondary | ICD-10-CM | POA: Diagnosis not present

## 2021-08-06 DIAGNOSIS — Z720 Tobacco use: Secondary | ICD-10-CM | POA: Diagnosis not present

## 2021-08-06 DIAGNOSIS — Z7952 Long term (current) use of systemic steroids: Secondary | ICD-10-CM | POA: Diagnosis not present

## 2021-08-06 DIAGNOSIS — K219 Gastro-esophageal reflux disease without esophagitis: Secondary | ICD-10-CM | POA: Diagnosis not present

## 2021-08-06 DIAGNOSIS — I10 Essential (primary) hypertension: Secondary | ICD-10-CM | POA: Diagnosis not present

## 2021-08-06 DIAGNOSIS — Z7984 Long term (current) use of oral hypoglycemic drugs: Secondary | ICD-10-CM | POA: Diagnosis not present

## 2021-08-06 DIAGNOSIS — E785 Hyperlipidemia, unspecified: Secondary | ICD-10-CM | POA: Diagnosis not present

## 2021-08-11 DIAGNOSIS — H6041 Cholesteatoma of right external ear: Secondary | ICD-10-CM | POA: Diagnosis not present

## 2021-08-11 DIAGNOSIS — H906 Mixed conductive and sensorineural hearing loss, bilateral: Secondary | ICD-10-CM | POA: Diagnosis not present

## 2021-08-15 ENCOUNTER — Ambulatory Visit (INDEPENDENT_AMBULATORY_CARE_PROVIDER_SITE_OTHER): Payer: Medicare HMO

## 2021-08-15 DIAGNOSIS — I442 Atrioventricular block, complete: Secondary | ICD-10-CM

## 2021-08-16 LAB — CUP PACEART REMOTE DEVICE CHECK
Battery Remaining Longevity: 96 mo
Battery Voltage: 3.01 V
Brady Statistic AS VP Percent: 91.1 %
Brady Statistic AS VS Percent: 0.01 %
Brady Statistic RV Percent Paced: 99.18 %
Date Time Interrogation Session: 20230515144446
Implantable Pulse Generator Implant Date: 20201030
Lead Channel Impedance Value: 740 Ohm
Lead Channel Pacing Threshold Amplitude: 0.375 V
Lead Channel Pacing Threshold Pulse Width: 0.24 ms
Lead Channel Sensing Intrinsic Amplitude: 28.575 mV
Lead Channel Setting Pacing Amplitude: 0.875
Lead Channel Setting Pacing Pulse Width: 0.24 ms
Lead Channel Setting Sensing Sensitivity: 2 mV

## 2021-09-01 NOTE — Progress Notes (Signed)
Remote pacemaker transmission.   

## 2021-09-15 DIAGNOSIS — H02051 Trichiasis without entropian right upper eyelid: Secondary | ICD-10-CM | POA: Diagnosis not present

## 2021-09-16 ENCOUNTER — Encounter: Payer: Medicare HMO | Admitting: Internal Medicine

## 2021-09-23 ENCOUNTER — Encounter: Payer: Self-pay | Admitting: Student

## 2021-09-23 ENCOUNTER — Ambulatory Visit: Payer: Medicare HMO | Admitting: Student

## 2021-09-23 VITALS — BP 116/62 | HR 64 | Ht 71.0 in | Wt 169.6 lb

## 2021-09-23 DIAGNOSIS — I10 Essential (primary) hypertension: Secondary | ICD-10-CM | POA: Diagnosis not present

## 2021-09-23 DIAGNOSIS — I2510 Atherosclerotic heart disease of native coronary artery without angina pectoris: Secondary | ICD-10-CM | POA: Diagnosis not present

## 2021-09-23 DIAGNOSIS — I442 Atrioventricular block, complete: Secondary | ICD-10-CM

## 2021-09-23 LAB — CUP PACEART INCLINIC DEVICE CHECK
Battery Remaining Longevity: 96 mo
Battery Voltage: 3 V
Brady Statistic AS VP Percent: 91.22 %
Brady Statistic AS VS Percent: 0.01 %
Brady Statistic RV Percent Paced: 99.2 %
Date Time Interrogation Session: 20230623122335
Implantable Pulse Generator Implant Date: 20201030
Lead Channel Impedance Value: 770 Ohm
Lead Channel Pacing Threshold Amplitude: 0.375 V
Lead Channel Pacing Threshold Pulse Width: 0.24 ms
Lead Channel Sensing Intrinsic Amplitude: 25.988 mV
Lead Channel Setting Pacing Amplitude: 1 V
Lead Channel Setting Pacing Pulse Width: 0.24 ms
Lead Channel Setting Sensing Sensitivity: 2 mV

## 2021-09-30 DIAGNOSIS — E785 Hyperlipidemia, unspecified: Secondary | ICD-10-CM | POA: Diagnosis not present

## 2021-09-30 DIAGNOSIS — E1169 Type 2 diabetes mellitus with other specified complication: Secondary | ICD-10-CM | POA: Diagnosis not present

## 2021-09-30 DIAGNOSIS — I10 Essential (primary) hypertension: Secondary | ICD-10-CM | POA: Diagnosis not present

## 2021-09-30 DIAGNOSIS — J449 Chronic obstructive pulmonary disease, unspecified: Secondary | ICD-10-CM | POA: Diagnosis not present

## 2021-10-10 DIAGNOSIS — I999 Unspecified disorder of circulatory system: Secondary | ICD-10-CM | POA: Diagnosis not present

## 2021-10-10 DIAGNOSIS — E785 Hyperlipidemia, unspecified: Secondary | ICD-10-CM | POA: Diagnosis not present

## 2021-10-10 DIAGNOSIS — I442 Atrioventricular block, complete: Secondary | ICD-10-CM | POA: Diagnosis not present

## 2021-10-10 DIAGNOSIS — I10 Essential (primary) hypertension: Secondary | ICD-10-CM | POA: Diagnosis not present

## 2021-10-10 DIAGNOSIS — R69 Illness, unspecified: Secondary | ICD-10-CM | POA: Diagnosis not present

## 2021-10-10 DIAGNOSIS — I25810 Atherosclerosis of coronary artery bypass graft(s) without angina pectoris: Secondary | ICD-10-CM | POA: Diagnosis not present

## 2021-10-10 DIAGNOSIS — C4A30 Merkel cell carcinoma of unspecified part of face: Secondary | ICD-10-CM | POA: Diagnosis not present

## 2021-10-10 DIAGNOSIS — E1169 Type 2 diabetes mellitus with other specified complication: Secondary | ICD-10-CM | POA: Diagnosis not present

## 2021-10-14 ENCOUNTER — Other Ambulatory Visit: Payer: Self-pay | Admitting: Physician Assistant

## 2021-10-17 DIAGNOSIS — H43813 Vitreous degeneration, bilateral: Secondary | ICD-10-CM | POA: Diagnosis not present

## 2021-10-17 DIAGNOSIS — H02051 Trichiasis without entropian right upper eyelid: Secondary | ICD-10-CM | POA: Diagnosis not present

## 2021-10-17 DIAGNOSIS — S0501XA Injury of conjunctiva and corneal abrasion without foreign body, right eye, initial encounter: Secondary | ICD-10-CM | POA: Diagnosis not present

## 2021-11-10 DIAGNOSIS — B351 Tinea unguium: Secondary | ICD-10-CM | POA: Diagnosis not present

## 2021-11-10 DIAGNOSIS — E1142 Type 2 diabetes mellitus with diabetic polyneuropathy: Secondary | ICD-10-CM | POA: Diagnosis not present

## 2021-11-10 DIAGNOSIS — L03031 Cellulitis of right toe: Secondary | ICD-10-CM | POA: Diagnosis not present

## 2021-11-14 ENCOUNTER — Ambulatory Visit: Payer: Medicare HMO

## 2021-11-16 LAB — CUP PACEART REMOTE DEVICE CHECK
Battery Remaining Longevity: 96 mo
Battery Voltage: 3 V
Brady Statistic AS VP Percent: 93.79 %
Brady Statistic AS VS Percent: 0 %
Brady Statistic RV Percent Paced: 99.72 %
Date Time Interrogation Session: 20230815141646
Implantable Pulse Generator Implant Date: 20201030
Lead Channel Impedance Value: 710 Ohm
Lead Channel Pacing Threshold Amplitude: 0.5 V
Lead Channel Pacing Threshold Pulse Width: 0.24 ms
Lead Channel Sensing Intrinsic Amplitude: 26.663 mV
Lead Channel Setting Pacing Amplitude: 1 V
Lead Channel Setting Pacing Pulse Width: 0.24 ms
Lead Channel Setting Sensing Sensitivity: 2 mV

## 2021-11-17 ENCOUNTER — Other Ambulatory Visit: Payer: Self-pay | Admitting: Physician Assistant

## 2021-11-18 ENCOUNTER — Telehealth: Payer: Self-pay | Admitting: Physician Assistant

## 2021-11-18 NOTE — Telephone Encounter (Signed)
Patient daughter Shirlean Mylar called, requesting a call back to discuss patient medication.

## 2021-11-21 DIAGNOSIS — H02052 Trichiasis without entropian right lower eyelid: Secondary | ICD-10-CM | POA: Diagnosis not present

## 2021-11-21 DIAGNOSIS — H401113 Primary open-angle glaucoma, right eye, severe stage: Secondary | ICD-10-CM | POA: Diagnosis not present

## 2021-11-21 MED ORDER — BUDESONIDE 3 MG PO CPEP: ORAL_CAPSULE | ORAL | 1 refills | Status: DC

## 2021-11-21 NOTE — Telephone Encounter (Signed)
Sent in Budesonide for patient. He scheduled an appointment for 12/22/21.

## 2021-12-12 ENCOUNTER — Other Ambulatory Visit: Payer: Self-pay | Admitting: Cardiovascular Disease

## 2021-12-22 ENCOUNTER — Ambulatory Visit: Payer: Medicare HMO | Admitting: Physician Assistant

## 2021-12-22 ENCOUNTER — Encounter: Payer: Self-pay | Admitting: Physician Assistant

## 2021-12-22 VITALS — BP 118/64 | HR 59 | Ht 71.0 in | Wt 166.0 lb

## 2021-12-22 DIAGNOSIS — K52831 Collagenous colitis: Secondary | ICD-10-CM | POA: Diagnosis not present

## 2021-12-22 MED ORDER — BUDESONIDE 3 MG PO CPEP: ORAL_CAPSULE | ORAL | 3 refills | Status: DC

## 2021-12-22 NOTE — Patient Instructions (Signed)
If you are age 85 or older, your body mass index should be between 23-30. Your Body mass index is 23.15 kg/m. If this is out of the aforementioned range listed, please consider follow up with your Primary Care Provider.  If you are age 38 or younger, your body mass index should be between 19-25. Your Body mass index is 23.15 kg/m. If this is out of the aformentioned range listed, please consider follow up with your Primary Care Provider.   ________________________________________________________  The [facility] providers would like to encourage you to use [facility] to communicate with providers for non-urgent requests or questions.  Due to long hold times on the telephone, sending your provider a message by [facility] may be a faster and more efficient way to get a response.  Please allow 48 business hours for a response.  Please remember that this is for non-urgent requests.  _______________________________________________________   We have sent the following medications to your pharmacy for you to pick up at your convenience: Budesonide'  Follow up in 1 year.   It was a pleasure to see you today!  Thank you for trusting me with your gastrointestinal care!

## 2021-12-22 NOTE — Progress Notes (Signed)
Assessment and plans reviewed  

## 2021-12-22 NOTE — Progress Notes (Signed)
Chief Complaint: Follow-up collagenous colitis  HPI:    Nathan Howell is an 87 year old male with a past medical history of COPD and complete heart block status post pacemaker in 2020 (echo with LVEF 50-55%), known to Dr. Henrene Pastor, who returns to clinic today for follow-up of the collagenous colitis.    05/20/2020 patient seen in clinic for follow-up of collagenous colitis and was doing well on Budesonide 6 mg daily.    Today, patient presents to clinic accompanied by his daughter who is his caretaker as he has dementia.  She explains that he is doing well as long as he takes his Budesonide 6 mg daily.  Once every 4 to 5 months he will have about 4 to 5 days of diarrhea, but this seems to clear with Imodium.  They are not sure if it is related to what he is eating or not, but since it is so infrequent it does not bother him.    Denies fever, chills, weight loss or blood in his stool.  Past Medical History:  Diagnosis Date   Abdominal hernia    Asthma    Wert - HFA 50% 03/16/09 > 50% 05/13/10   Collagenous colitis    COPD (chronic obstructive pulmonary disease) ([place]) 2015   Coronary artery disease 2005   Diverticulosis    GERD (gastroesophageal reflux disease)    Glaucoma    History of stress test 02/2011   Which showed mild inferior thinning felt to be diaphragmatic attenuation artifact without ichemia. Poststress EF was 65%.   Hx of echocardiogram    EF>55% this showed moderate concentric LVH with normal systolic function. There was evidence for grade 1 diastolic relaxation abnormality with possible very small region of apical lateral hypokinesis.He did have moderate left atrial dilatation by volume assessment. There was mild mitral annular calcification with trace MR, trace TR, mild aortic valve sclerosis without stenosis     Hyperlipidemia    Hypertension    Rhinitis medicamentosa    per remote Minette Brine w/u   S/P placement of cardiac pacemaker 01/31/19 leadless  02/01/2019   Type II  diabetes mellitus [facility])     Past Surgical History:  Procedure Laterality Date   CARDIAC CATHETERIZATION  02/2004   Normal left ventricular function. Significant multivessel coronary artery disease corresponding to left main equivalent disease with diffuse tubular narrowing of 50% in the proximal left anterior descending, 905 in the ramus intermadius vessel and 80% in the most proximal circumflex veseel with 70% mid A-V groove circumflex stenosis.   CATARACT EXTRACTION W/ INTRAOCULAR LENS IMPLANT Right 2003    CORNEAL TRANSPLANT Right ~ 2004   CORONARY ANGIOPLASTY     CORONARY ARTERY BYPASS GRAFT     x3 By Dr Servando Snare with LIMA to the LAD, a vein to the intermediate, a vein to the distal circumflex. [name] was nondominant and not bypassed.   GLAUCOMA VALVE INSERTION Right    HAND SURGERY Right    "don't remember what was done"   [place]   "not sure which side"   KNEE ARTHROSCOPY Bilateral    PACEMAKER LEADLESS INSERTION N/A 01/31/2019   Procedure: PACEMAKER LEADLESS INSERTION;  Surgeon: Thompson Grayer, MD;  Location: [facility];  Service: Cardiovascular;  Laterality: N/A;   TEMPORARY PACEMAKER N/A 01/30/2019   Procedure: TEMPORARY PACEMAKER;  Surgeon: Burnell Blanks, MD;  Location: [facility];  Service: Cardiovascular;  Laterality: N/A;    Current Outpatient Medications  Medication Sig  Dispense Refill   amLODipine (NORVASC) 5 MG tablet Take 5 mg by mouth daily.     aspirin 81 MG tablet Take 81 mg by mouth daily.      budesonide (ENTOCORT EC) 3 MG 24 hr capsule TAKE (2) CAPSULES DAILY.** NO MORE REFILLS UNTIL PATIENT IS SEEN 60 capsule 1   donepezil (ARICEPT) 5 MG tablet Take 10 mg by mouth daily.     dorzolamide-timolol (COSOPT) 22.3-6.8 MG/ML ophthalmic solution Place 1 drop into the right eye 2 (two) times daily.     glipiZIDE (GLUCOTROL) 5 MG tablet Take 5 mg by mouth 2 (two) times daily before a meal.     Insulin Detemir (LEVEMIR  FLEXTOUCH) 100 UNIT/ML Pen Inject 19 Units into the skin daily before breakfast.      losartan (COZAAR) 100 MG tablet Take 100 mg by mouth at bedtime.      metFORMIN (GLUCOPHAGE-XR) 500 MG 24 hr tablet Take 2 tablets (1,000 mg total) by mouth daily with breakfast.     metoprolol tartrate (LOPRESSOR) 25 MG tablet Keep scheduled follow up appointment. 15 tablet 0   rosuvastatin (CRESTOR) 20 MG tablet TAKE 1 TABLET DAILY 90 tablet 3   triamterene-hydrochlorothiazide (MAXZIDE-25) 37.5-25 MG tablet Take 1 tablet by mouth daily.     No current facility-administered medications for this visit.    Allergies as of 12/22/2021 - Review Complete 12/22/2021  Allergen Reaction Noted   Ranitidine Other (See Comments) 06/07/2021    Family History  Problem Relation Age of Onset   Asthma Mother    Emphysema Father    Colon cancer Neg Hx    Stomach cancer Neg Hx    Esophageal cancer Neg Hx    Pancreatic cancer Neg Hx    Liver disease Neg Hx     Social History   Socioeconomic History   Marital status: Married    Spouse name: Not on file   Number of children: 3   Years of education: Not on file   Highest education level: Not on file  Occupational History   Occupation: Retired  Tobacco Use   Smoking status: Never   Smokeless tobacco: Current    Types: Snuff  Vaping Use   Vaping Use: Never used  Substance and Sexual Activity   Alcohol use: No    Alcohol/week: 0.0 standard drinks of alcohol   Drug use: No   Sexual activity: Not Currently  Other Topics Concern   Not on file  Social History Narrative   Never smoked cigs regularly but stopped all exposure 1960s   Lives with wife   Social Determinants of Radio broadcast assistant Strain: Not on file  Food Insecurity: Not on file  Transportation Needs: Not on file  Physical Activity: Not on file  Stress: Not on file  Social Connections: Not on file  Intimate Partner Violence: Not on file    Review of Systems:    Constitutional:  No weight loss, fever or chills Cardiovascular: No chest pain Respiratory: No SOB  Gastrointestinal: See HPI and otherwise negative   Physical Exam:  Vital signs: BP 118/64   Pulse (!) 59   Ht '5\' 11"'$  (1.803 m)   Wt 166 lb (75.3 kg)   BMI 23.15 kg/m    Constitutional:   Pleasant Caucasian male appears to be in NAD, Well developed, Well nourished, alert and cooperative Respiratory: Respirations even and unlabored. Lungs clear to auscultation bilaterally.   No wheezes, crackles, or rhonchi.  Cardiovascular: Normal S1, S2.  No MRG. Regular rate and rhythm. No peripheral edema, cyanosis or pallor.  Gastrointestinal:  Soft, nondistended, nontender. No rebound or guarding. Normal bowel sounds. No appreciable masses or hepatomegaly. Psychiatric: Oriented to person, place and time. Demonstrates good judgement and reason without abnormal affect or behaviors.  No recent labs or imaging.  Assessment: 1.  Collagenous colitis: Does well on 6 mg of Budesonide every day  Plan: 1.  Refilled Budesonide 3 mg, 2 tabs once daily #180 with 3 refills 2.  Patient to follow in clinic with [place] in a year for further refills.  Ellouise Newer, PA-C [facility] 12/22/2021, 10:27 AM  Cc: Burnard Bunting, MD

## 2022-01-23 DIAGNOSIS — I10 Essential (primary) hypertension: Secondary | ICD-10-CM | POA: Diagnosis not present

## 2022-01-23 DIAGNOSIS — E785 Hyperlipidemia, unspecified: Secondary | ICD-10-CM | POA: Diagnosis not present

## 2022-01-23 DIAGNOSIS — E1169 Type 2 diabetes mellitus with other specified complication: Secondary | ICD-10-CM | POA: Diagnosis not present

## 2022-01-23 DIAGNOSIS — R7989 Other specified abnormal findings of blood chemistry: Secondary | ICD-10-CM | POA: Diagnosis not present

## 2022-01-23 DIAGNOSIS — Z125 Encounter for screening for malignant neoplasm of prostate: Secondary | ICD-10-CM | POA: Diagnosis not present

## 2022-01-26 DIAGNOSIS — B351 Tinea unguium: Secondary | ICD-10-CM | POA: Diagnosis not present

## 2022-01-26 DIAGNOSIS — E1142 Type 2 diabetes mellitus with diabetic polyneuropathy: Secondary | ICD-10-CM | POA: Diagnosis not present

## 2022-01-26 DIAGNOSIS — L84 Corns and callosities: Secondary | ICD-10-CM | POA: Diagnosis not present

## 2022-01-26 DIAGNOSIS — M79676 Pain in unspecified toe(s): Secondary | ICD-10-CM | POA: Diagnosis not present

## 2022-01-30 DIAGNOSIS — E785 Hyperlipidemia, unspecified: Secondary | ICD-10-CM | POA: Diagnosis not present

## 2022-01-30 DIAGNOSIS — E1169 Type 2 diabetes mellitus with other specified complication: Secondary | ICD-10-CM | POA: Diagnosis not present

## 2022-01-30 DIAGNOSIS — Z23 Encounter for immunization: Secondary | ICD-10-CM | POA: Diagnosis not present

## 2022-01-30 DIAGNOSIS — Z0000 Encounter for general adult medical examination without abnormal findings: Secondary | ICD-10-CM | POA: Diagnosis not present

## 2022-01-30 DIAGNOSIS — I25810 Atherosclerosis of coronary artery bypass graft(s) without angina pectoris: Secondary | ICD-10-CM | POA: Diagnosis not present

## 2022-01-30 DIAGNOSIS — Z1339 Encounter for screening examination for other mental health and behavioral disorders: Secondary | ICD-10-CM | POA: Diagnosis not present

## 2022-01-30 DIAGNOSIS — I10 Essential (primary) hypertension: Secondary | ICD-10-CM | POA: Diagnosis not present

## 2022-01-30 DIAGNOSIS — J449 Chronic obstructive pulmonary disease, unspecified: Secondary | ICD-10-CM | POA: Diagnosis not present

## 2022-01-30 DIAGNOSIS — Z1331 Encounter for screening for depression: Secondary | ICD-10-CM | POA: Diagnosis not present

## 2022-02-03 DIAGNOSIS — R82998 Other abnormal findings in urine: Secondary | ICD-10-CM | POA: Diagnosis not present

## 2022-02-03 DIAGNOSIS — E1169 Type 2 diabetes mellitus with other specified complication: Secondary | ICD-10-CM | POA: Diagnosis not present

## 2022-02-03 DIAGNOSIS — I10 Essential (primary) hypertension: Secondary | ICD-10-CM | POA: Diagnosis not present

## 2022-02-03 DIAGNOSIS — E119 Type 2 diabetes mellitus without complications: Secondary | ICD-10-CM | POA: Diagnosis not present

## 2022-02-20 DIAGNOSIS — H4031X3 Glaucoma secondary to eye trauma, right eye, severe stage: Secondary | ICD-10-CM | POA: Diagnosis not present

## 2022-03-07 ENCOUNTER — Ambulatory Visit: Payer: Medicare HMO | Admitting: Cardiovascular Disease

## 2022-03-16 DIAGNOSIS — H02051 Trichiasis without entropian right upper eyelid: Secondary | ICD-10-CM | POA: Diagnosis not present

## 2022-03-16 DIAGNOSIS — H02052 Trichiasis without entropian right lower eyelid: Secondary | ICD-10-CM | POA: Diagnosis not present

## 2022-03-21 DIAGNOSIS — H16011 Central corneal ulcer, right eye: Secondary | ICD-10-CM | POA: Diagnosis not present

## 2022-03-22 DIAGNOSIS — H43811 Vitreous degeneration, right eye: Secondary | ICD-10-CM | POA: Diagnosis not present

## 2022-03-22 DIAGNOSIS — H4031X3 Glaucoma secondary to eye trauma, right eye, severe stage: Secondary | ICD-10-CM | POA: Diagnosis not present

## 2022-03-22 DIAGNOSIS — H5711 Ocular pain, right eye: Secondary | ICD-10-CM | POA: Diagnosis not present

## 2022-03-22 DIAGNOSIS — H5704 Mydriasis: Secondary | ICD-10-CM | POA: Diagnosis not present

## 2022-03-22 DIAGNOSIS — X58XXXD Exposure to other specified factors, subsequent encounter: Secondary | ICD-10-CM | POA: Diagnosis not present

## 2022-03-22 DIAGNOSIS — Z961 Presence of intraocular lens: Secondary | ICD-10-CM | POA: Diagnosis not present

## 2022-03-22 DIAGNOSIS — H18211 Corneal edema secondary to contact lens, right eye: Secondary | ICD-10-CM | POA: Diagnosis not present

## 2022-03-22 DIAGNOSIS — S0501XD Injury of conjunctiva and corneal abrasion without foreign body, right eye, subsequent encounter: Secondary | ICD-10-CM | POA: Diagnosis not present

## 2022-03-22 DIAGNOSIS — H18521 Epithelial (juvenile) corneal dystrophy, right eye: Secondary | ICD-10-CM | POA: Diagnosis not present

## 2022-03-22 DIAGNOSIS — H1189 Other specified disorders of conjunctiva: Secondary | ICD-10-CM | POA: Diagnosis not present

## 2022-03-29 DIAGNOSIS — S0501XA Injury of conjunctiva and corneal abrasion without foreign body, right eye, initial encounter: Secondary | ICD-10-CM | POA: Diagnosis not present

## 2022-03-29 DIAGNOSIS — X58XXXD Exposure to other specified factors, subsequent encounter: Secondary | ICD-10-CM | POA: Diagnosis not present

## 2022-03-29 DIAGNOSIS — H4031X0 Glaucoma secondary to eye trauma, right eye, stage unspecified: Secondary | ICD-10-CM | POA: Diagnosis not present

## 2022-03-29 DIAGNOSIS — S0501XD Injury of conjunctiva and corneal abrasion without foreign body, right eye, subsequent encounter: Secondary | ICD-10-CM | POA: Diagnosis not present

## 2022-03-29 DIAGNOSIS — H409 Unspecified glaucoma: Secondary | ICD-10-CM | POA: Diagnosis not present

## 2022-03-29 DIAGNOSIS — Z79899 Other long term (current) drug therapy: Secondary | ICD-10-CM | POA: Diagnosis not present

## 2022-03-29 DIAGNOSIS — Z961 Presence of intraocular lens: Secondary | ICD-10-CM | POA: Diagnosis not present

## 2022-04-07 DIAGNOSIS — S0501XA Injury of conjunctiva and corneal abrasion without foreign body, right eye, initial encounter: Secondary | ICD-10-CM | POA: Diagnosis not present

## 2022-04-07 DIAGNOSIS — H4031X0 Glaucoma secondary to eye trauma, right eye, stage unspecified: Secondary | ICD-10-CM | POA: Diagnosis not present

## 2022-04-07 DIAGNOSIS — S0501XD Injury of conjunctiva and corneal abrasion without foreign body, right eye, subsequent encounter: Secondary | ICD-10-CM | POA: Diagnosis not present

## 2022-04-07 DIAGNOSIS — H43812 Vitreous degeneration, left eye: Secondary | ICD-10-CM | POA: Diagnosis not present

## 2022-04-07 DIAGNOSIS — X58XXXD Exposure to other specified factors, subsequent encounter: Secondary | ICD-10-CM | POA: Diagnosis not present

## 2022-04-07 DIAGNOSIS — Z961 Presence of intraocular lens: Secondary | ICD-10-CM | POA: Diagnosis not present

## 2022-04-07 DIAGNOSIS — H02053 Trichiasis without entropian right eye, unspecified eyelid: Secondary | ICD-10-CM | POA: Diagnosis not present

## 2022-04-12 ENCOUNTER — Ambulatory Visit (INDEPENDENT_AMBULATORY_CARE_PROVIDER_SITE_OTHER): Payer: Medicare HMO

## 2022-04-12 DIAGNOSIS — I442 Atrioventricular block, complete: Secondary | ICD-10-CM | POA: Diagnosis not present

## 2022-04-12 LAB — CUP PACEART REMOTE DEVICE CHECK
Battery Remaining Longevity: 94 mo
Battery Voltage: 2.99 V
Brady Statistic AS VP Percent: 93.46 %
Brady Statistic AS VS Percent: 0 %
Brady Statistic RV Percent Paced: 99.71 %
Date Time Interrogation Session: 20240110183046
Implantable Pulse Generator Implant Date: 20201030
Lead Channel Impedance Value: 820 Ohm
Lead Channel Pacing Threshold Amplitude: 0.5 V
Lead Channel Pacing Threshold Pulse Width: 0.24 ms
Lead Channel Sensing Intrinsic Amplitude: 28.575 mV
Lead Channel Setting Pacing Amplitude: 1 V
Lead Channel Setting Pacing Pulse Width: 0.24 ms
Lead Channel Setting Sensing Sensitivity: 2 mV

## 2022-04-13 ENCOUNTER — Inpatient Hospital Stay (HOSPITAL_BASED_OUTPATIENT_CLINIC_OR_DEPARTMENT_OTHER)
Admission: EM | Admit: 2022-04-13 | Discharge: 2022-04-25 | DRG: 071 | Disposition: A | Discharge status: Skilled Nursing Facility | Payer: Medicare HMO | Attending: Internal Medicine | Admitting: Internal Medicine

## 2022-04-13 ENCOUNTER — Encounter (HOSPITAL_COMMUNITY): Payer: Self-pay

## 2022-04-13 ENCOUNTER — Emergency Department (HOSPITAL_BASED_OUTPATIENT_CLINIC_OR_DEPARTMENT_OTHER): Payer: Medicare HMO

## 2022-04-13 ENCOUNTER — Other Ambulatory Visit: Payer: Self-pay

## 2022-04-13 ENCOUNTER — Encounter (HOSPITAL_COMMUNITY): Payer: Self-pay | Admitting: Emergency Medicine

## 2022-04-13 ENCOUNTER — Ambulatory Visit: Payer: Medicare HMO | Admitting: Nurse Practitioner

## 2022-04-13 DIAGNOSIS — R69 Illness, unspecified: Secondary | ICD-10-CM | POA: Diagnosis not present

## 2022-04-13 DIAGNOSIS — Z947 Corneal transplant status: Secondary | ICD-10-CM

## 2022-04-13 DIAGNOSIS — E162 Hypoglycemia, unspecified: Secondary | ICD-10-CM | POA: Diagnosis present

## 2022-04-13 DIAGNOSIS — Z794 Long term (current) use of insulin: Secondary | ICD-10-CM | POA: Diagnosis not present

## 2022-04-13 DIAGNOSIS — E1165 Type 2 diabetes mellitus with hyperglycemia: Secondary | ICD-10-CM | POA: Diagnosis not present

## 2022-04-13 DIAGNOSIS — T502X5A Adverse effect of carbonic-anhydrase inhibitors, benzothiadiazides and other diuretics, initial encounter: Secondary | ICD-10-CM | POA: Diagnosis present

## 2022-04-13 DIAGNOSIS — K219 Gastro-esophageal reflux disease without esophagitis: Secondary | ICD-10-CM | POA: Diagnosis present

## 2022-04-13 DIAGNOSIS — Z751 Person awaiting admission to adequate facility elsewhere: Secondary | ICD-10-CM

## 2022-04-13 DIAGNOSIS — E871 Hypo-osmolality and hyponatremia: Secondary | ICD-10-CM | POA: Diagnosis present

## 2022-04-13 DIAGNOSIS — I442 Atrioventricular block, complete: Secondary | ICD-10-CM | POA: Diagnosis not present

## 2022-04-13 DIAGNOSIS — R5381 Other malaise: Secondary | ICD-10-CM | POA: Diagnosis present

## 2022-04-13 DIAGNOSIS — M47812 Spondylosis without myelopathy or radiculopathy, cervical region: Secondary | ICD-10-CM | POA: Diagnosis not present

## 2022-04-13 DIAGNOSIS — N179 Acute kidney failure, unspecified: Secondary | ICD-10-CM | POA: Diagnosis not present

## 2022-04-13 DIAGNOSIS — E876 Hypokalemia: Secondary | ICD-10-CM | POA: Diagnosis not present

## 2022-04-13 DIAGNOSIS — D638 Anemia in other chronic diseases classified elsewhere: Secondary | ICD-10-CM | POA: Diagnosis present

## 2022-04-13 DIAGNOSIS — E11649 Type 2 diabetes mellitus with hypoglycemia without coma: Secondary | ICD-10-CM | POA: Diagnosis not present

## 2022-04-13 DIAGNOSIS — E119 Type 2 diabetes mellitus without complications: Secondary | ICD-10-CM | POA: Diagnosis not present

## 2022-04-13 DIAGNOSIS — E785 Hyperlipidemia, unspecified: Secondary | ICD-10-CM | POA: Diagnosis not present

## 2022-04-13 DIAGNOSIS — G319 Degenerative disease of nervous system, unspecified: Secondary | ICD-10-CM | POA: Diagnosis present

## 2022-04-13 DIAGNOSIS — I110 Hypertensive heart disease with heart failure: Secondary | ICD-10-CM | POA: Diagnosis present

## 2022-04-13 DIAGNOSIS — Z7984 Long term (current) use of oral hypoglycemic drugs: Secondary | ICD-10-CM

## 2022-04-13 DIAGNOSIS — H409 Unspecified glaucoma: Secondary | ICD-10-CM | POA: Diagnosis present

## 2022-04-13 DIAGNOSIS — R627 Adult failure to thrive: Secondary | ICD-10-CM | POA: Diagnosis not present

## 2022-04-13 DIAGNOSIS — Z950 Presence of cardiac pacemaker: Secondary | ICD-10-CM

## 2022-04-13 DIAGNOSIS — G9341 Metabolic encephalopathy: Secondary | ICD-10-CM | POA: Diagnosis not present

## 2022-04-13 DIAGNOSIS — Z720 Tobacco use: Secondary | ICD-10-CM

## 2022-04-13 DIAGNOSIS — J449 Chronic obstructive pulmonary disease, unspecified: Secondary | ICD-10-CM | POA: Diagnosis present

## 2022-04-13 DIAGNOSIS — Z79899 Other long term (current) drug therapy: Secondary | ICD-10-CM

## 2022-04-13 DIAGNOSIS — S0500XA Injury of conjunctiva and corneal abrasion without foreign body, unspecified eye, initial encounter: Secondary | ICD-10-CM | POA: Diagnosis not present

## 2022-04-13 DIAGNOSIS — Z888 Allergy status to other drugs, medicaments and biological substances status: Secondary | ICD-10-CM

## 2022-04-13 DIAGNOSIS — K52831 Collagenous colitis: Secondary | ICD-10-CM | POA: Diagnosis present

## 2022-04-13 DIAGNOSIS — S0990XA Unspecified injury of head, initial encounter: Secondary | ICD-10-CM | POA: Diagnosis not present

## 2022-04-13 DIAGNOSIS — N19 Unspecified kidney failure: Secondary | ICD-10-CM | POA: Diagnosis present

## 2022-04-13 DIAGNOSIS — Z825 Family history of asthma and other chronic lower respiratory diseases: Secondary | ICD-10-CM

## 2022-04-13 DIAGNOSIS — E878 Other disorders of electrolyte and fluid balance, not elsewhere classified: Secondary | ICD-10-CM | POA: Diagnosis not present

## 2022-04-13 DIAGNOSIS — Z961 Presence of intraocular lens: Secondary | ICD-10-CM | POA: Diagnosis present

## 2022-04-13 DIAGNOSIS — I5032 Chronic diastolic (congestive) heart failure: Secondary | ICD-10-CM | POA: Diagnosis present

## 2022-04-13 DIAGNOSIS — Z7951 Long term (current) use of inhaled steroids: Secondary | ICD-10-CM

## 2022-04-13 DIAGNOSIS — I10 Essential (primary) hypertension: Secondary | ICD-10-CM | POA: Diagnosis present

## 2022-04-13 DIAGNOSIS — E861 Hypovolemia: Secondary | ICD-10-CM | POA: Diagnosis not present

## 2022-04-13 DIAGNOSIS — R451 Restlessness and agitation: Secondary | ICD-10-CM | POA: Diagnosis not present

## 2022-04-13 DIAGNOSIS — W19XXXA Unspecified fall, initial encounter: Secondary | ICD-10-CM | POA: Diagnosis not present

## 2022-04-13 DIAGNOSIS — Z602 Problems related to living alone: Secondary | ICD-10-CM | POA: Diagnosis present

## 2022-04-13 DIAGNOSIS — F0390 Unspecified dementia without behavioral disturbance: Secondary | ICD-10-CM | POA: Diagnosis present

## 2022-04-13 DIAGNOSIS — J310 Chronic rhinitis: Secondary | ICD-10-CM | POA: Diagnosis present

## 2022-04-13 DIAGNOSIS — E782 Mixed hyperlipidemia: Secondary | ICD-10-CM

## 2022-04-13 DIAGNOSIS — Z951 Presence of aortocoronary bypass graft: Secondary | ICD-10-CM | POA: Diagnosis not present

## 2022-04-13 DIAGNOSIS — I2510 Atherosclerotic heart disease of native coronary artery without angina pectoris: Secondary | ICD-10-CM | POA: Diagnosis present

## 2022-04-13 DIAGNOSIS — R296 Repeated falls: Secondary | ICD-10-CM | POA: Diagnosis present

## 2022-04-13 DIAGNOSIS — Z7982 Long term (current) use of aspirin: Secondary | ICD-10-CM

## 2022-04-13 DIAGNOSIS — R4182 Altered mental status, unspecified: Secondary | ICD-10-CM | POA: Diagnosis not present

## 2022-04-13 LAB — URINALYSIS, ROUTINE W REFLEX MICROSCOPIC
Bilirubin Urine: NEGATIVE
Glucose, UA: 100 mg/dL — AB
Hgb urine dipstick: NEGATIVE
Ketones, ur: NEGATIVE mg/dL
Leukocytes,Ua: NEGATIVE
Nitrite: NEGATIVE
Protein, ur: NEGATIVE mg/dL
Specific Gravity, Urine: 1.015 (ref 1.005–1.030)
pH: 7 (ref 5.0–8.0)

## 2022-04-13 LAB — CBC WITH DIFFERENTIAL/PLATELET
Abs Immature Granulocytes: 0.06 10*3/uL (ref 0.00–0.07)
Basophils Absolute: 0 10*3/uL (ref 0.0–0.1)
Basophils Relative: 0 %
Eosinophils Absolute: 0.1 10*3/uL (ref 0.0–0.5)
Eosinophils Relative: 1 %
HCT: 34.4 % — ABNORMAL LOW (ref 39.0–52.0)
Hemoglobin: 12.7 g/dL — ABNORMAL LOW (ref 13.0–17.0)
Immature Granulocytes: 1 %
Lymphocytes Relative: 23 %
Lymphs Abs: 1.9 10*3/uL (ref 0.7–4.0)
MCH: 32.4 pg (ref 26.0–34.0)
MCHC: 36.9 g/dL — ABNORMAL HIGH (ref 30.0–36.0)
MCV: 87.8 fL (ref 80.0–100.0)
Monocytes Absolute: 0.8 10*3/uL (ref 0.1–1.0)
Monocytes Relative: 10 %
Neutro Abs: 5.5 10*3/uL (ref 1.7–7.7)
Neutrophils Relative %: 65 %
Platelets: 292 10*3/uL (ref 150–400)
RBC: 3.92 MIL/uL — ABNORMAL LOW (ref 4.22–5.81)
RDW: 12.2 % (ref 11.5–15.5)
WBC: 8.3 10*3/uL (ref 4.0–10.5)
nRBC: 0 % (ref 0.0–0.2)

## 2022-04-13 LAB — BASIC METABOLIC PANEL
Anion gap: 11 (ref 5–15)
Anion gap: 11 (ref 5–15)
BUN: 22 mg/dL (ref 8–23)
BUN: 19 mg/dL (ref 8–23)
CO2: 23 mmol/L (ref 22–32)
CO2: 22 mmol/L (ref 22–32)
Calcium: 9.2 mg/dL (ref 8.9–10.3)
Calcium: 8.7 mg/dL — ABNORMAL LOW (ref 8.9–10.3)
Chloride: 84 mmol/L — ABNORMAL LOW (ref 98–111)
Chloride: 87 mmol/L — ABNORMAL LOW (ref 98–111)
Creatinine, Ser: 0.87 mg/dL (ref 0.61–1.24)
Creatinine, Ser: 0.85 mg/dL (ref 0.61–1.24)
GFR, Estimated: >60 mL/min (ref >60)
GFR, Estimated: >60 mL/min (ref >60)
Glucose, Bld: 47 mg/dL — ABNORMAL LOW (ref 70–99)
Glucose, Bld: 67 mg/dL — ABNORMAL LOW (ref 70–99)
Potassium: 3.6 mmol/L (ref 3.5–5.1)
Potassium: 3.4 mmol/L — ABNORMAL LOW (ref 3.5–5.1)
Sodium: 118 mmol/L — CL (ref 135–145)
Sodium: 120 mmol/L — ABNORMAL LOW (ref 135–145)

## 2022-04-13 LAB — OSMOLALITY, URINE: Osmolality, Ur: 408 mOsm/kg (ref 300–900)

## 2022-04-13 LAB — CBG MONITORING, ED
Glucose-Capillary: 192 mg/dL — ABNORMAL HIGH (ref 70–99)
Glucose-Capillary: 42 mg/dL — CL (ref 70–99)
Glucose-Capillary: 51 mg/dL — ABNORMAL LOW (ref 70–99)
Glucose-Capillary: 88 mg/dL (ref 70–99)

## 2022-04-13 MED ORDER — SODIUM CHLORIDE 0.9 % IV BOLUS
1000.0000 mL | Freq: Once | INTRAVENOUS | Status: AC
  Administered 2022-04-13: 1000 mL via INTRAVENOUS

## 2022-04-13 MED ORDER — ACETAMINOPHEN 325 MG PO TABS
650.0000 mg | ORAL_TABLET | Freq: Four times a day (QID) | ORAL | Status: DC | PRN
  Administered 2022-04-14 – 2022-04-18 (×3): 650 mg via ORAL
  Filled 2022-04-13 (×3): qty 2

## 2022-04-13 MED ORDER — DEXTROSE 50 % IV SOLN
1.0000 | Freq: Once | INTRAVENOUS | Status: AC
  Administered 2022-04-13: 50 mL via INTRAVENOUS
  Filled 2022-04-13: qty 50

## 2022-04-13 MED ORDER — MELATONIN 3 MG PO TABS
3.0000 mg | ORAL_TABLET | Freq: Every evening | ORAL | Status: DC | PRN
  Administered 2022-04-14 – 2022-04-18 (×5): 3 mg via ORAL
  Filled 2022-04-13 (×5): qty 1

## 2022-04-13 MED ORDER — DEXTROSE 50 % IV SOLN
1.0000 | INTRAVENOUS | Status: DC | PRN
  Administered 2022-04-13: 50 mL via INTRAVENOUS
  Filled 2022-04-13: qty 50

## 2022-04-13 MED ORDER — ACETAMINOPHEN 650 MG RE SUPP: 650.0000 mg | Freq: Four times a day (QID) | RECTAL | Status: DC | PRN

## 2022-04-13 NOTE — ED Notes (Signed)
Son in aware of pending transport to [facility] ED and gives consent

## 2022-04-13 NOTE — ED Notes (Signed)
Report given to [facility] at [facility] ED

## 2022-04-13 NOTE — ED Notes (Signed)
Patient transported to CT 

## 2022-04-13 NOTE — ED Notes (Signed)
Seizure pads placed on stretcher

## 2022-04-13 NOTE — ED Notes (Signed)
Purple bruising noted to left elbow. Swelling noted

## 2022-04-13 NOTE — ED Notes (Signed)
ED TO INPATIENT HANDOFF REPORT  ED Nurse Name and Phone #:  Shirlean Mylar RN  S Name/Age/Gender Nathan Howell 88 y.o. male Room/Bed: MH09/MH09  Code Status   Code Status: Prior  Home/SNF/Other Home Patient oriented to: self and situation Is this baseline? No   Triage Complete: Triage complete  Chief Complaint FALL/  SOB,  MENTAL STATUS,  FACIAL INJURIES,  ARM PAIN  Triage Note Reported fall occurred on Tuesday while taking out the trash; unwitnessed fall. On the ground for 15 mins; + head injury; left elbow and laceration on left eyebrow. denies LOC when asked. Family at bedside reports hx of dementia. Concerns for decline in reflexes,  understanding, and memory.    Allergies Allergies  Allergen Reactions   Ranitidine Other (See Comments)    Level of Care/Admitting Diagnosis ED Disposition     None       B Medical/Surgery History Past Medical History:  Diagnosis Date   Abdominal hernia    Asthma    [place] - HFA 50% 03/16/09 > 50% 05/13/10   Collagenous colitis    COPD (chronic obstructive pulmonary disease) ([place]) 2015   Coronary artery disease 2005   Diverticulosis    GERD (gastroesophageal reflux disease)    Glaucoma    History of stress test 02/2011   Which showed mild inferior thinning felt to be diaphragmatic attenuation artifact without ichemia. Poststress EF was 65%.   Hx of echocardiogram    EF>55% this showed moderate concentric LVH with normal systolic function. There was evidence for grade 1 diastolic relaxation abnormality with possible very small region of apical lateral hypokinesis.He did have moderate left atrial dilatation by volume assessment. There was mild mitral annular calcification with trace MR, trace TR, mild aortic valve sclerosis without stenosis     Hyperlipidemia    Hypertension    Rhinitis medicamentosa    per remote Minette Brine w/u   S/P placement of cardiac pacemaker 01/31/19 leadless  02/01/2019   Type II diabetes mellitus [facility])     Past Surgical History:  Procedure Laterality Date   CARDIAC CATHETERIZATION  02/2004   Normal left ventricular function. Significant multivessel coronary artery disease corresponding to left main equivalent disease with diffuse tubular narrowing of 50% in the proximal left anterior descending, 905 in the ramus intermadius vessel and 80% in the most proximal circumflex veseel with 70% mid A-V groove circumflex stenosis.   CATARACT EXTRACTION W/ INTRAOCULAR LENS IMPLANT Right 2003    CORNEAL TRANSPLANT Right ~ 2004   CORONARY ANGIOPLASTY     CORONARY ARTERY BYPASS GRAFT     x3 By Dr Servando Snare with LIMA to the LAD, a vein to the intermediate, a vein to the distal circumflex. [name] was nondominant and not bypassed.   GLAUCOMA VALVE INSERTION Right    HAND SURGERY Right    "don't remember what was done"   [place]   "not sure which side"   KNEE ARTHROSCOPY Bilateral    PACEMAKER LEADLESS INSERTION N/A 01/31/2019   Procedure: PACEMAKER LEADLESS INSERTION;  Surgeon: Thompson Grayer, MD;  Location: [place] [facility];  Service: Cardiovascular;  Laterality: N/A;   TEMPORARY PACEMAKER N/A 01/30/2019   Procedure: TEMPORARY PACEMAKER;  Surgeon: Burnell Blanks, MD;  Location: [place] [facility];  Service: Cardiovascular;  Laterality: N/A;     A IV Location/Drains/Wounds Patient Lines/Drains/Airways Status     Active Line/Drains/Airways     Name Placement date Placement time Site Days   Peripheral IV  04/13/22 20 G Right Antecubital 04/13/22  1457  Antecubital  less than 1            Intake/Output Last 24 hours  Intake/Output Summary (Last 24 hours) at 04/13/2022 1858 Last data filed at 04/13/2022 1850 Gross per 24 hour  Intake 1003.49 ml  Output --  Net 1003.49 ml    Labs/Imaging Results for orders placed or performed during the hospital encounter of 04/13/22 (from the past 48 hour(s))  CBC with Differential     Status: Abnormal   Collection Time:  04/13/22  2:46 PM  Result Value Ref Range   WBC 8.3 4.0 - 10.5 K/uL   RBC 3.92 (L) 4.22 - 5.81 MIL/uL   Hemoglobin 12.7 (L) 13.0 - 17.0 g/dL   HCT 34.4 (L) 39.0 - 52.0 %   MCV 87.8 80.0 - 100.0 fL   MCH 32.4 26.0 - 34.0 pg   MCHC 36.9 (H) 30.0 - 36.0 g/dL   RDW 12.2 11.5 - 15.5 %   Platelets 292 150 - 400 K/uL   nRBC 0.0 0.0 - 0.2 %   Neutrophils Relative % 65 %   Neutro Abs 5.5 1.7 - 7.7 K/uL   Lymphocytes Relative 23 %   Lymphs Abs 1.9 0.7 - 4.0 K/uL   Monocytes Relative 10 %   Monocytes Absolute 0.8 0.1 - 1.0 K/uL   Eosinophils Relative 1 %   Eosinophils Absolute 0.1 0.0 - 0.5 K/uL   Basophils Relative 0 %   Basophils Absolute 0.0 0.0 - 0.1 K/uL   Immature Granulocytes 1 %   Abs Immature Granulocytes 0.06 0.00 - 0.07 K/uL    Comment: Performed at [facility], [address] [place], [place] [address]  Basic metabolic panel     Status: Abnormal   Collection Time: 04/13/22  2:46 PM  Result Value Ref Range   Sodium 118 (LL) 135 - 145 mmol/L    Comment: CRITICAL RESULT CALLED TO, READ BACK BY AND VERIFIED WITH JERRI PEGRAM RN AT 1556 ON 04/13/22 BY I.SUGUT RESULTS VERIFIED BY REPEAT TESTING    Potassium 3.6 3.5 - 5.1 mmol/L   Chloride 84 (L) 98 - 111 mmol/L   CO2 23 22 - 32 mmol/L   Glucose, Bld 47 (L) 70 - 99 mg/dL    Comment: Glucose reference range applies only to samples taken after fasting for at least 8 hours.   BUN 22 8 - 23 mg/dL   Creatinine, Ser 0.87 0.61 - 1.24 mg/dL   Calcium 9.2 8.9 - 10.3 mg/dL   GFR, Estimated >60 >60 mL/min    Comment: (NOTE) Calculated using the CKD-EPI Creatinine Equation (2021)    Anion gap 11 5 - 15    Comment: Performed at [facility], [address] [place], [place] [address]  CBG monitoring, ED     Status: Abnormal   Collection Time: 04/13/22  4:24 PM  Result Value Ref Range   Glucose-Capillary 42 (LL) 70 - 99 mg/dL    Comment: Glucose reference range applies only to samples taken after fasting for at  least 8 hours.   Comment 1 Notify RN    Comment 2 Document in Chart   CBG monitoring, ED     Status: Abnormal   Collection Time: 04/13/22  4:37 PM  Result Value Ref Range   Glucose-Capillary 192 (H) 70 - 99 mg/dL    Comment: Glucose reference range applies only to samples taken after fasting for at least  8 hours.  Urinalysis, Routine w reflex microscopic     Status: Abnormal   Collection Time: 04/13/22  5:24 PM  Result Value Ref Range   Color, Urine YELLOW YELLOW   APPearance CLEAR CLEAR   Specific Gravity, Urine 1.015 1.005 - 1.030   pH 7.0 5.0 - 8.0   Glucose, UA 100 (A) NEGATIVE mg/dL   Hgb urine dipstick NEGATIVE NEGATIVE   Bilirubin Urine NEGATIVE NEGATIVE   Ketones, ur NEGATIVE NEGATIVE mg/dL   Protein, ur NEGATIVE NEGATIVE mg/dL   Nitrite NEGATIVE NEGATIVE   Leukocytes,Ua NEGATIVE NEGATIVE    Comment: Microscopic not done on urines with negative protein, blood, leukocytes, nitrite, or glucose < 500 mg/dL. Performed at [facility], [address] [place], [place] [address]   CBG monitoring, ED     Status: None   Collection Time: 04/13/22  6:47 PM  Result Value Ref Range   Glucose-Capillary 88 70 - 99 mg/dL    Comment: Glucose reference range applies only to samples taken after fasting for at least 8 hours.   Comment 1 Notify RN    CT Cervical Spine Wo Contrast  Result Date: 04/13/2022 CLINICAL DATA:  Trauma, fall EXAM: CT CERVICAL SPINE WITHOUT CONTRAST TECHNIQUE: Multidetector CT imaging of the cervical spine was performed without intravenous contrast. Multiplanar CT image reconstructions were also generated. RADIATION DOSE REDUCTION: This exam was performed according to the departmental dose-optimization program which includes automated exposure control, adjustment of the mA and/or kV according to patient size and/or use of iterative reconstruction technique. COMPARISON:  None Available. FINDINGS: Alignment: Alignment of posterior margins of vertebral bodies is  within normal limits. Skull base and vertebrae: No recent fracture is seen. Degenerative changes are noted. Soft tissues and spinal canal: There is thickening and calcifications in transverse ligament posterior to the odontoid process. This may be due to chronic inflammation. Posterior bony spurs are causing extrinsic pressure lower the ventral margin of thecal sac, more so at C3-C4 level. Disc levels: There is mild encroachment of neural foramina at C3-C4 level. Upper chest: Unremarkable. Other: Scattered arterial calcifications are seen. IMPRESSION: No recent fracture is seen in cervical spine.  Cervical spondylosis. Electronically Signed   By: Elmer Picker M.D.   On: 04/13/2022 16:05   CT Head Wo Contrast  Result Date: 04/13/2022 CLINICAL DATA:  Altered mental status, recent fall EXAM: CT HEAD WITHOUT CONTRAST TECHNIQUE: Contiguous axial images were obtained from the base of the skull through the vertex without intravenous contrast. RADIATION DOSE REDUCTION: This exam was performed according to the departmental dose-optimization program which includes automated exposure control, adjustment of the mA and/or kV according to patient size and/or use of iterative reconstruction technique. COMPARISON:  01/17/2020 FINDINGS: Brain: No acute intracranial findings are seen. There are no signs of bleeding within the cranium. Cortical sulci are prominent, especially in the left parietal cortex. These findings appear stable. This may suggest encephalomalacia. There is no focal edema or mass effect. Vascular: Unremarkable. Skull: Unremarkable. Sinuses/Orbits: There is mild mucosal thickening in ethmoid and maxillary sinuses. There is fluid density in multiple right mastoid air cells. Similar finding was seen in the previous study. Other: None. IMPRESSION: No acute intracranial findings are seen in noncontrast CT brain. Atrophy, more severe in the left parietal cortex. No significant interval changes are noted.  Electronically Signed   By: Elmer Picker M.D.   On: 04/13/2022 16:00   CUP PACEART REMOTE DEVICE CHECK  Result Date: 04/12/2022 Scheduled remote reviewed. Normal  device function.  AM-VP 93.5%, VP only 6.3%, A4 2.96mS2 Next remote 91 days. AV Conduction Mode Switch 0 % LA   Pending Labs Unresulted Labs (From admission, onward)     Start     Ordered   04/13/22 1639  Na and K (sodium & potassium), rand urine  Once,   URGENT        04/13/22 1638   04/13/22 1639  Osmolality, urine  Once,   URGENT        04/13/22 1638   04/13/22 1427  Urine Culture  Once,   URGENT       Question:  Indication  Answer:  Altered mental status (if no other cause identified)   04/13/22 1427            Vitals/Pain Today's Vitals   04/13/22 1515 04/13/22 1700 04/13/22 1729 04/13/22 1854  BP: (!) 120/50  (!) 138/53 (!) 148/61  Pulse: (!) 59 60 66   Resp: '15 13 16 17  '$ Temp:    (!) 97.5 F (36.4 C)  TempSrc:    Oral  SpO2: 95% 99% 100% 100%  Weight:      Height:      PainSc:        Isolation Precautions No active isolations  Medications Medications  sodium chloride 0.9 % bolus 1,000 mL (0 mLs Intravenous Stopped 04/13/22 1850)  dextrose 50 % solution 50 mL (50 mLs Intravenous Given 04/13/22 1629)    Mobility walks Moderate fall risk   Focused Assessments    R Recommendations: See Admitting Provider Note  Report given to:   Additional Notes

## 2022-04-13 NOTE — H&P (Signed)
History and Physical      Nathan Howell TXM:[id number] DOB: 14-Jul-1934 DOA: 04/13/2022  PCP: Burnard Bunting, MD  Patient coming from: home   I have personally briefly reviewed patient's old medical records in [place]  Chief Complaint: confusion  HPI: Nathan Howell is a 88 y.o. male with medical history significant for chronic hyponatremia with baseline serum sodium level of 1 24-1 30, chronic diastolic heart failure, complete heart block status post pacemaker placement in October 2020, essential pretension, hyperlipidemia, CAD status post CABG, type 2 diabetes mellitus, anemia of chronic disease associated baseline hemoglobin 12-13, who is admitted to [facility] on 04/13/2022 by way of transfer from [place] emergency department with acute metabolic encephalopathy in the setting of acute on chronic hyponatremia After presenting from home to the latter facility for evaluation of altered mental status.  In the setting of the patient's altered mental status, the following history is provided by the patient's family as well as from [place] in addition to chart review.  The patient has been exhibiting evidence of altered mental status, confusion relative to baseline mental status over the last 3 to 4 days associated with a relative increase in somnolence over that timeframe.  Approximately 3 days ago, the patient also experienced a fall, that was unwitnessed by the patient's family, with associated unclear downtime.  This is not associate with any report of acute focal weakness nor any acute focal numbness, paresthesias, vertigo, facial droop, dysarthria, nausea, vomiting.  No recent falls reported.  No known history of underlying seizures, and the above has not been associated with any witnessed tonic-clonic activity.   Medical history notable for chronic hyponatremia, with serum sodium range dating back to October 2014 noted to be 1 24-1 30, with most recent prior  value noted to be 127 on 02/01/2019.  Outpatient medications are notable for HCTZ as well as potassium sparing diuretic.  He also has a history of chronic diastolic heart failure, with most recent prior echocardiogram noted to have occurred in October 2020, with results at that time notable for LVEF 50 to 48%, grade 1 diastolic dysfunction, normal right ventricular systolic function, trivial tricuspid regurgitation.  He also has documented history of type 2 diabetes mellitus, with most recent prior hemoglobin A1c noted to be 6.2% in November 2016.  It appears that he is on basal insulin as well as multiple oral hypoglycemic agents at home.  In the setting of perpetuation of altered mental status relative to baseline, the patient was brought to [place] emergency department earlier today for further evaluation management thereof.     [facility] ED Course:  Vital signs in the ED were notable for the following: Afebrile; heart rate 25-00; systolic pressures 370W to 140s; respiratory rate 15-17, oxygen saturation 97 to 100% on room air.  Labs were notable for the following: BMP collected at approximately 1500 on 04/13/2022 was notable for the following: Sodium 118, potassium 3.6, chloride 84 creatinine 0.87, BUN/creatinine ratio 25.3, glucose 47.  Following p.o. challenge as well as amp of D50, CBG noted to have improved to 192.  However, upon arrival at [facility] emergency department, CBG noted to be 51.  CBC notable for white blood cell count 8300, hemoglobin 10.7.  Urinalysis notable for no blood cells, leukocyte esterase/nitrate negative.  Urine sodium/potassium levels currently pending.  Urine osmolality pending.  Repeat BMP collected around 2000 on 04/13/2022 showed interval increase in sodium to 120.  Per my interpretation, EKG in ED demonstrated the following: Failure with heart rate 62, nonspecific T wave inversion in aVL, no evidence of ST changes, including no evidence  of ST elevation.  Imaging and additional notable ED work-up: Noncontrast CT head, in comparison to CT head from October 2021, per formal radiology read, shows no evidence of acute process, including no evidence of intracranial hemorrhage or any evidence of acute infarct, also showing no evidence of interval changes relative to CT head from October 2021.  CT cervical spine, peripheral radiology read, shows no evidence of acute cervical spinal fracture nor subluxation injury.  While at [facility] ED, the following were administered: Normal saline x 1 L bolus administered over 2 hours, in addition to aforementioned 1 amp of D50.  Upon arrival at [facility] emergency department, [place] conveys that the patient's case has been discussed with on-call PCCM who recommend admission to the hospitalist service for further evaluation management acute encephalopathy in setting of acute on chronic hyponatremia, in the absence of initial need for hypertonic saline infusion.  Subsequently, the patient was admitted to the hospital service for further evaluation management of suspected acute metabolic encephalopathy in setting of acute on chronic hyponatremia with presenting labs also notable for hypoglycemia as well as prerenal azotemia.    Review of Systems: As per HPI otherwise 10 point review of systems negative.   Past Medical History:  Diagnosis Date   Abdominal hernia    Asthma    [place] - HFA 50% 03/16/09 > 50% 05/13/10   Collagenous colitis    COPD (chronic obstructive pulmonary disease) ([place]) 2015   Coronary artery disease 2005   Diverticulosis    GERD (gastroesophageal reflux disease)    Glaucoma    History of stress test 02/2011   Which showed mild inferior thinning felt to be diaphragmatic attenuation artifact without ichemia. Poststress EF was 65%.   Hx of echocardiogram    EF>55% this showed moderate concentric LVH with normal systolic function. There was evidence for grade 1 diastolic  relaxation abnormality with possible very small region of apical lateral hypokinesis.He did have moderate left atrial dilatation by volume assessment. There was mild mitral annular calcification with trace MR, trace TR, mild aortic valve sclerosis without stenosis     Hyperlipidemia    Hypertension    Rhinitis medicamentosa    per remote Minette Brine w/u   S/P placement of cardiac pacemaker 01/31/19 leadless  02/01/2019   Type II diabetes mellitus [facility])     Past Surgical History:  Procedure Laterality Date   CARDIAC CATHETERIZATION  02/2004   Normal left ventricular function. Significant multivessel coronary artery disease corresponding to left main equivalent disease with diffuse tubular narrowing of 50% in the proximal left anterior descending, 905 in the ramus intermadius vessel and 80% in the most proximal circumflex veseel with 70% mid A-V groove circumflex stenosis.   CATARACT EXTRACTION W/ INTRAOCULAR LENS IMPLANT Right 2003    CORNEAL TRANSPLANT Right ~ 2004   CORONARY ANGIOPLASTY     CORONARY ARTERY BYPASS GRAFT     x3 By Dr Servando Snare with LIMA to the LAD, a vein to the intermediate, a vein to the distal circumflex. [name] was nondominant and not bypassed.   GLAUCOMA VALVE INSERTION Right    HAND SURGERY Right    "don't remember what was done"   [place]   "not sure which side"   KNEE ARTHROSCOPY Bilateral    PACEMAKER LEADLESS INSERTION  N/A 01/31/2019   Procedure: PACEMAKER LEADLESS INSERTION;  Surgeon: Thompson Grayer, MD;  Location: [place] [facility];  Service: Cardiovascular;  Laterality: N/A;   TEMPORARY PACEMAKER N/A 01/30/2019   Procedure: TEMPORARY PACEMAKER;  Surgeon: Burnell Blanks, MD;  Location: [place] [facility];  Service: Cardiovascular;  Laterality: N/A;    Social History:  reports that he has never smoked. His smokeless tobacco use includes snuff. He reports that he does not drink alcohol and does not use drugs.   Allergies   Allergen Reactions   Ranitidine Other (See Comments)    Family History  Problem Relation Age of Onset   Asthma Mother    Emphysema Father    Colon cancer Neg Hx    Stomach cancer Neg Hx    Esophageal cancer Neg Hx    Pancreatic cancer Neg Hx    Liver disease Neg Hx     Family history reviewed and not pertinent    Prior to Admission medications   Medication Sig Start Date End Date Taking? Authorizing Provider  amLODipine (NORVASC) 5 MG tablet Take 5 mg by mouth daily.    [provider]  aspirin 81 MG tablet Take 81 mg by mouth daily.  11/18/09   [provider]  budesonide (ENTOCORT EC) 3 MG 24 hr capsule Take 2 tablets daily. 12/22/21   Levin Erp, PA  donepezil (ARICEPT) 5 MG tablet Take 10 mg by mouth daily. 10/30/19   [provider]  dorzolamide-timolol (COSOPT) 22.3-6.8 MG/ML ophthalmic solution Place 1 drop into the right eye 2 (two) times daily.    [provider]  glipiZIDE (GLUCOTROL) 5 MG tablet Take 5 mg by mouth 2 (two) times daily before a meal.    [provider]  Insulin Detemir (LEVEMIR FLEXTOUCH) 100 UNIT/ML Pen Inject 19 Units into the skin daily before breakfast.     [provider]  losartan (COZAAR) 100 MG tablet Take 100 mg by mouth at bedtime.     [provider]  metFORMIN (GLUCOPHAGE-XR) 500 MG 24 hr tablet Take 2 tablets (1,000 mg total) by mouth daily with breakfast. 02/03/19   Isaiah Serge, NP  metoprolol tartrate (LOPRESSOR) 25 MG tablet Keep scheduled follow up appointment. 06/29/21   Troy Sine, MD  rosuvastatin (CRESTOR) 20 MG tablet TAKE 1 TABLET DAILY 12/12/21   Troy Sine, MD  triamterene-hydrochlorothiazide (MAXZIDE-25) 37.5-25 MG tablet Take 1 tablet by mouth daily.    [provider]     Objective    Physical Exam: Vitals:   04/13/22 2130 04/13/22 2251 04/13/22 2255 04/13/22 2300  BP: (!) 114/51  102/69 (!) 117/59  Pulse: (!) 59  60 63  Resp:  '15  13 17  '$ Temp:   97.7 F (36.5 C)   TempSrc:   Oral   SpO2: 99% 99% 97% 99%  Weight:      Height:        General: appears to be stated age; alert, confused Skin: warm, dry, no rash Head:  AT/[place] Mouth:  Oral mucosa membranes appear dry, normal dentition Neck: supple; trachea midline Heart:  RRR; did not appreciate any M/R/G Lungs: CTAB, did not appreciate any wheezes, rales, or rhonchi Abdomen: + BS; soft, ND, NT Vascular: 2+ pedal pulses b/l; 2+ radial pulses b/l Extremities: no peripheral edema, no muscle wasting Neuro: strength and sensation intact in upper and lower extremities b/l    Labs on Admission: I have personally reviewed following labs and imaging  studies  CBC: Recent Labs  Lab 04/13/22 1446  WBC 8.3  NEUTROABS 5.5  HGB 12.7*  HCT 34.4*  MCV 87.8  PLT 952   Basic Metabolic Panel: Recent Labs  Lab 04/13/22 1446 04/13/22 2029  NA 118* 120*  K 3.6 3.4*  CL 84* 87*  CO2 23 22  GLUCOSE 47* 67*  BUN 22 19  CREATININE 0.87 0.85  CALCIUM 9.2 8.7*   GFR: Estimated Creatinine Clearance: 64.4 mL/min (by C-G formula based on SCr of 0.85 mg/dL). Liver Function Tests: No results for input(s): "AST", "ALT", "ALKPHOS", "BILITOT", "PROT", "ALBUMIN" in the last 168 hours. No results for input(s): "LIPASE", "AMYLASE" in the last 168 hours. No results for input(s): "AMMONIA" in the last 168 hours. Coagulation Profile: No results for input(s): "INR", "PROTIME" in the last 168 hours. Cardiac Enzymes: No results for input(s): "CKTOTAL", "CKMB", "CKMBINDEX", "TROPONINI" in the last 168 hours. BNP (last 3 results) No results for input(s): "PROBNP" in the last 8760 hours. HbA1C: No results for input(s): "HGBA1C" in the last 72 hours. CBG: Recent Labs  Lab 04/13/22 1624 04/13/22 1637 04/13/22 1847  GLUCAP 42* 192* 88   Lipid Profile: No results for input(s): "CHOL", "HDL", "LDLCALC", "TRIG", "CHOLHDL", "LDLDIRECT" in the last 72 hours. Thyroid Function  Tests: No results for input(s): "TSH", "T4TOTAL", "FREET4", "T3FREE", "THYROIDAB" in the last 72 hours. Anemia Panel: No results for input(s): "VITAMINB12", "FOLATE", "FERRITIN", "TIBC", "IRON", "RETICCTPCT" in the last 72 hours. Urine analysis:    Component Value Date/Time   COLORURINE YELLOW 04/13/2022 1724   APPEARANCEUR CLEAR 04/13/2022 1724   LABSPEC 1.015 04/13/2022 1724   PHURINE 7.0 04/13/2022 1724   GLUCOSEU 100 (A) 04/13/2022 1724   HGBUR NEGATIVE 04/13/2022 1724   BILIRUBINUR NEGATIVE 04/13/2022 1724   KETONESUR NEGATIVE 04/13/2022 1724   PROTEINUR NEGATIVE 04/13/2022 1724   NITRITE NEGATIVE 04/13/2022 1724   LEUKOCYTESUR NEGATIVE 04/13/2022 1724    Radiological Exams on Admission: CT Cervical Spine Wo Contrast  Result Date: 04/13/2022 CLINICAL DATA:  Trauma, fall EXAM: CT CERVICAL SPINE WITHOUT CONTRAST TECHNIQUE: Multidetector CT imaging of the cervical spine was performed without intravenous contrast. Multiplanar CT image reconstructions were also generated. RADIATION DOSE REDUCTION: This exam was performed according to the departmental dose-optimization program which includes automated exposure control, adjustment of the mA and/or kV according to patient size and/or use of iterative reconstruction technique. COMPARISON:  None Available. FINDINGS: Alignment: Alignment of posterior margins of vertebral bodies is within normal limits. Skull base and vertebrae: No recent fracture is seen. Degenerative changes are noted. Soft tissues and spinal canal: There is thickening and calcifications in transverse ligament posterior to the odontoid process. This may be due to chronic inflammation. Posterior bony spurs are causing extrinsic pressure lower the ventral margin of thecal sac, more so at C3-C4 level. Disc levels: There is mild encroachment of neural foramina at C3-C4 level. Upper chest: Unremarkable. Other: Scattered arterial calcifications are seen. IMPRESSION: No recent fracture is  seen in cervical spine.  Cervical spondylosis. Electronically Signed   By: Elmer Picker M.D.   On: 04/13/2022 16:05   CT Head Wo Contrast  Result Date: 04/13/2022 CLINICAL DATA:  Altered mental status, recent fall EXAM: CT HEAD WITHOUT CONTRAST TECHNIQUE: Contiguous axial images were obtained from the base of the skull through the vertex without intravenous contrast. RADIATION DOSE REDUCTION: This exam was performed according to the departmental dose-optimization program which includes automated exposure control, adjustment of the mA and/or kV according to patient size and/or use  of iterative reconstruction technique. COMPARISON:  01/17/2020 FINDINGS: Brain: No acute intracranial findings are seen. There are no signs of bleeding within the cranium. Cortical sulci are prominent, especially in the left parietal cortex. These findings appear stable. This may suggest encephalomalacia. There is no focal edema or mass effect. Vascular: Unremarkable. Skull: Unremarkable. Sinuses/Orbits: There is mild mucosal thickening in ethmoid and maxillary sinuses. There is fluid density in multiple right mastoid air cells. Similar finding was seen in the previous study. Other: None. IMPRESSION: No acute intracranial findings are seen in noncontrast CT brain. Atrophy, more severe in the left parietal cortex. No significant interval changes are noted. Electronically Signed   By: Elmer Picker M.D.   On: 04/13/2022 16:00   CUP PACEART REMOTE DEVICE CHECK  Result Date: 04/12/2022 Scheduled remote reviewed. Normal device function.  AM-VP 93.5%, VP only 6.3%, A4 2.70mS2 Next remote 91 days. AV Conduction Mode Switch 0 % LA     Assessment/Plan    Principal Problem:   Acute metabolic encephalopathy Active Problems:   DM2 (diabetes mellitus, type 2) (HCC)   Essential hypertension   Hyperlipidemia   Acute hyponatremia   Acute prerenal azotemia   Hypoglycemia   Chronic diastolic CHF (congestive heart failure)  (HCC)   Anemia of chronic disease      #) Acute metabolic encephalopathy: 3 to 4 days of confusion, somnolence relative to baseline mental status, with suspected contribution from acute on chronic hyponatremia, as further detailed below.  No overt evidence of underlying factious process at this time, including UA that is inconsistent with UTI.  However, particular in setting of acute on chronic hyponatremia, will also check chest x-ray.  There also appears to be a potential contribution from dehydration, as further described below.  Differential also includes potential contribution from recurrent hypoglycemia, as further detailed below.   No additional overt metabolic/electrolyte contributions at this time.  No obvious contributory pharmacologic factors. No overt acute focal neurologic deficits to suggest a contribution from an underlying acute CVA, while presenting CT head shows no evidence of acute process. Seizures are also felt to be less likely but are certainly a possibility given concomitant acute on chronic hyponatremia. Will check VBG to evaluate for any contribution from hypercapnic encephalopathy.   Plan: fall precautions. Repeat CMP/CBC in the AM. Check magnesium level. check TSH, vbg, INR.  Check CPK.  Chest x-ray.  Urinary drug screen.  Fall precautions.  Further evaluation management of acute on chronic hyponatremia, as further detailed below.            #) Acute on chronic hypo-osmolar hyponatremia: In the setting of a history of chronic hyponatremia with serum sodium levels in the range of 1 24-1 30 over the last 10 years, presenting serum sodium level slightly lower than his baseline, with initial value of 118.  suspect an element of hypovolumeia, with suspected contribution from dehydration in the setting of clinical evidence of such, including finding of a acute prerenal azotemia as well as concomitant hypochloremia.  Suspect additional contribution from home HCTZ, which is  also consistent with concomitant hypochloremia.  Unclear contribution from systemic corticosteroid initiation in the form of oral budesonide.  Differential also includes the possibility of a contribution from SIADH as well as cerebral salt wasting syndrome given recent fall.  No overt evidence of recent seizure-like activity, although differential includes this possibility.  While at presentation is associated with acute encephalopathy, there are no overt acute focal neurologic deficits at this time, CT head shows  no evidence of acute process.  As noted above, case was reviewed by PCCM, who recommended admission to hospital service, without the need for initiation of hypertonic saline at this time, per their review.  As the patient's serum sodium level has increased by 2 points over 5 hours following a 1 L normal saline bolus.  This appears to be an appropriate rate of improvement in serum sodium level, but will closely monitor for ensuing serum sodium trend to ensure that there is not rapid correction of the patient's serum sodium level, particularly noting the element of chronicity associated with his hyponatremia, IV fluids, as further detailed below.  While following for results of additional diagnostic studies, as indicated below.   Plan: monitor strict I's and O's and daily weights.  Follow-up result of urine osmolality as well as urine sodium.  Add on random urine creatinine. Check serum osmolality to confirm suspected hypoosmolar etiology. Check serum uric acid level, as SIADH can be associated with hypouricemia due to hyperuricuria.   Check TSH, INR, BNP. Gentle IVF's in the form of normal saline at 75 cc/h.  Every 4 hours BMP has been ordered therapy of 1300 on 04/14/2022.  Hold home HCTZ.  Precautions ordered.  Seizure precautions.  Every 4 hours neurochecks x 3 occurrences.  Chest x-ray.               #) Hypoglycemia in the context of documented history of type 2 diabetes mellitus,  with most recent hemoglobin A1c noted to be 6.2% in November 2016, for which it appears patient is on basal insulin as well as multiple oral hypoglycemic agents, including sulfonylurea, as an outpatient, CBG monitoring has revealed recurrent hypoglycemia and spite of amp of D50 administered over [place].  Is unclear at this time aforementioned contribution patient's hypoglycemia is contributing towards his presenting acute encephalopathy.  Initially, is unclear if the patient's is on the basis of the need for sulfonylurea/basal insulin regimen versus there was contribution from the patient accidentally taking too much of the send medications as a consequence of his initial development of confusion resulting in a compounding of this process via ensuing event of worsening hypoglycemia.  Will check hemoglobin A1c to further assess these possibilities.  Plan: every 2 hours CBG monitoring for CBG result less than 70.  Will then, tentatively, plan to decrease frequency CBG monitoring to every 4 hours CBG monitoring x 5 occurrences.  Add on hemoglobin A1c level.  Chest x-ray.  Holding basal insulin as well as outpatient oral hypoglycemic agents.            #) acute prerenal azotemia: As quantified above, suspected to be consistent with element of dehydration contributing to the patient's acute on chronic hyponatremia, although there is also possibility of hydration from systolic corticosteroids in the form of p.o. budesonide.   Plan: Gentle IV fluids, with close monitoring of ensuing BMPs via every 4 hour checks, as outlined above.  Monitor strict I's and O's and daily weights.  Hold home HCTZ as well as potassium sparing diuretic for now.              #) Essential Hypertension: documented h/o such, with outpatient antihypertensive regimen including HCTZ, potassium sparing diuretic, amlodipine, losartan, metoprolol tartrate.  SBP's in the ED today: 110s to 140s mmHg. in the  setting of presenting acute on chronic hyponatremia with suspected element of hypovolemia, will hold home HCTZ as well as potassium sparing diuretic for now.  Will continue home  beta-blocker but otherwise hold home antihypertensive medications for now.  Plan: Close monitoring of subsequent BP via routine VS. continuing beta-blocker.  Hold other home and hypertensive medications for now, as above.  Further evaluation management of acute on chronic hyponatremia, as further detailed above.              #) Hyperlipidemia: documented h/o such. On high intensity rosuvastatin as outpatient.  In the setting of presenting acute metabolic encephalopathy, will add on CPK level.   Plan: Hold home statin for now in the setting of pending CPK level, as above.                #) Chronic diastolic heart failure: documented history of such, with most recent echocardiogram performed October 2020, which was notable for 5% as well as grade 1 diastolic dysfunction. No clinical evidence to suggest acutely decompensated heart failure at this time.  Acute on chronic hyponatremia, will further assess for an element of will, including proceed with chest x-ray as well as BNP. home diuretic regimen reportedly consists of the following: HCTZ as well as potassium sparing diuretic.    Plan: monitor strict I's & O's and daily weights. Repeat CMP in AM. Check serum mag level.  Hold home diuretic regimen, as above.  Chest x-ray, BNP.           #) Anemia of chronic disease: Documented history of such, a/w with baseline hgb range 12-13, with presenting hgb consistent with this range, in the absence of any overt evidence of active bleed.     Plan: Repeat CBC in the morning.  Follow for result of INR.     DVT prophylaxis: SCD's   Code Status: Full code Disposition Plan: Per Rounding Team Consults called: EDP discussed patient's case with on-call PCCM, who recommended admission to [facility], as further  detailed above Admission status: Inpatient    I SPENT GREATER THAN 75  MINUTES IN CLINICAL CARE TIME/MEDICAL DECISION-MAKING IN COMPLETING THIS ADMISSION.     [place] DO [facility] [place]   04/13/2022, 11:26 PM

## 2022-04-13 NOTE — ED Notes (Signed)
Family member will assist patient with urinal

## 2022-04-13 NOTE — ED Notes (Signed)
Pt son requested decaf coffee. Pt given the same.

## 2022-04-13 NOTE — ED Notes (Signed)
Pt provided ham sandwich prior to CBG check, D50 provided due to POC CBG was 51 per order.

## 2022-04-13 NOTE — ED Notes (Signed)
Family at bedside. 

## 2022-04-13 NOTE — ED Triage Notes (Signed)
Reported fall occurred on Tuesday while taking out the trash; unwitnessed fall. On the ground for 15 mins; + head injury; left elbow and laceration on left eyebrow. denies LOC when asked. Family at bedside reports hx of dementia. Concerns for decline in reflexes,  understanding, and memory.

## 2022-04-13 NOTE — ED Notes (Signed)
Called [facility] for Transport to [facility] ED.  Dr. Alvino Chapel accepting.

## 2022-04-13 NOTE — ED Notes (Signed)
Maylon Cos, RN notified [facility] here for transport.

## 2022-04-13 NOTE — ED Provider Notes (Signed)
[facility] EMERGENCY DEPARTMENT Provider Note   CSN: [id number] Arrival date & time: 04/13/22  1359     History  Chief Complaint  Patient presents with   Lytle Michaels    VICTORIA EUCEDA is a 88 y.o. male.  HPI    88 year old male with history of dementia ,COPD, type 2 diabetes, hypertension, hyperlipidemia, pacemaker, presents with concern for fall 2 days ago, change in mental status. . Had a fall on Tuesday while taking out the trash, reports his legs got tangled up and he fell down as he was turning Fall was unwitnessed, neighbor's saw him down on the ground No known seizure activity but unknown Family concern for decline in understanding and memory Lives alone, hx of dementia but pays bills, knows how to take his medications Has been sleeping in more last 2 days, until 1130 when he normally wakes up at 730, has seemed not himself, slow to respond, sleepy and confused.  Worried about head injury.  No fever, nausea, vomiting, diarrhea, chest pain, dyspnea.  No new medications other than those he is taking for his eye.  Family reports he eats a lot of sodium.  He reports eating and drinking normally recently.    Past Medical History:  Diagnosis Date   Abdominal hernia    Asthma    [place] - HFA 50% 03/16/09 > 50% 05/13/10   Collagenous colitis    COPD (chronic obstructive pulmonary disease) ([place]) 2015   Coronary artery disease 2005   Diverticulosis    GERD (gastroesophageal reflux disease)    Glaucoma    History of stress test 02/2011   Which showed mild inferior thinning felt to be diaphragmatic attenuation artifact without ichemia. Poststress EF was 65%.   Hx of echocardiogram    EF>55% this showed moderate concentric LVH with normal systolic function. There was evidence for grade 1 diastolic relaxation abnormality with possible very small region of apical lateral hypokinesis.He did have moderate left atrial dilatation by volume assessment. There was mild mitral annular  calcification with trace MR, trace TR, mild aortic valve sclerosis without stenosis     Hyperlipidemia    Hypertension    Rhinitis medicamentosa    per remote Minette Brine w/u   S/P placement of cardiac pacemaker 01/31/19 leadless  02/01/2019   Type II diabetes mellitus ([place])      Home Medications Prior to Admission medications   Medication Sig Start Date End Date Taking? Authorizing Provider  amLODipine (NORVASC) 5 MG tablet Take 5 mg by mouth daily.    [provider]  aspirin 81 MG tablet Take 81 mg by mouth daily.  11/18/09   [provider]  budesonide (ENTOCORT EC) 3 MG 24 hr capsule Take 2 tablets daily. 12/22/21   Levin Erp, PA  donepezil (ARICEPT) 5 MG tablet Take 10 mg by mouth daily. 10/30/19   [provider]  dorzolamide-timolol (COSOPT) 22.3-6.8 MG/ML ophthalmic solution Place 1 drop into the right eye 2 (two) times daily.    [provider]  glipiZIDE (GLUCOTROL) 5 MG tablet Take 5 mg by mouth 2 (two) times daily before a meal.    [provider]  Insulin Detemir (LEVEMIR FLEXTOUCH) 100 UNIT/ML Pen Inject 19 Units into the skin daily before breakfast.     [provider]  losartan (COZAAR) 100 MG tablet Take 100 mg by mouth at bedtime.     [provider]  metFORMIN (GLUCOPHAGE-XR) 500 MG 24 hr tablet Take 2 tablets (1,000 mg  total) by mouth daily with breakfast. 02/03/19   Isaiah Serge, NP  metoprolol tartrate (LOPRESSOR) 25 MG tablet Keep scheduled follow up appointment. 06/29/21   Troy Sine, MD  rosuvastatin (CRESTOR) 20 MG tablet TAKE 1 TABLET DAILY 12/12/21   Troy Sine, MD  triamterene-hydrochlorothiazide (MAXZIDE-25) 37.5-25 MG tablet Take 1 tablet by mouth daily.    [provider]      Allergies    Ranitidine    Review of Systems   Review of Systems  Physical Exam Updated Vital Signs BP (!) 146/58   Pulse 77   Temp 97.6 F (36.4 C) (Oral)   Resp 18   Ht 6' (1.829 m)    Wt 71.7 kg   SpO2 96%   BMI 21.43 kg/m  Physical Exam Vitals and nursing note reviewed.  Constitutional:      General: He is not in acute distress.    Appearance: Normal appearance. He is well-developed. He is not ill-appearing or diaphoretic.  HENT:     Head: Normocephalic and atraumatic.     Comments: Right corneal cloudy Eyes:     General: No visual field deficit.    Extraocular Movements: Extraocular movements intact.     Conjunctiva/sclera: Conjunctivae normal.     Pupils: Pupils are equal, round, and reactive to light.  Cardiovascular:     Rate and Rhythm: Normal rate and regular rhythm.     Pulses: Normal pulses.     Heart sounds: Normal heart sounds. No murmur heard.    No friction rub. No gallop.  Pulmonary:     Effort: Pulmonary effort is normal. No respiratory distress.     Breath sounds: Normal breath sounds. No wheezing or rales.  Abdominal:     General: There is no distension.     Palpations: Abdomen is soft.     Tenderness: There is no abdominal tenderness. There is no guarding.  Musculoskeletal:        General: No swelling or tenderness.     Cervical back: Normal range of motion.  Skin:    General: Skin is warm and dry.     Findings: No erythema or rash.     Comments: Small laceration steri strips left eyebrow, large contusion over left arm, no tenderness, good ROM  Neurological:     General: No focal deficit present.     Mental Status: He is alert and oriented to person, place, and time.     GCS: GCS eye subscore is 4. GCS verbal subscore is 5. GCS motor subscore is 6.     Cranial Nerves: No cranial nerve deficit, dysarthria or facial asymmetry.     Sensory: No sensory deficit.     Motor: No weakness or tremor.     Coordination: Coordination normal. Finger-Nose-Finger Test normal.     Gait: Gait (deferred) normal.     Comments: Unable to perform visual fields on right given vision loss in that eye     ED Results / Procedures / Treatments    Labs (all labs ordered are listed, but only abnormal results are displayed) Labs Reviewed  CBC WITH DIFFERENTIAL/PLATELET - Abnormal; Notable for the following components:      Result Value   RBC 3.92 (*)    Hemoglobin 12.7 (*)    HCT 34.4 (*)    MCHC 36.9 (*)    All other components within normal limits  BASIC METABOLIC PANEL - Abnormal; Notable for the following components:   Sodium 118 (*)  Chloride 84 (*)    Glucose, Bld 47 (*)    All other components within normal limits  URINALYSIS, ROUTINE W REFLEX MICROSCOPIC - Abnormal; Notable for the following components:   Glucose, UA 100 (*)    All other components within normal limits  BASIC METABOLIC PANEL - Abnormal; Notable for the following components:   Sodium 120 (*)    Potassium 3.4 (*)    Chloride 87 (*)    Glucose, Bld 67 (*)    Calcium 8.7 (*)    All other components within normal limits  CBC WITH DIFFERENTIAL/PLATELET - Abnormal; Notable for the following components:   HCT 38.6 (*)    All other components within normal limits  COMPREHENSIVE METABOLIC PANEL - Abnormal; Notable for the following components:   Sodium 121 (*)    Chloride 88 (*)    CO2 21 (*)    All other components within normal limits  BASIC METABOLIC PANEL - Abnormal; Notable for the following components:   Sodium 118 (*)    Chloride 90 (*)    CO2 20 (*)    Glucose, Bld 145 (*)    Calcium 8.3 (*)    All other components within normal limits  URIC ACID - Abnormal; Notable for the following components:   Uric Acid, Serum 2.6 (*)    All other components within normal limits  PROTIME-INR - Abnormal; Notable for the following components:   Prothrombin Time 15.3 (*)    All other components within normal limits  BLOOD GAS, VENOUS - Abnormal; Notable for the following components:   Acid-base deficit 2.2 (*)    All other components within normal limits  HEMOGLOBIN A1C - Abnormal; Notable for the following components:   Hgb A1c MFr Bld 6.7 (*)     All other components within normal limits  CBG MONITORING, ED - Abnormal; Notable for the following components:   Glucose-Capillary 42 (*)    All other components within normal limits  CBG MONITORING, ED - Abnormal; Notable for the following components:   Glucose-Capillary 192 (*)    All other components within normal limits  CBG MONITORING, ED - Abnormal; Notable for the following components:   Glucose-Capillary 51 (*)    All other components within normal limits  CBG MONITORING, ED - Abnormal; Notable for the following components:   Glucose-Capillary 139 (*)    All other components within normal limits  CBG MONITORING, ED - Abnormal; Notable for the following components:   Glucose-Capillary 139 (*)    All other components within normal limits  URINE CULTURE  OSMOLALITY, URINE  MAGNESIUM  TSH  BRAIN NATRIURETIC PEPTIDE  CK  NA AND K (SODIUM & POTASSIUM), RAND UR  BASIC METABOLIC PANEL  CREATININE, URINE, RANDOM  RAPID URINE DRUG SCREEN, HOSP PERFORMED  OSMOLALITY  BASIC METABOLIC PANEL  CBG MONITORING, ED  CBG MONITORING, ED  CBG MONITORING, ED    EKG EKG Interpretation  Date/Time:  Thursday April 13 2022 22:57:03 EST Ventricular Rate:  59 PR Interval:  139 QRS Duration: 122 QT Interval:  444 QTC Calculation: 440 R Axis:   -68 Text Interpretation: Sinus rhythm Ventricular preexcitation(WPW) No significant change since last tracing Confirmed by Isla Pence [phone]) on 04/14/2022 8:07:18 AM  Radiology DG Chest Port 1 View  Result Date: 04/14/2022 CLINICAL DATA:  Acute hyponatremia EXAM: PORTABLE CHEST 1 VIEW COMPARISON:  02/01/2019 FINDINGS: Sternotomy and CABG. Stable cardiomediastinal silhouette. No focal consolidation, pleural effusion, or pneumothorax. No acute osseous abnormality. IMPRESSION: No active  disease. Electronically Signed   By: Placido Sou M.D.   On: 04/14/2022 01:23   CT Cervical Spine Wo Contrast  Result Date: 04/13/2022 CLINICAL DATA:   Trauma, fall EXAM: CT CERVICAL SPINE WITHOUT CONTRAST TECHNIQUE: Multidetector CT imaging of the cervical spine was performed without intravenous contrast. Multiplanar CT image reconstructions were also generated. RADIATION DOSE REDUCTION: This exam was performed according to the departmental dose-optimization program which includes automated exposure control, adjustment of the mA and/or kV according to patient size and/or use of iterative reconstruction technique. COMPARISON:  None Available. FINDINGS: Alignment: Alignment of posterior margins of vertebral bodies is within normal limits. Skull base and vertebrae: No recent fracture is seen. Degenerative changes are noted. Soft tissues and spinal canal: There is thickening and calcifications in transverse ligament posterior to the odontoid process. This may be due to chronic inflammation. Posterior bony spurs are causing extrinsic pressure lower the ventral margin of thecal sac, more so at C3-C4 level. Disc levels: There is mild encroachment of neural foramina at C3-C4 level. Upper chest: Unremarkable. Other: Scattered arterial calcifications are seen. IMPRESSION: No recent fracture is seen in cervical spine.  Cervical spondylosis. Electronically Signed   By: Elmer Picker M.D.   On: 04/13/2022 16:05   CT Head Wo Contrast  Result Date: 04/13/2022 CLINICAL DATA:  Altered mental status, recent fall EXAM: CT HEAD WITHOUT CONTRAST TECHNIQUE: Contiguous axial images were obtained from the base of the skull through the vertex without intravenous contrast. RADIATION DOSE REDUCTION: This exam was performed according to the departmental dose-optimization program which includes automated exposure control, adjustment of the mA and/or kV according to patient size and/or use of iterative reconstruction technique. COMPARISON:  01/17/2020 FINDINGS: Brain: No acute intracranial findings are seen. There are no signs of bleeding within the cranium. Cortical sulci are  prominent, especially in the left parietal cortex. These findings appear stable. This may suggest encephalomalacia. There is no focal edema or mass effect. Vascular: Unremarkable. Skull: Unremarkable. Sinuses/Orbits: There is mild mucosal thickening in ethmoid and maxillary sinuses. There is fluid density in multiple right mastoid air cells. Similar finding was seen in the previous study. Other: None. IMPRESSION: No acute intracranial findings are seen in noncontrast CT brain. Atrophy, more severe in the left parietal cortex. No significant interval changes are noted. Electronically Signed   By: Elmer Picker M.D.   On: 04/13/2022 16:00   CUP PACEART REMOTE DEVICE CHECK  Result Date: 04/12/2022 Scheduled remote reviewed. Normal device function.  AM-VP 93.5%, VP only 6.3%, A4 2.41mS2 Next remote 91 days. AV Conduction Mode Switch 0 % LA   Procedures Procedures    Medications Ordered in ED Medications  acetaminophen (TYLENOL) tablet 650 mg (has no administration in time range)    Or  acetaminophen (TYLENOL) suppository 650 mg (has no administration in time range)  melatonin tablet 3 mg (has no administration in time range)  dextrose 50 % solution 50 mL (50 mLs Intravenous Given 04/13/22 2341)  0.9 %  sodium chloride infusion (0 mLs Intravenous Stopped 04/14/22 0914)  metoprolol tartrate (LOPRESSOR) tablet 25 mg (25 mg Oral Given 04/14/22 0921)  sodium chloride 0.9 % bolus 1,000 mL (0 mLs Intravenous Stopped 04/13/22 1850)  dextrose 50 % solution 50 mL (50 mLs Intravenous Given 04/13/22 1629)  haloperidol lactate (HALDOL) injection 5 mg (5 mg Intramuscular Given 04/14/22 0436)    ED Course/ Medical Decision Making/ A&P  88 year old male with history of dementia ,COPD, type 2 diabetes, hypertension, hyperlipidemia, pacemaker, presents with concern for fall 2 days ago, change in mental status.  Differential diagnosis includes anemia, electrolyte abnormality, cardiac  abnormality, infection, hypothyroidism, other toxic/metabolic abnormalities, ICH, concussion.  No focal neurologic concerns on history or exam to suggest CVA.  Pt hemodynamically stable, afebrile, and without hx of infectious symptoms.    Labs completed and personally evaluated by me.  Urinalysis shows no sign of infection. CBC showed no significant anemia.  Electrolytes with hypoglycemia and hyponatremia 118.    EKG was not changed from prior.   CT head and CSpine personally evaluated and interpreted by me and radiology show no sign of ICH or fracture.  Suspect change in mental baseline, sleepiness, slowness secondary to worsening of his chronic hyponatremia.  Unclear etiology. Do feel he appears slightly hypovolemic> Given fluid.  Hyponatremia Discussed with Dr. Marlyce Huge hospitalist for admission. Will transfer to ED at [facility] given complexities of hyponatremia, need for close monitoring.  Discussed with PCCM, recommends hospitalist evaluate at [facility] and can consult them if they feel indicated.  I do not feel at this time he needs hypertonoic saline. Less likely fall was seizure 2 days ago, more likely mechanical, does not appear encephalopathic, and while sodium low 118, prior sodium 125-127 on last available labs 2020.  [name]  Repeat Na is 120 from 118.  Glucose initially 42, given amp of D50.  Reports he has not had anything to eat all day. Did decrease again to 60s, instructed to feed him, [facility] came to transport to ED.         Final Clinical Impression(s) / ED Diagnoses Final diagnoses:  Fall, initial encounter  Injury of head, initial encounter  Hyponatremia  Hypoglycemia    Rx / DC Orders ED Discharge Orders     None         Gareth Morgan, MD 04/14/22 [id number]

## 2022-04-13 NOTE — ED Notes (Signed)
Fall risk armband Socks

## 2022-04-13 NOTE — Progress Notes (Signed)
Plan of Care Note for accepted transfer  Patient: Nathan Howell    LYY:[id number]  DOA: 04/13/2022     Facility requesting transfer: [facility] Requesting Provider: Dr. Billy Fischer Reason for transfer: Hyponatremia, encephalopathy, fall Facility course:  88 year old male with PMHx hypertension, hyperlipidemia, COPD, coronary disease status post CABG (follows with Dr. Shelva Majestic), collagenous colitis (follows with [facility]) DM II, complete heart block status post pacemaker,  presenting S/P fall with lethargy and confusion over the past several days to [facility] emergency department.    Patient is a very poor historian and history has been obtained from family at the bedside in the emergency department.  Patient apparently has been increasingly lethargic and confused over the past several days.  Patient additionally experienced a fall several days prior to presentation that was not witnessed.  Upon evaluation in the emergency department CT head unremarkable. No infectious symptoms according to EDP however no chest x-ray or urinalysis have been obtained as of yet.  BS initially found to be 42 however after an amp of D50 and an oral challenge now improved to 192.  Workup in the emergency department also revealed a sodium of 118.  Considering patient's several day history of worsening lethargy, confusion and unwitnessed fall that could potentially be seizure activity (albeit somewhat unlikely) I advised that literature supports first-line treatment n this patient is administration of hypertonic saline.  Unfortunately at [facility] patient will not receive extremely close monitoring for sudden sodium changes after initiation of sodium infusion and therefore I recommended an ED to ED transfer to [facility] or [facility] before initiating treatment.  Upon arrival to the receiving ED, I advised a discussion with PCCM to determine if patient would warrant hospitalization to the ICU for hypertonic saline  infusion.  If they do not feel ICU admission/hypertonic saline is necessary after arrival to one of those facilities, [facility] can then be contacted again for reconsideration of admission.  Plan of care:  To be transferred to [facility] or [facility] ED before initiation of treatment for hyponatremia for closer monitoring.  EDP to discuss case with PCCM for consideration of admission to their service for hypertonic saline upon arrival.  [facility] is happy to reconsider admission if PCCM does not feel admission to their service is appropriate.  Author: Vernelle Emerald, MD  04/13/2022  Check [email or web address] for on-call coverage.  Nursing staff, Please call [place] number on Amion as soon as patient's arrival, so appropriate admitting provider can evaluate the pt.

## 2022-04-14 ENCOUNTER — Inpatient Hospital Stay (HOSPITAL_COMMUNITY): Payer: Medicare HMO

## 2022-04-14 DIAGNOSIS — N19 Unspecified kidney failure: Secondary | ICD-10-CM | POA: Diagnosis present

## 2022-04-14 DIAGNOSIS — D638 Anemia in other chronic diseases classified elsewhere: Secondary | ICD-10-CM | POA: Diagnosis present

## 2022-04-14 DIAGNOSIS — G9341 Metabolic encephalopathy: Secondary | ICD-10-CM | POA: Diagnosis not present

## 2022-04-14 DIAGNOSIS — E871 Hypo-osmolality and hyponatremia: Secondary | ICD-10-CM | POA: Diagnosis not present

## 2022-04-14 DIAGNOSIS — E162 Hypoglycemia, unspecified: Secondary | ICD-10-CM | POA: Diagnosis present

## 2022-04-14 DIAGNOSIS — I5032 Chronic diastolic (congestive) heart failure: Secondary | ICD-10-CM | POA: Diagnosis present

## 2022-04-14 LAB — BASIC METABOLIC PANEL
Anion gap: 8 (ref 5–15)
Anion gap: 11 (ref 5–15)
Anion gap: 9 (ref 5–15)
BUN: 17 mg/dL (ref 8–23)
BUN: 16 mg/dL (ref 8–23)
BUN: 15 mg/dL (ref 8–23)
CO2: 20 mmol/L — ABNORMAL LOW (ref 22–32)
CO2: 19 mmol/L — ABNORMAL LOW (ref 22–32)
CO2: 20 mmol/L — ABNORMAL LOW (ref 22–32)
Calcium: 8.3 mg/dL — ABNORMAL LOW (ref 8.9–10.3)
Calcium: 8.7 mg/dL — ABNORMAL LOW (ref 8.9–10.3)
Calcium: 7.7 mg/dL — ABNORMAL LOW (ref 8.9–10.3)
Chloride: 90 mmol/L — ABNORMAL LOW (ref 98–111)
Chloride: 93 mmol/L — ABNORMAL LOW (ref 98–111)
Chloride: 95 mmol/L — ABNORMAL LOW (ref 98–111)
Creatinine, Ser: 0.77 mg/dL (ref 0.61–1.24)
Creatinine, Ser: 0.72 mg/dL (ref 0.61–1.24)
Creatinine, Ser: 0.81 mg/dL (ref 0.61–1.24)
GFR, Estimated: >60 mL/min (ref >60)
GFR, Estimated: >60 mL/min (ref >60)
GFR, Estimated: >60 mL/min (ref >60)
Glucose, Bld: 145 mg/dL — ABNORMAL HIGH (ref 70–99)
Glucose, Bld: 189 mg/dL — ABNORMAL HIGH (ref 70–99)
Glucose, Bld: 240 mg/dL — ABNORMAL HIGH (ref 70–99)
Potassium: 3.9 mmol/L (ref 3.5–5.1)
Potassium: 4 mmol/L (ref 3.5–5.1)
Potassium: 3.8 mmol/L (ref 3.5–5.1)
Sodium: 118 mmol/L — CL (ref 135–145)
Sodium: 123 mmol/L — ABNORMAL LOW (ref 135–145)
Sodium: 124 mmol/L — ABNORMAL LOW (ref 135–145)

## 2022-04-14 LAB — PROTIME-INR
INR: 1.2 (ref 0.8–1.2)
Prothrombin Time: 15.3 seconds — ABNORMAL HIGH (ref 11.4–15.2)

## 2022-04-14 LAB — HEMOGLOBIN A1C
Hgb A1c MFr Bld: 6.7 % — ABNORMAL HIGH (ref 4.8–5.6)
Mean Plasma Glucose: 145.59 mg/dL

## 2022-04-14 LAB — CBC WITH DIFFERENTIAL/PLATELET
Abs Immature Granulocytes: 0.03 10*3/uL (ref 0.00–0.07)
Basophils Absolute: 0 10*3/uL (ref 0.0–0.1)
Basophils Relative: 0 %
Eosinophils Absolute: 0.1 10*3/uL (ref 0.0–0.5)
Eosinophils Relative: 1 %
HCT: 38.6 % — ABNORMAL LOW (ref 39.0–52.0)
Hemoglobin: 13.9 g/dL (ref 13.0–17.0)
Immature Granulocytes: 0 %
Lymphocytes Relative: 25 %
Lymphs Abs: 2.2 10*3/uL (ref 0.7–4.0)
MCH: 31.8 pg (ref 26.0–34.0)
MCHC: 36 g/dL (ref 30.0–36.0)
MCV: 88.3 fL (ref 80.0–100.0)
Monocytes Absolute: 0.9 10*3/uL (ref 0.1–1.0)
Monocytes Relative: 10 %
Neutro Abs: 5.6 10*3/uL (ref 1.7–7.7)
Neutrophils Relative %: 64 %
Platelets: 283 10*3/uL (ref 150–400)
RBC: 4.37 MIL/uL (ref 4.22–5.81)
RDW: 12.2 % (ref 11.5–15.5)
WBC: 8.9 10*3/uL (ref 4.0–10.5)
nRBC: 0 % (ref 0.0–0.2)

## 2022-04-14 LAB — COMPREHENSIVE METABOLIC PANEL
ALT: 17 U/L (ref 0–44)
AST: 30 U/L (ref 15–41)
Albumin: 4.4 g/dL (ref 3.5–5.0)
Alkaline Phosphatase: 43 U/L (ref 38–126)
Anion gap: 12 (ref 5–15)
BUN: 19 mg/dL (ref 8–23)
CO2: 21 mmol/L — ABNORMAL LOW (ref 22–32)
Calcium: 9.2 mg/dL (ref 8.9–10.3)
Chloride: 88 mmol/L — ABNORMAL LOW (ref 98–111)
Creatinine, Ser: 0.86 mg/dL (ref 0.61–1.24)
GFR, Estimated: >60 mL/min (ref >60)
Glucose, Bld: 82 mg/dL (ref 70–99)
Potassium: 3.8 mmol/L (ref 3.5–5.1)
Sodium: 121 mmol/L — ABNORMAL LOW (ref 135–145)
Total Bilirubin: 0.8 mg/dL (ref 0.3–1.2)
Total Protein: 6.9 g/dL (ref 6.5–8.1)

## 2022-04-14 LAB — TSH: TSH: 4.015 u[IU]/mL (ref 0.350–4.500)

## 2022-04-14 LAB — CK: Total CK: 80 U/L (ref 49–397)

## 2022-04-14 LAB — OSMOLALITY: Osmolality: 259 mOsm/kg — ABNORMAL LOW (ref 275–295)

## 2022-04-14 LAB — BLOOD GAS, VENOUS
Acid-base deficit: 2.2 mmol/L — ABNORMAL HIGH (ref 0.0–2.0)
Bicarbonate: 23.7 mmol/L (ref 20.0–28.0)
O2 Saturation: 63.3 %
Patient temperature: 37
pCO2, Ven: 44 mmHg (ref 44–60)
pH, Ven: 7.34 (ref 7.25–7.43)
pO2, Ven: 38 mmHg (ref 32–45)

## 2022-04-14 LAB — CBG MONITORING, ED
Glucose-Capillary: 139 mg/dL — ABNORMAL HIGH (ref 70–99)
Glucose-Capillary: 139 mg/dL — ABNORMAL HIGH (ref 70–99)
Glucose-Capillary: 81 mg/dL (ref 70–99)
Glucose-Capillary: 93 mg/dL (ref 70–99)

## 2022-04-14 LAB — GLUCOSE, CAPILLARY: Glucose-Capillary: 266 mg/dL — ABNORMAL HIGH (ref 70–99)

## 2022-04-14 LAB — MAGNESIUM: Magnesium: 1.7 mg/dL (ref 1.7–2.4)

## 2022-04-14 LAB — BRAIN NATRIURETIC PEPTIDE: B Natriuretic Peptide: 55 pg/mL (ref 0.0–100.0)

## 2022-04-14 LAB — URIC ACID: Uric Acid, Serum: 2.6 mg/dL — ABNORMAL LOW (ref 3.7–8.6)

## 2022-04-14 MED ORDER — HALOPERIDOL LACTATE 5 MG/ML IJ SOLN
5.0000 mg | Freq: Once | INTRAMUSCULAR | Status: AC | PRN
  Administered 2022-04-14: 5 mg via INTRAMUSCULAR
  Filled 2022-04-14: qty 1

## 2022-04-14 MED ORDER — ASPIRIN 81 MG PO TBEC
81.0000 mg | DELAYED_RELEASE_TABLET | Freq: Every day | ORAL | Status: DC
  Administered 2022-04-14 – 2022-04-25 (×12): 81 mg via ORAL
  Filled 2022-04-14 (×12): qty 1

## 2022-04-14 MED ORDER — INSULIN DETEMIR 100 UNIT/ML FLEXPEN: 19.0000 [IU] | PEN_INJECTOR | Freq: Every day | SUBCUTANEOUS | Status: DC

## 2022-04-14 MED ORDER — DORZOLAMIDE HCL-TIMOLOL MAL 2-0.5 % OP SOLN
1.0000 [drp] | Freq: Two times a day (BID) | OPHTHALMIC | Status: DC
  Administered 2022-04-14 – 2022-04-25 (×23): 1 [drp] via OPHTHALMIC
  Filled 2022-04-14: qty 10

## 2022-04-14 MED ORDER — AMLODIPINE BESYLATE 10 MG PO TABS
5.0000 mg | ORAL_TABLET | Freq: Every day | ORAL | Status: DC
  Administered 2022-04-14 – 2022-04-25 (×11): 5 mg via ORAL
  Filled 2022-04-14 (×12): qty 1

## 2022-04-14 MED ORDER — SODIUM CHLORIDE 0.9 % IV SOLN: INTRAVENOUS | Status: AC

## 2022-04-14 MED ORDER — INSULIN GLARGINE-YFGN 100 UNIT/ML ~~LOC~~ SOLN
19.0000 [IU] | Freq: Every day | SUBCUTANEOUS | Status: DC
  Administered 2022-04-15 – 2022-04-18 (×4): 19 [IU] via SUBCUTANEOUS
  Filled 2022-04-14 (×4): qty 0.19

## 2022-04-14 MED ORDER — BUDESONIDE 3 MG PO CPEP: 6.0000 mg | ORAL_CAPSULE | Freq: Every day | ORAL | Status: DC

## 2022-04-14 MED ORDER — METOPROLOL TARTRATE 25 MG PO TABS
25.0000 mg | ORAL_TABLET | Freq: Two times a day (BID) | ORAL | Status: DC
  Administered 2022-04-14 – 2022-04-25 (×22): 25 mg via ORAL
  Filled 2022-04-14 (×23): qty 1

## 2022-04-14 MED ORDER — DONEPEZIL HCL 10 MG PO TABS
10.0000 mg | ORAL_TABLET | Freq: Every day | ORAL | Status: DC
  Administered 2022-04-14 – 2022-04-15 (×2): 10 mg via ORAL
  Filled 2022-04-14 (×2): qty 1

## 2022-04-14 NOTE — Progress Notes (Signed)
PROGRESS NOTE    Nathan Howell  ZWC:[id number] DOB: 1935/02/14 DOA: 04/13/2022 PCP: Burnard Bunting, MD    Brief Narrative:  Nathan Howell is a 88 y.o. male with past medical history significant for chronic hyponatremia with baseline serum sodium level of 124-1 30, chronic diastolic heart failure, complete heart block status post pacemaker placement in October 2020, essential pretension, hyperlipidemia, CAD status post CABG, type 2 diabetes mellitus, anemia of chronic disease presented to hospital with altered mental status, confusion over the last 3 to 4 days with recent fall at home without any focal weakness.  Patient does have history of chronic hyponatremia ranging between 124-130 and was on HCTZ as outpatient for chronic diastolic heart failure.  At [facility] patient was noted to be afebrile.  Labs showed sodium of 118 with potassium of 3.6 and creatinine of 0.8.  Glucose was low at 47 so received IV glucose.  EKG did not show any acute ischemic changes. Noncontrast CT head, cervical spine without any acute findings.    Assessment and plan.    Acute metabolic encephalopathy:  Presenting with 3 to 4 days of confusion and somnolence likely from acute on chronic hyponatremia.  Urinalysis without signs of infection.  Possible volume depletion on normal saline with slight improvement.   Recent fall.  Continue fall precautions.  Continue hydration.  Will get PT evaluation.  Acute on chronic hypo-osmolar hyponatremia:  Secondary to hypovolemia and use of HCTZ.  Consider discontinuation of HCTZ on discharge.  Received IV fluids with normal saline with some improvement.  TSH within normal limits.  BNP at 55.  Continue normal saline for 1 more day.   Hypoglycemia  Latest hemoglobin A1c of 6.2 in November 2016.  Hold glipizide and metformin..   Holding basal insulin as well as outpatient oral hypoglycemic agents.  Can add sliding scale insulin as necessary.    acute prerenal  azotemia: Likely secondary to volume depletion.   Essential Hypertension: Hold HCTZ.  Continue beta-blocker.  Hold losartan and amlodipine and potassium sparing diuretic for now.      Hyperlipidemia:  On Crestor at home.  Currently on hold.   Chronic diastolic heart failure:  2D echocardiogram from October 2020 showed grade 1 diastolic dysfunction.  Continue intake and output charting Daily weights.      Anemia of chronic disease:  Latest hemoglobin of 13.9.     DVT prophylaxis: SCDs Start: 04/13/22 2322   Code Status:     Code Status: Full Code  Disposition: Home with home health likely in 1 to 2 days  Status is: Inpatient  Remains inpatient appropriate because: Hyponatremia, IV fluids, fall, closer monitoring.   Family Communication: None at bedside  Consultants:  None  Procedures:  None  Antimicrobials:  None  Anti-infectives (From admission, onward)    None      Subjective: Today, patient was seen and examined at bedside.  Patient feels a little better today.  Denies any nausea vomiting fever chills or rigor.  More alert awake and Communicative today.  Stated that he did have a fall recently and had difficulty ambulating.  Objective: Vitals:   04/14/22 0645 04/14/22 0800 04/14/22 0953 04/14/22 1100  BP:  (!) 146/58  130/62  Pulse: 73 77  67  Resp:  18  18  Temp:   98.1 F (36.7 C)   TempSrc:   Oral   SpO2: 100% 96%  95%  Weight:      Height:  Intake/Output Summary (Last 24 hours) at 04/14/2022 1248 Last data filed at 04/14/2022 0630 Gross per 24 hour  Intake 1389.44 ml  Output --  Net 1389.44 ml   Filed Weights   04/13/22 1408  Weight: 71.7 kg    Physical Examination: Body mass index is 21.43 kg/m.   General:  Average built, not in obvious distress, Communicative,  HENT:   No scleral pallor or icterus noted. Oral mucosa is moist.  Chest:  Clear breath sounds.  Diminished breath sounds bilaterally. No crackles or wheezes.  CVS: S1  &S2 heard. No murmur.  Regular rate and rhythm. Abdomen: Soft, nontender, nondistended.  Bowel sounds are heard.   Extremities: No cyanosis, clubbing or edema.  Peripheral pulses are palpable. Psych: Alert, awake and oriented to place and person and time., normal mood CNS:  No cranial nerve deficits.  Generalized weakness noted.  Moves all extremities, Skin: Warm and dry.  No rashes noted.  Data Reviewed:   CBC: Recent Labs  Lab 04/13/22 1446 04/14/22 0435  WBC 8.3 8.9  NEUTROABS 5.5 5.6  HGB 12.7* 13.9  HCT 34.4* 38.6*  MCV 87.8 88.3  PLT 292 924    Basic Metabolic Panel: Recent Labs  Lab 04/13/22 1446 04/13/22 2029 04/14/22 0105 04/14/22 0435 04/14/22 1046  NA 118* 120* 118* 121* 123*  K 3.6 3.4* 3.9 3.8 4.0  CL 84* 87* 90* 88* 93*  CO2 23 22 20* 21* 19*  GLUCOSE 47* 67* 145* 82 189*  BUN '22 19 17 19 16  '$ CREATININE 0.87 0.85 0.77 0.86 0.72  CALCIUM 9.2 8.7* 8.3* 9.2 8.7*  MG  --   --   --  1.7  --     Liver Function Tests: Recent Labs  Lab 04/14/22 0435  AST 30  ALT 17  ALKPHOS 43  BILITOT 0.8  PROT 6.9  ALBUMIN 4.4     Radiology Studies: DG Chest Port 1 View  Result Date: 04/14/2022 CLINICAL DATA:  Acute hyponatremia EXAM: PORTABLE CHEST 1 VIEW COMPARISON:  02/01/2019 FINDINGS: Sternotomy and CABG. Stable cardiomediastinal silhouette. No focal consolidation, pleural effusion, or pneumothorax. No acute osseous abnormality. IMPRESSION: No active disease. Electronically Signed   By: Placido Sou M.D.   On: 04/14/2022 01:23   CT Cervical Spine Wo Contrast  Result Date: 04/13/2022 CLINICAL DATA:  Trauma, fall EXAM: CT CERVICAL SPINE WITHOUT CONTRAST TECHNIQUE: Multidetector CT imaging of the cervical spine was performed without intravenous contrast. Multiplanar CT image reconstructions were also generated. RADIATION DOSE REDUCTION: This exam was performed according to the departmental dose-optimization program which includes automated exposure control,  adjustment of the mA and/or kV according to patient size and/or use of iterative reconstruction technique. COMPARISON:  None Available. FINDINGS: Alignment: Alignment of posterior margins of vertebral bodies is within normal limits. Skull base and vertebrae: No recent fracture is seen. Degenerative changes are noted. Soft tissues and spinal canal: There is thickening and calcifications in transverse ligament posterior to the odontoid process. This may be due to chronic inflammation. Posterior bony spurs are causing extrinsic pressure lower the ventral margin of thecal sac, more so at C3-C4 level. Disc levels: There is mild encroachment of neural foramina at C3-C4 level. Upper chest: Unremarkable. Other: Scattered arterial calcifications are seen. IMPRESSION: No recent fracture is seen in cervical spine.  Cervical spondylosis. Electronically Signed   By: Elmer Picker M.D.   On: 04/13/2022 16:05   CT Head Wo Contrast  Result Date: 04/13/2022 CLINICAL DATA:  Altered mental status, recent  fall EXAM: CT HEAD WITHOUT CONTRAST TECHNIQUE: Contiguous axial images were obtained from the base of the skull through the vertex without intravenous contrast. RADIATION DOSE REDUCTION: This exam was performed according to the departmental dose-optimization program which includes automated exposure control, adjustment of the mA and/or kV according to patient size and/or use of iterative reconstruction technique. COMPARISON:  01/17/2020 FINDINGS: Brain: No acute intracranial findings are seen. There are no signs of bleeding within the cranium. Cortical sulci are prominent, especially in the left parietal cortex. These findings appear stable. This may suggest encephalomalacia. There is no focal edema or mass effect. Vascular: Unremarkable. Skull: Unremarkable. Sinuses/Orbits: There is mild mucosal thickening in ethmoid and maxillary sinuses. There is fluid density in multiple right mastoid air cells. Similar finding was seen  in the previous study. Other: None. IMPRESSION: No acute intracranial findings are seen in noncontrast CT brain. Atrophy, more severe in the left parietal cortex. No significant interval changes are noted. Electronically Signed   By: Elmer Picker M.D.   On: 04/13/2022 16:00   CUP PACEART REMOTE DEVICE CHECK  Result Date: 04/12/2022 Scheduled remote reviewed. Normal device function.  AM-VP 93.5%, VP only 6.3%, A4 2.19mS2 Next remote 91 days. AV Conduction Mode Switch 0 % LA     LOS: 1 day    LFlora Lipps MD [facility] Available via Epic secure chat 7am-7pm After these hours, please refer to coverage provider listed on [email or web address] 04/14/2022, 12:48 PM

## 2022-04-14 NOTE — ED Notes (Addendum)
Pt took hospital gown off and attempted to exit room and put back on his clothing, staff currently redirecting pt.

## 2022-04-14 NOTE — ED Notes (Signed)
Pt placed in posey belt and mittens.

## 2022-04-14 NOTE — ED Notes (Signed)
Pt is not letting staff perform pt care. Pt is fighting against staff and not letting staff put pt back on vital sign monitoring devices. Hospitalist notified.

## 2022-04-14 NOTE — ED Notes (Signed)
Pt removed all vital sign monitoring devices and pulled out both IV's in place. Pt states "I'm tired of this shit so I took it out."

## 2022-04-14 NOTE — Hospital Course (Addendum)
PMH of CAD SP CABG, type II DM, HLD, HTN, corneal abrasion SP transplant present to the hospital complains of generalized weakness debility and poor p.o. intake.  Found to have hyponatremia and other electrolyte normality.  Currently being corrected.

## 2022-04-14 NOTE — ED Notes (Addendum)
ED TO INPATIENT HANDOFF REPORT  ED Nurse Name and Phone #: sherifa/Carlitos Bottino  S Name/Age/Gender Nathan Howell 88 y.o. male Room/Bed: WA24/WA24  Code Status   Code Status: Full Code  Home/SNF/Other Home Patient oriented to: self, place Is this baseline? No  Triage Complete: Triage complete  Chief Complaint Acute metabolic encephalopathy [T06.26] Acute hyponatremia [E87.1]  Triage Note Reported fall occurred on Tuesday while taking out the trash; unwitnessed fall. On the ground for 15 mins; + head injury; left elbow and laceration on left eyebrow. denies LOC when asked. Family at bedside reports hx of dementia. Concerns for decline in reflexes,  understanding, and memory.    Allergies Allergies  Allergen Reactions   Ranitidine Other (See Comments)    Level of Care/Admitting Diagnosis ED Disposition     ED Disposition  Admit   Condition  --   Comment  Hospital Area: [facility] [[id number]]  Level of Care: Progressive [102]  Admit to Progressive based on following criteria: NEUROLOGICAL AND NEUROSURGICAL complex patients with significant risk of instability, who do not meet ICU criteria, yet require close observation or frequent assessment (< / = every 2 - 4 hours) with medical / nursing intervention.  May admit patient to [facility] or [facility] if equivalent level of care is available:: No  Covid Evaluation: Asymptomatic - no recent exposure (last 10 days) testing not required  Diagnosis: Acute hyponatremia [172166]  Admitting Physician: Rhetta Mura [[id number]]  Attending Physician: Flora Lipps [[id number]]  Certification:: I certify this patient will need inpatient services for at least 2 midnights          B Medical/Surgery History Past Medical History:  Diagnosis Date   Abdominal hernia    Asthma    [place] - HFA 50% 03/16/09 > 50% 05/13/10   Collagenous colitis    COPD (chronic obstructive pulmonary disease) ([place]) 2015   Coronary  artery disease 2005   Diverticulosis    GERD (gastroesophageal reflux disease)    Glaucoma    History of stress test 02/2011   Which showed mild inferior thinning felt to be diaphragmatic attenuation artifact without ichemia. Poststress EF was 65%.   Hx of echocardiogram    EF>55% this showed moderate concentric LVH with normal systolic function. There was evidence for grade 1 diastolic relaxation abnormality with possible very small region of apical lateral hypokinesis.He did have moderate left atrial dilatation by volume assessment. There was mild mitral annular calcification with trace MR, trace TR, mild aortic valve sclerosis without stenosis     Hyperlipidemia    Hypertension    Rhinitis medicamentosa    per remote Minette Brine w/u   S/P placement of cardiac pacemaker 01/31/19 leadless  02/01/2019   Type II diabetes mellitus [facility])    Past Surgical History:  Procedure Laterality Date   CARDIAC CATHETERIZATION  02/2004   Normal left ventricular function. Significant multivessel coronary artery disease corresponding to left main equivalent disease with diffuse tubular narrowing of 50% in the proximal left anterior descending, 905 in the ramus intermadius vessel and 80% in the most proximal circumflex veseel with 70% mid A-V groove circumflex stenosis.   CATARACT EXTRACTION W/ INTRAOCULAR LENS IMPLANT Right 2003    CORNEAL TRANSPLANT Right ~ 2004   CORONARY ANGIOPLASTY     CORONARY ARTERY BYPASS GRAFT     x3 By Dr Servando Snare with LIMA to the LAD, a vein to the intermediate, a vein to the distal circumflex. [name] was nondominant and not bypassed.  GLAUCOMA VALVE INSERTION Right    HAND SURGERY Right    "don't remember what was done"   [place]   "not sure which side"   KNEE ARTHROSCOPY Bilateral    PACEMAKER LEADLESS INSERTION N/A 01/31/2019   Procedure: PACEMAKER LEADLESS INSERTION;  Surgeon: Thompson Grayer, MD;  Location: [place] [facility];  Service: Cardiovascular;   Laterality: N/A;   TEMPORARY PACEMAKER N/A 01/30/2019   Procedure: TEMPORARY PACEMAKER;  Surgeon: Burnell Blanks, MD;  Location: [place] [facility];  Service: Cardiovascular;  Laterality: N/A;     A IV Location/Drains/Wounds Patient Lines/Drains/Airways Status     Active Line/Drains/Airways     Name Placement date Placement time Site Days   Peripheral IV 04/14/22 20 G 1" Anterior;Left Forearm 04/14/22  0437  Forearm  less than 1            Intake/Output Last 24 hours  Intake/Output Summary (Last 24 hours) at 04/14/2022 1453 Last data filed at 04/14/2022 0630 Gross per 24 hour  Intake 1389.44 ml  Output --  Net 1389.44 ml    Labs/Imaging Results for orders placed or performed during the hospital encounter of 04/13/22 (from the past 48 hour(s))  CBC with Differential     Status: Abnormal   Collection Time: 04/13/22  2:46 PM  Result Value Ref Range   WBC 8.3 4.0 - 10.5 K/uL   RBC 3.92 (L) 4.22 - 5.81 MIL/uL   Hemoglobin 12.7 (L) 13.0 - 17.0 g/dL   HCT 34.4 (L) 39.0 - 52.0 %   MCV 87.8 80.0 - 100.0 fL   MCH 32.4 26.0 - 34.0 pg   MCHC 36.9 (H) 30.0 - 36.0 g/dL   RDW 12.2 11.5 - 15.5 %   Platelets 292 150 - 400 K/uL   nRBC 0.0 0.0 - 0.2 %   Neutrophils Relative % 65 %   Neutro Abs 5.5 1.7 - 7.7 K/uL   Lymphocytes Relative 23 %   Lymphs Abs 1.9 0.7 - 4.0 K/uL   Monocytes Relative 10 %   Monocytes Absolute 0.8 0.1 - 1.0 K/uL   Eosinophils Relative 1 %   Eosinophils Absolute 0.1 0.0 - 0.5 K/uL   Basophils Relative 0 %   Basophils Absolute 0.0 0.0 - 0.1 K/uL   Immature Granulocytes 1 %   Abs Immature Granulocytes 0.06 0.00 - 0.07 K/uL    Comment: Performed at [facility], [address] [place], [place] [address]  Basic metabolic panel     Status: Abnormal   Collection Time: 04/13/22  2:46 PM  Result Value Ref Range   Sodium 118 (LL) 135 - 145 mmol/L    Comment: CRITICAL RESULT CALLED TO, READ BACK BY AND VERIFIED WITH JERRI PEGRAM RN AT 1556 ON  04/13/22 BY I.SUGUT RESULTS VERIFIED BY REPEAT TESTING    Potassium 3.6 3.5 - 5.1 mmol/L   Chloride 84 (L) 98 - 111 mmol/L   CO2 23 22 - 32 mmol/L   Glucose, Bld 47 (L) 70 - 99 mg/dL    Comment: Glucose reference range applies only to samples taken after fasting for at least 8 hours.   BUN 22 8 - 23 mg/dL   Creatinine, Ser 0.87 0.61 - 1.24 mg/dL   Calcium 9.2 8.9 - 10.3 mg/dL   GFR, Estimated >60 >60 mL/min    Comment: (NOTE) Calculated using the CKD-EPI Creatinine Equation (2021)    Anion gap 11 5 - 15    Comment: Performed  at [facility], [address] [place], [place] [address]  CBG monitoring, ED     Status: Abnormal   Collection Time: 04/13/22  4:24 PM  Result Value Ref Range   Glucose-Capillary 42 (LL) 70 - 99 mg/dL    Comment: Glucose reference range applies only to samples taken after fasting for at least 8 hours.   Comment 1 Notify RN    Comment 2 Document in Chart   CBG monitoring, ED     Status: Abnormal   Collection Time: 04/13/22  4:37 PM  Result Value Ref Range   Glucose-Capillary 192 (H) 70 - 99 mg/dL    Comment: Glucose reference range applies only to samples taken after fasting for at least 8 hours.  Urinalysis, Routine w reflex microscopic     Status: Abnormal   Collection Time: 04/13/22  5:24 PM  Result Value Ref Range   Color, Urine YELLOW YELLOW   APPearance CLEAR CLEAR   Specific Gravity, Urine 1.015 1.005 - 1.030   pH 7.0 5.0 - 8.0   Glucose, UA 100 (A) NEGATIVE mg/dL   Hgb urine dipstick NEGATIVE NEGATIVE   Bilirubin Urine NEGATIVE NEGATIVE   Ketones, ur NEGATIVE NEGATIVE mg/dL   Protein, ur NEGATIVE NEGATIVE mg/dL   Nitrite NEGATIVE NEGATIVE   Leukocytes,Ua NEGATIVE NEGATIVE    Comment: Microscopic not done on urines with negative protein, blood, leukocytes, nitrite, or glucose < 500 mg/dL. Performed at [facility], [address] [place], [place] [address]   Osmolality, urine     Status: None   Collection Time:  04/13/22  5:27 PM  Result Value Ref Range   Osmolality, Ur 408 300 - 900 mOsm/kg    Comment: REPEATED TO VERIFY Performed at [facility], [address] [place], [place] [address]   CBG monitoring, ED     Status: None   Collection Time: 04/13/22  6:47 PM  Result Value Ref Range   Glucose-Capillary 88 70 - 99 mg/dL    Comment: Glucose reference range applies only to samples taken after fasting for at least 8 hours.   Comment 1 Notify RN   Basic metabolic panel     Status: Abnormal   Collection Time: 04/13/22  8:29 PM  Result Value Ref Range   Sodium 120 (L) 135 - 145 mmol/L   Potassium 3.4 (L) 3.5 - 5.1 mmol/L   Chloride 87 (L) 98 - 111 mmol/L   CO2 22 22 - 32 mmol/L   Glucose, Bld 67 (L) 70 - 99 mg/dL    Comment: Glucose reference range applies only to samples taken after fasting for at least 8 hours.   BUN 19 8 - 23 mg/dL   Creatinine, Ser 0.85 0.61 - 1.24 mg/dL   Calcium 8.7 (L) 8.9 - 10.3 mg/dL   GFR, Estimated >60 >60 mL/min    Comment: (NOTE) Calculated using the CKD-EPI Creatinine Equation (2021)    Anion gap 11 5 - 15    Comment: Performed at [facility], [address] [place], [place] [address]  CBG monitoring, ED     Status: Abnormal   Collection Time: 04/13/22 11:37 PM  Result Value Ref Range   Glucose-Capillary 51 (L) 70 - 99 mg/dL    Comment: Glucose reference range applies only to samples taken after fasting for at least 8 hours.  Hemoglobin A1c     Status: Abnormal   Collection Time: 04/14/22 12:12 AM  Result Value Ref  Range   Hgb A1c MFr Bld 6.7 (H) 4.8 - 5.6 %    Comment: (NOTE) Pre diabetes:          5.7%-6.4%  Diabetes:              >6.4%  Glycemic control for   <7.0% adults with diabetes    Mean Plasma Glucose 145.59 mg/dL    Comment: Performed at [facility] [address] [place], [place] [address]  CBG monitoring, ED     Status: Abnormal   Collection Time: 04/14/22 12:13 AM  Result Value Ref Range    Glucose-Capillary 139 (H) 70 - 99 mg/dL    Comment: Glucose reference range applies only to samples taken after fasting for at least 8 hours.  Basic metabolic panel     Status: Abnormal   Collection Time: 04/14/22  1:05 AM  Result Value Ref Range   Sodium 118 (LL) 135 - 145 mmol/L    Comment: CRITICAL RESULT CALLED TO, READ BACK BY AND VERIFIED WITH BRATU,D EMTP AT 0151 ON 04/14/22 BY [email or web address]    Potassium 3.9 3.5 - 5.1 mmol/L    Comment: HEMOLYSIS AT THIS LEVEL MAY AFFECT RESULT   Chloride 90 (L) 98 - 111 mmol/L   CO2 20 (L) 22 - 32 mmol/L   Glucose, Bld 145 (H) 70 - 99 mg/dL    Comment: Glucose reference range applies only to samples taken after fasting for at least 8 hours.   BUN 17 8 - 23 mg/dL   Creatinine, Ser 0.77 0.61 - 1.24 mg/dL   Calcium 8.3 (L) 8.9 - 10.3 mg/dL   GFR, Estimated >60 >60 mL/min    Comment: (NOTE) Calculated using the CKD-EPI Creatinine Equation (2021)    Anion gap 8 5 - 15    Comment: Performed at [facility], [place] [address] [place], [place] [address]  CK     Status: None   Collection Time: 04/14/22  1:05 AM  Result Value Ref Range   Total CK 80 49 - 397 U/L    Comment: HEMOLYSIS AT THIS LEVEL MAY AFFECT RESULT Performed at [facility] [address], [place], [place] [address]   CBG monitoring, ED     Status: Abnormal   Collection Time: 04/14/22  1:53 AM  Result Value Ref Range   Glucose-Capillary 139 (H) 70 - 99 mg/dL    Comment: Glucose reference range applies only to samples taken after fasting for at least 8 hours.  CBG monitoring, ED     Status: None   Collection Time: 04/14/22  4:34 AM  Result Value Ref Range   Glucose-Capillary 81 70 - 99 mg/dL    Comment: Glucose reference range applies only to samples taken after fasting for at least 8 hours.  CBC with Differential/Platelet     Status: Abnormal   Collection Time: 04/14/22  4:35 AM  Result Value Ref Range   WBC 8.9 4.0 - 10.5 K/uL   RBC 4.37 4.22  - 5.81 MIL/uL   Hemoglobin 13.9 13.0 - 17.0 g/dL   HCT 38.6 (L) 39.0 - 52.0 %   MCV 88.3 80.0 - 100.0 fL   MCH 31.8 26.0 - 34.0 pg   MCHC 36.0 30.0 - 36.0 g/dL   RDW 12.2 11.5 - 15.5 %   Platelets 283 150 - 400 K/uL   nRBC 0.0 0.0 - 0.2 %   Neutrophils Relative % 64 %   Neutro Abs 5.6 1.7 - 7.7  K/uL   Lymphocytes Relative 25 %   Lymphs Abs 2.2 0.7 - 4.0 K/uL   Monocytes Relative 10 %   Monocytes Absolute 0.9 0.1 - 1.0 K/uL   Eosinophils Relative 1 %   Eosinophils Absolute 0.1 0.0 - 0.5 K/uL   Basophils Relative 0 %   Basophils Absolute 0.0 0.0 - 0.1 K/uL   Immature Granulocytes 0 %   Abs Immature Granulocytes 0.03 0.00 - 0.07 K/uL    Comment: Performed at [facility], [place] [address] [place], [place] [address]  Comprehensive metabolic panel     Status: Abnormal   Collection Time: 04/14/22  4:35 AM  Result Value Ref Range   Sodium 121 (L) 135 - 145 mmol/L   Potassium 3.8 3.5 - 5.1 mmol/L   Chloride 88 (L) 98 - 111 mmol/L   CO2 21 (L) 22 - 32 mmol/L   Glucose, Bld 82 70 - 99 mg/dL    Comment: Glucose reference range applies only to samples taken after fasting for at least 8 hours.   BUN 19 8 - 23 mg/dL   Creatinine, Ser 0.86 0.61 - 1.24 mg/dL   Calcium 9.2 8.9 - 10.3 mg/dL   Total Protein 6.9 6.5 - 8.1 g/dL   Albumin 4.4 3.5 - 5.0 g/dL   AST 30 15 - 41 U/L   ALT 17 0 - 44 U/L   Alkaline Phosphatase 43 38 - 126 U/L   Total Bilirubin 0.8 0.3 - 1.2 mg/dL   GFR, Estimated >60 >60 mL/min    Comment: (NOTE) Calculated using the CKD-EPI Creatinine Equation (2021)    Anion gap 12 5 - 15    Comment: Performed at [facility], [place] [address], [place], [place] [address]  Magnesium     Status: None   Collection Time: 04/14/22  4:35 AM  Result Value Ref Range   Magnesium 1.7 1.7 - 2.4 mg/dL    Comment: Performed at [facility], [place] [address], [place], [place] [address]  Uric acid     Status: Abnormal   Collection Time:  04/14/22  4:35 AM  Result Value Ref Range   Uric Acid, Serum 2.6 (L) 3.7 - 8.6 mg/dL    Comment: HEMOLYSIS AT THIS LEVEL MAY AFFECT RESULT Performed at [place] [address] [place], [place] [address]   TSH     Status: None   Collection Time: 04/14/22  4:35 AM  Result Value Ref Range   TSH 4.015 0.350 - 4.500 uIU/mL    Comment: Performed by a 3rd Generation assay with a functional sensitivity of <=0.01 uIU/mL. Performed at [facility], [place] [address], [place], [place] [address]   Protime-INR     Status: Abnormal   Collection Time: 04/14/22  4:35 AM  Result Value Ref Range   Prothrombin Time 15.3 (H) 11.4 - 15.2 seconds   INR 1.2 0.8 - 1.2    Comment: (NOTE) INR goal varies based on device and disease states. Performed at [facility], [place] [address] [place],  [address]   Blood gas, venous     Status: Abnormal   Collection Time: 04/14/22  4:35 AM  Result Value Ref Range   pH, Ven 7.34 7.25 - 7.43   pCO2, Ven 44 44 - 60 mmHg   pO2, Ven 38 32 - 45 mmHg   Bicarbonate 23.7 20.0 - 28.0 mmol/L   Acid-base deficit 2.2 (H) 0.0 - 2.0 mmol/L  O2 Saturation 63.3 %   Patient temperature 37.0     Comment: Performed at [facility] [address] [place], [place] [address]  Brain natriuretic peptide     Status: None   Collection Time: 04/14/22  4:35 AM  Result Value Ref Range   B Natriuretic Peptide 55.0 0.0 - 100.0 pg/mL    Comment: Performed at [facility], [place] [address], [place], [place] [address]  Osmolality     Status: Abnormal   Collection Time: 04/14/22  4:35 AM  Result Value Ref Range   Osmolality 259 (L) 275 - 295 mOsm/kg    Comment: REPEATED TO VERIFY Performed at [facility], [address] [place], [place] [address]   CBG monitoring, ED     Status: None   Collection Time: 04/14/22  7:48 AM  Result Value Ref Range   Glucose-Capillary 93 70 - 99  mg/dL    Comment: Glucose reference range applies only to samples taken after fasting for at least 8 hours.  Basic metabolic panel     Status: Abnormal   Collection Time: 04/14/22 10:46 AM  Result Value Ref Range   Sodium 123 (L) 135 - 145 mmol/L   Potassium 4.0 3.5 - 5.1 mmol/L    Comment: HEMOLYSIS AT THIS LEVEL MAY AFFECT RESULT   Chloride 93 (L) 98 - 111 mmol/L   CO2 19 (L) 22 - 32 mmol/L   Glucose, Bld 189 (H) 70 - 99 mg/dL    Comment: Glucose reference range applies only to samples taken after fasting for at least 8 hours.   BUN 16 8 - 23 mg/dL   Creatinine, Ser 0.72 0.61 - 1.24 mg/dL   Calcium 8.7 (L) 8.9 - 10.3 mg/dL   GFR, Estimated >60 >60 mL/min    Comment: (NOTE) Calculated using the CKD-EPI Creatinine Equation (2021)    Anion gap 11 5 - 15    Comment: Performed at [facility], [place] [address], [place], [place] [address]   DG Chest Port 1 View  Result Date: 04/14/2022 CLINICAL DATA:  Acute hyponatremia EXAM: PORTABLE CHEST 1 VIEW COMPARISON:  02/01/2019 FINDINGS: Sternotomy and CABG. Stable cardiomediastinal silhouette. No focal consolidation, pleural effusion, or pneumothorax. No acute osseous abnormality. IMPRESSION: No active disease. Electronically Signed   By: Placido Sou M.D.   On: 04/14/2022 01:23   CT Cervical Spine Wo Contrast  Result Date: 04/13/2022 CLINICAL DATA:  Trauma, fall EXAM: CT CERVICAL SPINE WITHOUT CONTRAST TECHNIQUE: Multidetector CT imaging of the cervical spine was performed without intravenous contrast. Multiplanar CT image reconstructions were also generated. RADIATION DOSE REDUCTION: This exam was performed according to the departmental dose-optimization program which includes automated exposure control, adjustment of the mA and/or kV according to patient size and/or use of iterative reconstruction technique. COMPARISON:  None Available. FINDINGS: Alignment: Alignment of posterior margins of vertebral bodies is within normal  limits. Skull base and vertebrae: No recent fracture is seen. Degenerative changes are noted. Soft tissues and spinal canal: There is thickening and calcifications in transverse ligament posterior to the odontoid process. This may be due to chronic inflammation. Posterior bony spurs are causing extrinsic pressure lower the ventral margin of thecal sac, more so at C3-C4 level. Disc levels: There is mild encroachment of neural foramina at C3-C4 level. Upper chest: Unremarkable. Other: Scattered arterial calcifications are seen. IMPRESSION: No recent fracture is seen in cervical spine.  Cervical spondylosis. Electronically Signed   By: Prudy Feeler.D.  On: 04/13/2022 16:05   CT Head Wo Contrast  Result Date: 04/13/2022 CLINICAL DATA:  Altered mental status, recent fall EXAM: CT HEAD WITHOUT CONTRAST TECHNIQUE: Contiguous axial images were obtained from the base of the skull through the vertex without intravenous contrast. RADIATION DOSE REDUCTION: This exam was performed according to the departmental dose-optimization program which includes automated exposure control, adjustment of the mA and/or kV according to patient size and/or use of iterative reconstruction technique. COMPARISON:  01/17/2020 FINDINGS: Brain: No acute intracranial findings are seen. There are no signs of bleeding within the cranium. Cortical sulci are prominent, especially in the left parietal cortex. These findings appear stable. This may suggest encephalomalacia. There is no focal edema or mass effect. Vascular: Unremarkable. Skull: Unremarkable. Sinuses/Orbits: There is mild mucosal thickening in ethmoid and maxillary sinuses. There is fluid density in multiple right mastoid air cells. Similar finding was seen in the previous study. Other: None. IMPRESSION: No acute intracranial findings are seen in noncontrast CT brain. Atrophy, more severe in the left parietal cortex. No significant interval changes are noted. Electronically  Signed   By: Elmer Picker M.D.   On: 04/13/2022 16:00   CUP PACEART REMOTE DEVICE CHECK  Result Date: 04/12/2022 Scheduled remote reviewed. Normal device function.  AM-VP 93.5%, VP only 6.3%, A4 2.68mS2 Next remote 91 days. AV Conduction Mode Switch 0 % LA   Pending Labs Unresulted Labs (From admission, onward)     Start     Ordered   04/15/22 0500  CBC  Tomorrow morning,   R        04/14/22 1252   04/15/22 0500  Magnesium  Tomorrow morning,   R        04/14/22 1252   04/14/22 17062 Basic metabolic panel  5A & 5P,   R      04/14/22 1252   04/14/22 0010  Rapid urine drug screen (hospital performed)  Add-on,   AD        04/14/22 0010   04/14/22 0008  Creatinine, urine, random  Add-on,   AD        04/14/22 0008   04/13/22 1639  Na and K (sodium & potassium), rand urine  Once,   URGENT        04/13/22 1638   04/13/22 1427  Urine Culture  Once,   URGENT       Question:  Indication  Answer:  Altered mental status (if no other cause identified)   04/13/22 1427            Vitals/Pain Today's Vitals   04/14/22 0953 04/14/22 1100 04/14/22 1300 04/14/22 1400  BP:  130/62 (!) 166/68 131/64  Pulse:  67 67 71  Resp:  18  18  Temp: 98.1 F (36.7 C)     TempSrc: Oral     SpO2:  95% 100% 95%  Weight:      Height:      PainSc:        Isolation Precautions No active isolations  Medications Medications  acetaminophen (TYLENOL) tablet 650 mg (has no administration in time range)    Or  acetaminophen (TYLENOL) suppository 650 mg (has no administration in time range)  melatonin tablet 3 mg (has no administration in time range)  dextrose 50 % solution 50 mL (50 mLs Intravenous Given 04/13/22 2341)  0.9 %  sodium chloride infusion (0 mLs Intravenous Stopped 04/14/22 0914)  metoprolol tartrate (LOPRESSOR) tablet 25 mg (25 mg Oral Given 04/14/22 0921)  0.9 %  sodium chloride infusion ( Intravenous New Bag/Given 04/14/22 1346)  sodium chloride 0.9 % bolus 1,000 mL (0 mLs  Intravenous Stopped 04/13/22 1850)  dextrose 50 % solution 50 mL (50 mLs Intravenous Given 04/13/22 1629)  haloperidol lactate (HALDOL) injection 5 mg (5 mg Intramuscular Given 04/14/22 0436)    Mobility walks High fall risk   Focused Assessments     R Recommendations: See Admitting Provider Note  Report given to:   Additional Notes:

## 2022-04-14 NOTE — ED Notes (Signed)
Pt has mittens on and bed alarm is under pt and on as well.

## 2022-04-15 DIAGNOSIS — D638 Anemia in other chronic diseases classified elsewhere: Secondary | ICD-10-CM | POA: Diagnosis not present

## 2022-04-15 DIAGNOSIS — G9341 Metabolic encephalopathy: Secondary | ICD-10-CM | POA: Diagnosis not present

## 2022-04-15 DIAGNOSIS — N19 Unspecified kidney failure: Secondary | ICD-10-CM | POA: Diagnosis not present

## 2022-04-15 DIAGNOSIS — E871 Hypo-osmolality and hyponatremia: Secondary | ICD-10-CM | POA: Diagnosis not present

## 2022-04-15 LAB — CBC
HCT: 37.2 % — ABNORMAL LOW (ref 39.0–52.0)
Hemoglobin: 13.4 g/dL (ref 13.0–17.0)
MCH: 31.8 pg (ref 26.0–34.0)
MCHC: 36 g/dL (ref 30.0–36.0)
MCV: 88.4 fL (ref 80.0–100.0)
Platelets: 231 10*3/uL (ref 150–400)
RBC: 4.21 MIL/uL — ABNORMAL LOW (ref 4.22–5.81)
RDW: 12.1 % (ref 11.5–15.5)
WBC: 6.4 10*3/uL (ref 4.0–10.5)
nRBC: 0 % (ref 0.0–0.2)

## 2022-04-15 LAB — BASIC METABOLIC PANEL
Anion gap: 9 (ref 5–15)
Anion gap: 11 (ref 5–15)
BUN: 11 mg/dL (ref 8–23)
BUN: 10 mg/dL (ref 8–23)
CO2: 23 mmol/L (ref 22–32)
CO2: 20 mmol/L — ABNORMAL LOW (ref 22–32)
Calcium: 8.7 mg/dL — ABNORMAL LOW (ref 8.9–10.3)
Calcium: 8.3 mg/dL — ABNORMAL LOW (ref 8.9–10.3)
Chloride: 92 mmol/L — ABNORMAL LOW (ref 98–111)
Chloride: 92 mmol/L — ABNORMAL LOW (ref 98–111)
Creatinine, Ser: 0.69 mg/dL (ref 0.61–1.24)
Creatinine, Ser: 0.7 mg/dL (ref 0.61–1.24)
GFR, Estimated: >60 mL/min (ref >60)
GFR, Estimated: >60 mL/min (ref >60)
Glucose, Bld: 198 mg/dL — ABNORMAL HIGH (ref 70–99)
Glucose, Bld: 235 mg/dL — ABNORMAL HIGH (ref 70–99)
Potassium: 3.3 mmol/L — ABNORMAL LOW (ref 3.5–5.1)
Potassium: 3.4 mmol/L — ABNORMAL LOW (ref 3.5–5.1)
Sodium: 124 mmol/L — ABNORMAL LOW (ref 135–145)
Sodium: 123 mmol/L — ABNORMAL LOW (ref 135–145)

## 2022-04-15 LAB — MAGNESIUM: Magnesium: 1.7 mg/dL (ref 1.7–2.4)

## 2022-04-15 LAB — GLUCOSE, CAPILLARY
Glucose-Capillary: 184 mg/dL — ABNORMAL HIGH (ref 70–99)
Glucose-Capillary: 179 mg/dL — ABNORMAL HIGH (ref 70–99)
Glucose-Capillary: 219 mg/dL — ABNORMAL HIGH (ref 70–99)
Glucose-Capillary: 250 mg/dL — ABNORMAL HIGH (ref 70–99)

## 2022-04-15 LAB — URINE CULTURE: Culture: NO GROWTH

## 2022-04-15 MED ORDER — POLYVINYL ALCOHOL 1.4 % OP SOLN: 1.0000 [drp] | Freq: Three times a day (TID) | OPHTHALMIC | Status: DC | PRN

## 2022-04-15 MED ORDER — POTASSIUM CHLORIDE 10 MEQ/100ML IV SOLN
10.0000 meq | INTRAVENOUS | Status: AC
  Administered 2022-04-15 (×4): 10 meq via INTRAVENOUS
  Filled 2022-04-15 (×4): qty 100

## 2022-04-15 MED ORDER — POTASSIUM CHLORIDE CRYS ER 20 MEQ PO TBCR
40.0000 meq | EXTENDED_RELEASE_TABLET | Freq: Once | ORAL | Status: AC
  Administered 2022-04-15: 40 meq via ORAL
  Filled 2022-04-15: qty 2

## 2022-04-15 MED ORDER — SODIUM CHLORIDE 0.9 % IV SOLN: INTRAVENOUS | Status: AC

## 2022-04-15 MED ORDER — GATIFLOXACIN 0.5 % OP SOLN
1.0000 [drp] | Freq: Four times a day (QID) | OPHTHALMIC | Status: DC
  Administered 2022-04-15 – 2022-04-25 (×38): 1 [drp] via OPHTHALMIC
  Filled 2022-04-15: qty 2.5

## 2022-04-15 MED ORDER — INSULIN ASPART 100 UNIT/ML IJ SOLN
0.0000 [IU] | Freq: Three times a day (TID) | INTRAMUSCULAR | Status: DC
  Administered 2022-04-16 (×2): 2 [IU] via SUBCUTANEOUS
  Administered 2022-04-17: 1 [IU] via SUBCUTANEOUS
  Administered 2022-04-17: 2 [IU] via SUBCUTANEOUS
  Administered 2022-04-17: 1 [IU] via SUBCUTANEOUS
  Administered 2022-04-18: 3 [IU] via SUBCUTANEOUS
  Administered 2022-04-18: 2 [IU] via SUBCUTANEOUS
  Administered 2022-04-19 (×2): 3 [IU] via SUBCUTANEOUS
  Administered 2022-04-20: 2 [IU] via SUBCUTANEOUS
  Administered 2022-04-20: 1 [IU] via SUBCUTANEOUS
  Administered 2022-04-20: 3 [IU] via SUBCUTANEOUS
  Administered 2022-04-21: 1 [IU] via SUBCUTANEOUS
  Administered 2022-04-21: 2 [IU] via SUBCUTANEOUS

## 2022-04-15 MED ORDER — ERYTHROMYCIN 5 MG/GM OP OINT
1.0000 | TOPICAL_OINTMENT | OPHTHALMIC | Status: DC
  Filled 2022-04-15: qty 1

## 2022-04-15 MED ORDER — DONEPEZIL HCL 10 MG PO TABS
10.0000 mg | ORAL_TABLET | Freq: Every day | ORAL | Status: DC
  Administered 2022-04-16 – 2022-04-24 (×9): 10 mg via ORAL
  Filled 2022-04-15 (×10): qty 1

## 2022-04-15 MED ORDER — DOXYCYCLINE HYCLATE 100 MG PO TABS
100.0000 mg | ORAL_TABLET | Freq: Two times a day (BID) | ORAL | Status: DC
  Administered 2022-04-15 – 2022-04-18 (×6): 100 mg via ORAL
  Filled 2022-04-15 (×6): qty 1

## 2022-04-15 MED ORDER — PROPYLENE GLYCOL 0.6 % OP SOLN: 1.0000 [drp] | OPHTHALMIC | Status: DC

## 2022-04-15 MED ORDER — INSULIN ASPART 100 UNIT/ML IJ SOLN
0.0000 [IU] | Freq: Every day | INTRAMUSCULAR | Status: DC
  Administered 2022-04-15: 2 [IU] via SUBCUTANEOUS

## 2022-04-15 MED ORDER — HALOPERIDOL LACTATE 5 MG/ML IJ SOLN
2.5000 mg | Freq: Once | INTRAMUSCULAR | Status: AC
  Administered 2022-04-15: 2.5 mg via INTRAVENOUS
  Filled 2022-04-15: qty 1

## 2022-04-15 NOTE — Progress Notes (Signed)
PROGRESS NOTE    Nathan Howell  JQZ:[id number] DOB: 1934-08-25 DOA: 04/13/2022 PCP: Burnard Bunting, MD    Brief Narrative:  Nathan Howell is a 88 y.o. male with past medical history significant for chronic hyponatremia with baseline serum sodium level of 124-1 30, chronic diastolic heart failure, complete heart block status post pacemaker placement in October 2020, essential pretension, hyperlipidemia, CAD status post CABG, type 2 diabetes mellitus, anemia of chronic disease presented to hospital with altered mental status, confusion over the last 3 to 4 days with recent fall at home without any focal weakness.  Patient does have history of chronic hyponatremia ranging between 124-130 and was on HCTZ as outpatient for chronic diastolic heart failure.  At [facility] patient was noted to be afebrile.  Labs showed sodium of 118 with potassium of 3.6 and creatinine of 0.8.  Glucose was low at 47 so received IV glucose.  EKG did not show any acute ischemic changes. Noncontrast CT head, cervical spine without any acute findings.    Assessment and plan.    Acute metabolic encephalopathy:  Presenting with 3 to 4 days of confusion and somnolence likely from acute on chronic hyponatremia.  Urinalysis without signs of infection.  Possible volume depletion on normal saline with slight improvement. Needed some haldol due to agitation and was somnolent at the time of evaluation..   Recent fall.  Continue fall precautions.  Continue hydration.  Will get PT evaluation.  Patient might need rehabilitation placement.  Family wishes skilled nursing facility.  Acute on chronic hypo-osmolar hyponatremia:  Secondary to hypovolemia and use of HCTZ.  Consider discontinuation of HCTZ on discharge.  Received IV fluids with normal saline with some improvement.  TSH within normal limits.  BNP at 55.  Continue normal saline for 1 more day.  Latest sodium of 123.   Hypoglycemia  Latest hemoglobin A1c of  6.2 in November 2016.  Hold glipizide and metformin.  Restarted basal insulin, add SSI    acute prerenal azotemia: Likely secondary to volume depletion.  Improved improved   Essential Hypertension: Hold HCTZ.  Continue beta-blocker amlodipine.  Continue to hold losartan and potassium sparing diuretic.   Hyperlipidemia:  On Crestor at home.  Currently on hold.   Chronic diastolic heart failure:  2D echocardiogram from October 2020 showed grade 1 diastolic dysfunction.  Continue intake and output charting Daily weights.      Anemia of chronic disease:  Latest hemoglobin of 13.9.  History of corneal abrasion/infection on eyedrops.     DVT prophylaxis: SCDs Start: 04/13/22 2322   Code Status:     Code Status: Full Code  Disposition: Family wishes skilled nursing facility.  PT evaluation pending.    Status is: Inpatient  Remains inpatient appropriate because: Hyponatremia, IV fluids, fall, closer monitoring, possible need for rehabitation.   Family Communication: Spoke with the patient's son at bedside  Consultants:  None  Procedures:  None  Antimicrobials:  None  Anti-infectives (From admission, onward)    Start     Dose/Rate Route Frequency Ordered Stop   04/15/22 2200  doxycycline (VIBRA-TABS) tablet 100 mg        100 mg Oral 2 times daily 04/15/22 1532        Subjective: Today, patient was seen and examined at bedside.  Was little lethargic after he needed some Haldol overnight for agitation.  Subsequently was a little more alert and awake.  Patient's son at bedside.    Objective: Vitals:  04/15/22 0240 04/15/22 0525 04/15/22 0900 04/15/22 1235  BP: (!) 152/73 139/72  (!) 143/76  Pulse: 69 67  75  Resp: '18 18 20 16  '$ Temp: 98.2 F (36.8 C) 97.9 F (36.6 C)  97.8 F (36.6 C)  TempSrc: Oral Oral  Oral  SpO2: 99% 99%  99%  Weight:      Height:        Intake/Output Summary (Last 24 hours) at 04/15/2022 1719 Last data filed at 04/15/2022 1500 Gross per 24  hour  Intake 1615.55 ml  Output 1750 ml  Net -134.45 ml    Filed Weights   04/13/22 1408  Weight: 71.7 kg    Physical Examination: Body mass index is 21.43 kg/m.   General: Mildly somnolent at the time of my examination, elderly male, average built, HENT:   No scleral pallor or icterus noted. Oral mucosa is moist.  Chest:  Diminished breath sounds bilaterally. No crackles or wheezes.  CVS: S1 &S2 heard. No murmur.  Regular rate and rhythm. Abdomen: Soft, nontender, nondistended.  Bowel sounds are heard.   Extremities: No cyanosis, clubbing or edema.  Peripheral pulses are palpable. Psych: Mildly somnolent CNS: Mildly somnolent, moves all extremities Skin: Warm and dry.  No rashes noted.  Data Reviewed:   CBC: Recent Labs  Lab 04/13/22 1446 04/14/22 0435 04/15/22 0536  WBC 8.3 8.9 6.4  NEUTROABS 5.5 5.6  --   HGB 12.7* 13.9 13.4  HCT 34.4* 38.6* 37.2*  MCV 87.8 88.3 88.4  PLT 292 283 231     Basic Metabolic Panel: Recent Labs  Lab 04/14/22 0435 04/14/22 1046 04/14/22 1541 04/15/22 0536 04/15/22 1310  NA 121* 123* 124* 124* 123*  K 3.8 4.0 3.8 3.3* 3.4*  CL 88* 93* 95* 92* 92*  CO2 21* 19* 20* 23 20*  GLUCOSE 82 189* 240* 198* 235*  BUN '19 16 15 11 10  '$ CREATININE 0.86 0.72 0.81 0.69 0.70  CALCIUM 9.2 8.7* 7.7* 8.7* 8.3*  MG 1.7  --   --  1.7  --      Liver Function Tests: Recent Labs  Lab 04/14/22 0435  AST 30  ALT 17  ALKPHOS 43  BILITOT 0.8  PROT 6.9  ALBUMIN 4.4      Radiology Studies: DG Chest Port 1 View  Result Date: 04/14/2022 CLINICAL DATA:  Acute hyponatremia EXAM: PORTABLE CHEST 1 VIEW COMPARISON:  02/01/2019 FINDINGS: Sternotomy and CABG. Stable cardiomediastinal silhouette. No focal consolidation, pleural effusion, or pneumothorax. No acute osseous abnormality. IMPRESSION: No active disease. Electronically Signed   By: Placido Sou M.D.   On: 04/14/2022 01:23      LOS: 2 days    Flora Lipps, MD [facility] Available via Epic secure chat 7am-7pm After these hours, please refer to coverage provider listed on [email or web address] 04/15/2022, 5:19 PM

## 2022-04-15 NOTE — Progress Notes (Signed)
CROSS COVER NOTE  NAME: BRAYLON GRENDA MRN: [id number] DOB : 09/17/1934    Date of Service   04/15/2022   HPI/Events of Note   Notified by nursing that patient is agitated, restless, and confused.   Patient only oriented to self.  He was admitted for acute metabolic encephalopathy likely secondary to acute on chronic hyponatremia.  Despite wearing safety mitts, patient continues to remove medical equipment and IVs. Patient has attempted to get out of bed multiple times.  High fall risk.  Sitter in place but is unable to redirect patient.   Will trial low-dose IV Haldol.  Additionally, for the safety of the patient, lapbelt restraint has been placed. Restraint to be discontinued as soon as patient is able to safely have them removed.    Interventions/ Plan   IV Haldol Restraint       Raenette Rover, DNP, [place]

## 2022-04-15 NOTE — Progress Notes (Signed)
Patient drowsy this AM, but arousable. Patient unable to accept PO medications. MD notified. Morning meds held. IV potassium ordered and administered.

## 2022-04-15 NOTE — Progress Notes (Signed)
PT Cancellation Note  Patient Details Name: Nathan Howell MRN: [id number] DOB: 09/20/1934   Cancelled Treatment:    Reason Eval/Treat Not Completed: lethargy limiting ability to participate [address] Office [phone] Weekend pager-[phone]    Claretha Cooper 04/15/2022, 3:50 PM

## 2022-04-16 DIAGNOSIS — G9341 Metabolic encephalopathy: Secondary | ICD-10-CM | POA: Diagnosis not present

## 2022-04-16 DIAGNOSIS — D638 Anemia in other chronic diseases classified elsewhere: Secondary | ICD-10-CM | POA: Diagnosis not present

## 2022-04-16 DIAGNOSIS — E871 Hypo-osmolality and hyponatremia: Secondary | ICD-10-CM | POA: Diagnosis not present

## 2022-04-16 DIAGNOSIS — E876 Hypokalemia: Secondary | ICD-10-CM | POA: Insufficient documentation

## 2022-04-16 DIAGNOSIS — N19 Unspecified kidney failure: Secondary | ICD-10-CM | POA: Diagnosis not present

## 2022-04-16 LAB — CBC
HCT: 36.2 % — ABNORMAL LOW (ref 39.0–52.0)
Hemoglobin: 12.8 g/dL — ABNORMAL LOW (ref 13.0–17.0)
MCH: 32.8 pg (ref 26.0–34.0)
MCHC: 35.4 g/dL (ref 30.0–36.0)
MCV: 92.8 fL (ref 80.0–100.0)
Platelets: 239 10*3/uL (ref 150–400)
RBC: 3.9 MIL/uL — ABNORMAL LOW (ref 4.22–5.81)
RDW: 12.4 % (ref 11.5–15.5)
WBC: 6.3 10*3/uL (ref 4.0–10.5)
nRBC: 0 % (ref 0.0–0.2)

## 2022-04-16 LAB — BASIC METABOLIC PANEL
Anion gap: 7 (ref 5–15)
BUN: 9 mg/dL (ref 8–23)
CO2: 20 mmol/L — ABNORMAL LOW (ref 22–32)
Calcium: 8 mg/dL — ABNORMAL LOW (ref 8.9–10.3)
Chloride: 95 mmol/L — ABNORMAL LOW (ref 98–111)
Creatinine, Ser: 0.57 mg/dL — ABNORMAL LOW (ref 0.61–1.24)
GFR, Estimated: >60 mL/min (ref >60)
Glucose, Bld: 151 mg/dL — ABNORMAL HIGH (ref 70–99)
Potassium: 3.2 mmol/L — ABNORMAL LOW (ref 3.5–5.1)
Sodium: 122 mmol/L — ABNORMAL LOW (ref 135–145)

## 2022-04-16 LAB — GLUCOSE, CAPILLARY
Glucose-Capillary: 156 mg/dL — ABNORMAL HIGH (ref 70–99)
Glucose-Capillary: 107 mg/dL — ABNORMAL HIGH (ref 70–99)
Glucose-Capillary: 193 mg/dL — ABNORMAL HIGH (ref 70–99)
Glucose-Capillary: 190 mg/dL — ABNORMAL HIGH (ref 70–99)

## 2022-04-16 LAB — MAGNESIUM: Magnesium: 1.4 mg/dL — ABNORMAL LOW (ref 1.7–2.4)

## 2022-04-16 MED ORDER — POTASSIUM CHLORIDE 10 MEQ/100ML IV SOLN
10.0000 meq | INTRAVENOUS | Status: AC
  Administered 2022-04-16 (×4): 10 meq via INTRAVENOUS
  Filled 2022-04-16 (×4): qty 100

## 2022-04-16 MED ORDER — MAGNESIUM SULFATE 2 GM/50ML IV SOLN
2.0000 g | Freq: Once | INTRAVENOUS | Status: AC
  Administered 2022-04-16: 2 g via INTRAVENOUS
  Filled 2022-04-16: qty 50

## 2022-04-16 MED ORDER — POLYETHYLENE GLYCOL 3350 17 G PO PACK
17.0000 g | PACK | Freq: Every day | ORAL | Status: DC
  Administered 2022-04-16 – 2022-04-25 (×9): 17 g via ORAL
  Filled 2022-04-16 (×10): qty 1

## 2022-04-16 MED ORDER — SENNOSIDES-DOCUSATE SODIUM 8.6-50 MG PO TABS
1.0000 | ORAL_TABLET | Freq: Two times a day (BID) | ORAL | Status: DC
  Administered 2022-04-16 – 2022-04-25 (×18): 1 via ORAL
  Filled 2022-04-16 (×18): qty 1

## 2022-04-16 MED ORDER — POTASSIUM CHLORIDE CRYS ER 20 MEQ PO TBCR
40.0000 meq | EXTENDED_RELEASE_TABLET | Freq: Once | ORAL | Status: AC
  Administered 2022-04-16: 40 meq via ORAL
  Filled 2022-04-16: qty 2

## 2022-04-16 MED ORDER — MAGNESIUM OXIDE -MG SUPPLEMENT 400 (240 MG) MG PO TABS
400.0000 mg | ORAL_TABLET | Freq: Two times a day (BID) | ORAL | Status: DC
  Administered 2022-04-16 – 2022-04-25 (×19): 400 mg via ORAL
  Filled 2022-04-16 (×19): qty 1

## 2022-04-16 NOTE — Plan of Care (Signed)

## 2022-04-16 NOTE — Progress Notes (Addendum)
PROGRESS NOTE    Nathan Howell  AJO:[id number] DOB: December 07, 1934 DOA: 04/13/2022 PCP: Burnard Bunting, MD    Brief Narrative:   Nathan Howell is a 88 y.o. male with past medical history significant for chronic hyponatremia with baseline serum sodium level of 947-096, chronic diastolic heart failure, complete heart block status post pacemaker placement in October 2020, essential pretension, hyperlipidemia, CAD status post CABG, type 2 diabetes mellitus, anemia of chronic disease presented to hospital with altered mental status, confusion over the last 3 to 4 days with recent fall at home without any focal weakness.  Patient does have history of chronic hyponatremia ranging between 124-130 and was on HCTZ as outpatient for chronic diastolic heart failure.  At [facility], patient was noted to be afebrile.  Labs showed sodium of 118 with potassium of 3.6 and creatinine of 0.8.  Glucose was low at 47 so received IV glucose.  EKG did not show any acute ischemic changes. Noncontrast CT head, cervical spine without any acute findings.    Assessment and plan.   Acute metabolic encephalopathy:  Presenting with 3 to 4 days of confusion and somnolence likely from acute on chronic hyponatremia.  Urinalysis without signs of infection.  Possible volume depletion on normal saline with slight improvement. Needed some haldol due to agitation and was somnolent at the time of evaluation. Now more stable.  Recent fall.  Continue fall precautions.  Continue hydration.  Pending PT evaluation.  Patient might need rehabilitation placement.  Family wishes skilled nursing facility.  Hypomagnesemia.  Will replenish with oral magnesium oxide and magnesium sulfate.  Check levels in AM.  Hypokalemia.  Will replenish with IV and oral potassium supplements.  Check levels in AM.  Acute on chronic hypo-osmolar hyponatremia:  Secondary to hypovolemia and use of HCTZ.  Consider discontinuation of HCTZ on  discharge.  Received IV fluids with normal saline with some improvement.  TSH within normal limits.  BNP at 55.  Continue normal saline for 1 more day.  Latest sodium of 122.   Hypoglycemia  Latest hemoglobin A1c of 6.2 in November 2016.  Hold glipizide and metformin.  Restarted basal insulin, add SSI. POC glucose was 107.    acute prerenal azotemia: Likely secondary to volume depletion.  Improved    Essential Hypertension: Hold HCTZ.  Continue beta-blocker amlodipine.  Continue to hold losartan and potassium sparing diuretic.   Hyperlipidemia:  On Crestor at home.  Currently on hold.   Chronic diastolic heart failure:  2D echocardiogram from October 2020 showed grade 1 diastolic dysfunction.  Continue intake and output charting Daily weights.      Anemia of chronic disease:  Latest hemoglobin of 10.8  History of corneal abrasion/infection on eyedrops.  Weakness, debility, falls.  PT evaluation pending.     DVT prophylaxis: SCDs Start: 04/13/22 2322   Code Status:     Code Status: Full Code  Disposition: Family wishes skilled nursing facility.  PT evaluation pending.    Status is: Inpatient  Remains inpatient appropriate because: Hyponatremia, IV fluids, fall, closer monitoring, possible need for rehabitation.   Family Communication: Spoke with the patient's son at bedside on 04/15/2022 and spoke with the patient's daughter on 04/16/2022  Consultants:  None  Procedures:  None  Antimicrobials:  None  Anti-infectives (From admission, onward)    Start     Dose/Rate Route Frequency Ordered Stop   04/15/22 2200  doxycycline (VIBRA-TABS) tablet 100 mg        100  mg Oral 2 times daily 04/15/22 1532        Subjective: Today, patient was seen and examined at bedside.  He is more alert awake and oriented and communicative today.  Sitter at bedside but was not agitated overnight.    Objective: Vitals:   04/15/22 1235 04/15/22 2040 04/16/22 0500 04/16/22 0515  BP: (!)  143/76 (!) 127/56  (!) 140/69  Pulse: 75 64  62  Resp: '16 14  16  '$ Temp: 97.8 F (36.6 C) 97.8 F (36.6 C)  97.7 F (36.5 C)  TempSrc: Oral Oral  Oral  SpO2: 99% 98%  97%  Weight:   69.7 kg   Height:        Intake/Output Summary (Last 24 hours) at 04/16/2022 1400 Last data filed at 04/16/2022 1230 Gross per 24 hour  Intake 2221.56 ml  Output 2250 ml  Net -28.44 ml    Filed Weights   04/13/22 1408 04/16/22 0500  Weight: 71.7 kg 69.7 kg    Physical Examination: Body mass index is 20.84 kg/m.   General: Patient is alert awake and Communicative, elderly male, average built, HENT:   No scleral pallor or icterus noted. Oral mucosa is moist.   Chest: Decreased breath sounds bilaterally. CVS: S1 &S2 heard. No murmur.  Regular rate and rhythm. Abdomen: Soft, nontender, nondistended.  Bowel sounds are heard.   Extremities: No cyanosis, clubbing or edema.  Peripheral pulses are palpable. Psych: Alert awake and oriented x 3. CNS: Alert awake, moves all extremities, Skin: Warm and dry.  No rashes noted.  Data Reviewed:   CBC: Recent Labs  Lab 04/13/22 1446 04/14/22 0435 04/15/22 0536 04/16/22 0437  WBC 8.3 8.9 6.4 6.3  NEUTROABS 5.5 5.6  --   --   HGB 12.7* 13.9 13.4 12.8*  HCT 34.4* 38.6* 37.2* 36.2*  MCV 87.8 88.3 88.4 92.8  PLT 292 283 231 239     Basic Metabolic Panel: Recent Labs  Lab 04/14/22 0435 04/14/22 1046 04/14/22 1541 04/15/22 0536 04/15/22 1310 04/16/22 0437  NA 121* 123* 124* 124* 123* 122*  K 3.8 4.0 3.8 3.3* 3.4* 3.2*  CL 88* 93* 95* 92* 92* 95*  CO2 21* 19* 20* 23 20* 20*  GLUCOSE 82 189* 240* 198* 235* 151*  BUN '19 16 15 11 10 9  '$ CREATININE 0.86 0.72 0.81 0.69 0.70 0.57*  CALCIUM 9.2 8.7* 7.7* 8.7* 8.3* 8.0*  MG 1.7  --   --  1.7  --  1.4*     Liver Function Tests: Recent Labs  Lab 04/14/22 0435  AST 30  ALT 17  ALKPHOS 43  BILITOT 0.8  PROT 6.9  ALBUMIN 4.4      Radiology Studies: No results found.    LOS: 3 days     Flora Lipps, MD [facility] Available via Epic secure chat 7am-7pm After these hours, please refer to coverage provider listed on [email or web address] 04/16/2022, 2:00 PM

## 2022-04-16 NOTE — Evaluation (Signed)
Physical Therapy Evaluation Patient Details Name: Nathan Howell MRN: [id number] DOB: 1934-12-09 Today's Date: 04/16/2022  History of Present Illness  88 yo male admitted with acute met encephalopathy, fall at home. Hx of dementia, DM, COPD, asthma, CAD, CABG, chronic diarrhea, pacemaker, anemia, CHF  Clinical Impression  On eval, pt required Min A for mobility. He walked ~3 feet forwards then backwards with RW. He also took several side steps along the beside. No LOB with RW. Pt participated well. He followed 1 step commands well. Assisted pt back to bed at end of session with sitter present in room. Will plan to follow and progress activity as tolerated. No family was present during session. At this time, PT recommendation is for ST SNF rehab.        Recommendations for follow up therapy are one component of a multi-disciplinary discharge planning process, led by the attending physician.  Recommendations may be updated based on patient status, additional functional criteria and insurance authorization.  Follow Up Recommendations Skilled nursing-short term rehab (<3 hours/day) Can patient physically be transported by private vehicle: Yes    Assistance Recommended at Discharge Frequent or constant Supervision/Assistance  Patient can return home with the following  A little help with walking and/or transfers;A little help with bathing/dressing/bathroom;Assistance with cooking/housework;Assist for transportation;Help with stairs or ramp for entrance    Equipment Recommendations  (continuing to assess)  Recommendations for Other Services       Functional Status Assessment Patient has had a recent decline in their functional status and demonstrates the ability to make significant improvements in function in a reasonable and predictable amount of time.     Precautions / Restrictions Precautions Precautions: Fall Restrictions Weight Bearing Restrictions: No      Mobility  Bed  Mobility Overal bed mobility: Needs Assistance Bed Mobility: Supine to Sit, Sit to Supine     Supine to sit: HOB elevated, Min guard Sit to supine: HOB elevated, Min guard   General bed mobility comments: Close Min guard A. Pt relied on bedrail. Increased time. Cues provided.    Transfers Overall transfer level: Needs assistance Equipment used: Rolling walker (2 wheels) Transfers: Sit to/from Stand Sit to Stand: Min assist           General transfer comment: Assist to power up, stabilize, control descent. Cues for safety. Increased time.    Ambulation/Gait Ambulation/Gait assistance: Min assist Gait Distance (Feet): 3 Feet Assistive device: Rolling walker (2 wheels) Gait Pattern/deviations: Step-through pattern, Decreased stride length       General Gait Details: Pt walked 3 feet forwards then backwards. Then he took several side steps along the beside. Used RW for safety. Cues for safety, directions. Pt tolerated well.  Stairs            Wheelchair Mobility    Modified Rankin (Stroke Patients Only)       Balance Overall balance assessment: Needs assistance         Standing balance support: Bilateral upper extremity supported, Reliant on assistive device for balance, During functional activity Standing balance-Leahy Scale: Poor                               Pertinent Vitals/Pain Pain Assessment Pain Assessment: Faces Faces Pain Scale: No hurt    Home Living Family/patient expects to be discharged to:: Unsure Living Arrangements: Alone Available Help at Discharge: Family;Available PRN/intermittently  Prior Function               Mobility Comments: HOH and dementia. he reports he did not use a walker/cane at baseline (unsure of accuracy)       Hand Dominance        Extremity/Trunk Assessment   Upper Extremity Assessment Upper Extremity Assessment: Generalized weakness    Lower Extremity  Assessment Lower Extremity Assessment: Generalized weakness    Cervical / Trunk Assessment Cervical / Trunk Assessment: Normal  Communication   Communication: HOH  Cognition Arousal/Alertness: Awake/alert Behavior During Therapy: WFL for tasks assessed/performed Overall Cognitive Status: History of cognitive impairments - at baseline                                 General Comments: followed 1 step commands with time        General Comments      Exercises     Assessment/Plan    PT Assessment Patient needs continued PT services  PT Problem List Decreased strength;Decreased activity tolerance;Decreased balance;Decreased mobility;Decreased knowledge of use of DME       PT Treatment Interventions DME instruction;Gait training;Therapeutic exercise;Balance training;Functional mobility training    PT Goals (Current goals can be found in the Care Plan section)  Acute Rehab PT Goals Patient Stated Goal: none stated PT Goal Formulation: Patient unable to participate in goal setting Time For Goal Achievement: 04/30/22 Potential to Achieve Goals: Good    Frequency Min 3X/week     Co-evaluation               AM-PAC PT "6 Clicks" Mobility  Outcome Measure Help needed turning from your back to your side while in a flat bed without using bedrails?: A Little Help needed moving from lying on your back to sitting on the side of a flat bed without using bedrails?: A Little Help needed moving to and from a bed to a chair (including a wheelchair)?: A Little Help needed standing up from a chair using your arms (e.g., wheelchair or bedside chair)?: A Little Help needed to walk in hospital room?: A Little Help needed climbing 3-5 steps with a railing? : A Lot 6 Click Score: 17    End of Session Equipment Utilized During Treatment: Gait belt Activity Tolerance: Patient tolerated treatment well Patient left: with call bell/phone within reach;with bed alarm set    PT Visit Diagnosis: History of falling (Z91.81);Muscle weakness (generalized) (M62.81);Difficulty in walking, not elsewhere classified (R26.2)    Time: 8182-9937 PT Time Calculation (min) (ACUTE ONLY): 14 min   Charges:   PT Evaluation $PT Eval Moderate Complexity: [place], PT Acute Rehabilitation  Office: [phone]

## 2022-04-17 DIAGNOSIS — G9341 Metabolic encephalopathy: Secondary | ICD-10-CM | POA: Diagnosis not present

## 2022-04-17 DIAGNOSIS — E871 Hypo-osmolality and hyponatremia: Secondary | ICD-10-CM | POA: Diagnosis not present

## 2022-04-17 DIAGNOSIS — N19 Unspecified kidney failure: Secondary | ICD-10-CM | POA: Diagnosis not present

## 2022-04-17 DIAGNOSIS — D638 Anemia in other chronic diseases classified elsewhere: Secondary | ICD-10-CM | POA: Diagnosis not present

## 2022-04-17 LAB — GLUCOSE, CAPILLARY
Glucose-Capillary: 142 mg/dL — ABNORMAL HIGH (ref 70–99)
Glucose-Capillary: 177 mg/dL — ABNORMAL HIGH (ref 70–99)
Glucose-Capillary: 139 mg/dL — ABNORMAL HIGH (ref 70–99)
Glucose-Capillary: 149 mg/dL — ABNORMAL HIGH (ref 70–99)

## 2022-04-17 LAB — BASIC METABOLIC PANEL
Anion gap: 5 (ref 5–15)
BUN: 9 mg/dL (ref 8–23)
CO2: 25 mmol/L (ref 22–32)
Calcium: 8.5 mg/dL — ABNORMAL LOW (ref 8.9–10.3)
Chloride: 93 mmol/L — ABNORMAL LOW (ref 98–111)
Creatinine, Ser: 0.68 mg/dL (ref 0.61–1.24)
GFR, Estimated: >60 mL/min (ref >60)
Glucose, Bld: 128 mg/dL — ABNORMAL HIGH (ref 70–99)
Potassium: 4.5 mmol/L (ref 3.5–5.1)
Sodium: 123 mmol/L — ABNORMAL LOW (ref 135–145)

## 2022-04-17 LAB — CBC
HCT: 37.1 % — ABNORMAL LOW (ref 39.0–52.0)
Hemoglobin: 13.2 g/dL (ref 13.0–17.0)
MCH: 32.4 pg (ref 26.0–34.0)
MCHC: 35.6 g/dL (ref 30.0–36.0)
MCV: 90.9 fL (ref 80.0–100.0)
Platelets: 259 10*3/uL (ref 150–400)
RBC: 4.08 MIL/uL — ABNORMAL LOW (ref 4.22–5.81)
RDW: 12.3 % (ref 11.5–15.5)
WBC: 8.3 10*3/uL (ref 4.0–10.5)
nRBC: 0 % (ref 0.0–0.2)

## 2022-04-17 LAB — MAGNESIUM: Magnesium: 2.4 mg/dL (ref 1.7–2.4)

## 2022-04-17 MED ORDER — ORAL CARE MOUTH RINSE: 15.0000 mL | OROMUCOSAL | Status: DC | PRN

## 2022-04-17 NOTE — Progress Notes (Signed)
Mobility Specialist - Progress Note   04/17/22 1556  Mobility  Activity Ambulated with assistance in room  Level of Assistance Minimal assist, patient does 75% or more  Assistive Device Front wheel walker  Distance Ambulated (ft) 20 ft  Range of Motion/Exercises Active  Activity Response Tolerated well  Mobility Referral Yes  $Mobility charge 1 Mobility   Pt was found on recliner chair and agreeable to ambulate. Needed assistance in putting his socks and shoes on for ambulation. Was min-A going from sitting to standing. Needed cues for ambulating due to hitting anything in his path and placement of his feet while using the RW. At [facility] was left on recliner chair with necessities in reach and chair alarm on. RN notified.  Ferd Hibbs Mobility Specialist

## 2022-04-17 NOTE — Care Management Important Message (Signed)
Important Message  Patient Details IM Letter given. Name: Nathan Howell MRN: [id number] Date of Birth: 04-28-1934   Medicare Important Message Given:  Yes     Kerin Salen 04/17/2022, 12:40 PM

## 2022-04-17 NOTE — TOC Initial Note (Signed)
Transition of Care [facility]) - Initial/Assessment Note    Patient Details  Name: Nathan Howell MRN: [id number] Date of Birth: 1934-06-06  Transition of Care [facility]) CM/SW Contact:    Leeroy Cha, RN Phone Number: 04/17/2022, 9:22 AM  Clinical Narrative:                 P.t. suggesting snf stay for rehab.  Will start process with fl2.  Expected Discharge Plan: Skilled Nursing Facility Barriers to Discharge: Continued Medical Work up   Patient Goals and CMS Choice Patient states their goals for this hospitalization and ongoing recovery are:: to get better          Expected Discharge Plan and Services   Discharge Planning Services: CM Consult   Living arrangements for the past 2 months: Single Family Home                                      Prior Living Arrangements/Services Living arrangements for the past 2 months: Single Family Home Lives with:: Spouse Patient language and need for interpreter reviewed:: Yes              Criminal Activity/Legal Involvement Pertinent to Current Situation/Hospitalization: No - Comment as needed  Activities of Daily Living Home Assistive Devices/Equipment: None ADL Screening (condition at time of admission) Patient's cognitive ability adequate to safely complete daily activities?: No Is the patient deaf or have difficulty hearing?: Yes Does the patient have difficulty seeing, even when wearing glasses/contacts?: No Does the patient have difficulty concentrating, remembering, or making decisions?: Yes Patient able to express need for assistance with ADLs?: No Does the patient have difficulty dressing or bathing?: Yes Independently performs ADLs?: No Dressing (OT): Independent Grooming: Needs assistance Is this a change from baseline?: Change from baseline, expected to last >3 days Feeding: Needs assistance Is this a change from baseline?: Change from baseline, expected to last >3 days Bathing: Needs assistance Is  this a change from baseline?: Change from baseline, expected to last >3 days Toileting: Needs assistance Is this a change from baseline?: Change from baseline, expected to last >3days In/Out Bed: Independent Walks in Home: Independent Does the patient have difficulty walking or climbing stairs?: No Weakness of Legs: None Weakness of Arms/Hands: None  Permission Sought/Granted                  Emotional Assessment Appearance:: Appears stated age Attitude/Demeanor/Rapport: Engaged Affect (typically observed): Appropriate Orientation: : Oriented to Self, Oriented to  Time, Oriented to Situation, Oriented to Place Alcohol / Substance Use: Never Used Psych Involvement: No (comment)  Admission diagnosis:  Acute hyponatremia [E87.1] Hyponatremia [E87.1] Hypoglycemia [E16.2] Injury of head, initial encounter [S09.90XA] Fall, initial encounter [W19.XXXA] Acute metabolic encephalopathy [H41.74] Patient Active Problem List   Diagnosis Date Noted   Hypokalemia 04/16/2022   Hypomagnesemia 04/16/2022   Acute prerenal azotemia 04/14/2022   Hypoglycemia 04/14/2022   Chronic diastolic CHF (congestive heart failure) ([place]) 04/14/2022   Anemia of chronic disease 11/14/4816   Acute metabolic encephalopathy 56/31/4970   S/P placement of cardiac pacemaker 01/31/19 leadless MDT 02/01/2019   Malnutrition of moderate degree 01/31/2019   Symptomatic bradycardia    Complete heart block ([place]) 01/30/2019   Acute hyponatremia 02/16/2015   Generalized weakness 02/16/2015   Headache 02/16/2015   Asthma 02/16/2015   COPD (chronic obstructive pulmonary disease) ([place]) 02/16/2015   Glaucoma 02/16/2015   Diabetes mellitus  without complication ([place])    CAD (coronary artery disease) 02/04/2013   Essential hypertension 02/04/2013   Hyperlipidemia 02/04/2013   Cough 05/06/2010   DM2 (diabetes mellitus, type 2) ([place]) 11/03/2009   WEIGHT LOSS-ABNORMAL 11/03/2009   Diarrhea 11/03/2009   ALLERGIC  RHINITIS 03/01/2009   Cough variant asthma 03/01/2009   PCP:  Burnard Bunting, MD Pharmacy:   [facility] [address] [place] [address] Phone: [phone] Fax: [phone]     Social Determinants of Health (SDOH) Social History: SDOH Screenings   Food Insecurity: No Food Insecurity (04/14/2022)  Housing: Low Risk  (04/14/2022)  Transportation Needs: No Transportation Needs (04/14/2022)  Utilities: Not At Risk (04/14/2022)  Tobacco Use: High Risk (04/13/2022)   SDOH Interventions:     Readmission Risk Interventions   No data to display

## 2022-04-17 NOTE — NC FL2 (Signed)
[place] LEVEL OF CARE FORM     IDENTIFICATION  Patient Name: Nathan Howell Birthdate: Mar 09, 1935 Sex: male Admission Date (Current Location): 04/13/2022  [facility] and [place] Number:  Herbalist and Address:  [facility],  [place], [place]      Provider Number: [id number]  Attending Physician Name and Address:  Flora Lipps, MD  Relative Name and Phone Number:       Current Level of Care: Hospital Recommended Level of Care: [place] Prior Approval Number:    Date Approved/Denied:   PASRR Number: [id number]  Discharge Plan: SNF    Current Diagnoses: Patient Active Problem List   Diagnosis Date Noted   Hypokalemia 04/16/2022   Hypomagnesemia 04/16/2022   Acute prerenal azotemia 04/14/2022   Hypoglycemia 04/14/2022   Chronic diastolic CHF (congestive heart failure) ([facility]) 04/14/2022   Anemia of chronic disease 36/64/4034   Acute metabolic encephalopathy 74/25/9563   S/P placement of cardiac pacemaker 01/31/19 leadless MDT 02/01/2019   Malnutrition of moderate degree 01/31/2019   Symptomatic bradycardia    Complete heart block ([facility]) 01/30/2019   Acute hyponatremia 02/16/2015   Generalized weakness 02/16/2015   Headache 02/16/2015   Asthma 02/16/2015   COPD (chronic obstructive pulmonary disease) ([place]) 02/16/2015   Glaucoma 02/16/2015   Diabetes mellitus without complication ([facility])    CAD (coronary artery disease) 02/04/2013   Essential hypertension 02/04/2013   Hyperlipidemia 02/04/2013   Cough 05/06/2010   DM2 (diabetes mellitus, type 2) ([place]) 11/03/2009   WEIGHT LOSS-ABNORMAL 11/03/2009   Diarrhea 11/03/2009   ALLERGIC RHINITIS 03/01/2009   Cough variant asthma 03/01/2009    Orientation RESPIRATION BLADDER Height & Weight     Self, Time, Situation, Place  Normal Continent Weight: 75.2 kg Height:  6' (182.9 cm)  BEHAVIORAL SYMPTOMS/MOOD NEUROLOGICAL BOWEL NUTRITION STATUS       Continent Diet (regular)  AMBULATORY STATUS COMMUNICATION OF NEEDS Skin   Extensive Assist Verbally Normal                       Personal Care Assistance Level of Assistance  Bathing, Feeding, Dressing Bathing Assistance: Limited assistance Feeding assistance: Limited assistance Dressing Assistance: Limited assistance     Functional Limitations Info  Sight, Hearing, Speech Sight Info: Adequate Hearing Info: Adequate Speech Info: Adequate    SPECIAL CARE FACTORS FREQUENCY  PT (By licensed PT), OT (By licensed OT)     PT Frequency: 5 x weekly OT Frequency: 5 x weekly            Contractures Contractures Info: Not present    Additional Factors Info  Code Status Code Status Info: full             Current Medications (04/17/2022):  This is the current hospital active medication list Current Facility-Administered Medications  Medication Dose Route Frequency Provider Last Rate Last Admin   acetaminophen (TYLENOL) tablet 650 mg  650 mg Oral Q6H PRN Howerter, Justin B, DO   650 mg at 04/16/22 2117   Or   acetaminophen (TYLENOL) suppository 650 mg  650 mg Rectal Q6H PRN Howerter, Justin B, DO       amLODipine (NORVASC) tablet 5 mg  5 mg Oral Daily Pokhrel, Laxman, MD   5 mg at 04/16/22 1016   aspirin EC tablet 81 mg  81 mg Oral Daily Pokhrel, Laxman, MD   81 mg at 04/16/22 1016   dextrose 50 % solution 50 mL  1 ampule Intravenous Q1H PRN Howerter, Justin B, DO   50 mL at 04/13/22 2341   donepezil (ARICEPT) tablet 10 mg  10 mg Oral QHS Pokhrel, Laxman, MD   10 mg at 04/16/22 2114   dorzolamide-timolol (COSOPT) 2-0.5 % ophthalmic solution 1 drop  1 drop Right Eye BID Pokhrel, Laxman, MD   1 drop at 04/16/22 2119   doxycycline (VIBRA-TABS) tablet 100 mg  100 mg Oral BID Pokhrel, Laxman, MD   100 mg at 04/16/22 2114   gatifloxacin (ZYMAXID) 0.5 % ophthalmic drops 1 drop  1 drop Right Eye QID Pokhrel, Laxman, MD   1 drop at 04/16/22 2119   insulin aspart (novoLOG) injection  0-5 Units  0-5 Units Subcutaneous QHS Pokhrel, Laxman, MD   2 Units at 04/15/22 2242   insulin aspart (novoLOG) injection 0-9 Units  0-9 Units Subcutaneous TID WC Pokhrel, Laxman, MD   1 Units at 04/17/22 0812   insulin glargine-yfgn (SEMGLEE) injection 19 Units  19 Units Subcutaneous Daily Pokhrel, Laxman, MD   19 Units at 04/16/22 1019   magnesium oxide (MAG-OX) tablet 400 mg  400 mg Oral BID Pokhrel, Laxman, MD   400 mg at 04/16/22 2114   melatonin tablet 3 mg  3 mg Oral QHS PRN Howerter, Justin B, DO   3 mg at 04/16/22 2117   metoprolol tartrate (LOPRESSOR) tablet 25 mg  25 mg Oral BID Howerter, Justin B, DO   25 mg at 04/16/22 2114   Oral care mouth rinse  15 mL Mouth Rinse PRN Pokhrel, Laxman, MD       polyethylene glycol (MIRALAX / GLYCOLAX) packet 17 g  17 g Oral Daily Pokhrel, Laxman, MD   17 g at 04/16/22 1758   polyvinyl alcohol (LIQUIFILM TEARS) 1.4 % ophthalmic solution 1 drop  1 drop Both Eyes TID PRN Pokhrel, Laxman, MD       senna-docusate (Senokot-S) tablet 1 tablet  1 tablet Oral BID Pokhrel, Laxman, MD   1 tablet at 04/16/22 2114     Discharge Medications: Please see discharge summary for a list of discharge medications.  Relevant Imaging Results:  Relevant Lab Results:   Additional Information CMK:[id number]  Leeroy Cha, RN

## 2022-04-17 NOTE — Progress Notes (Signed)
PROGRESS NOTE    Nathan Howell  STM:[id number] DOB: [birth year] DOA: 04/13/2022 PCP: Burnard Bunting, MD    Brief Narrative:   Nathan Howell is a 88 y.o. male with past medical history significant for chronic hyponatremia with baseline serum sodium level of 892-119, chronic diastolic heart failure, complete heart block status post pacemaker placement in October 2020, essential pretension, hyperlipidemia, CAD status post CABG, type 2 diabetes mellitus, anemia of chronic disease presented to the hospital with altered mental status, confusion over the last 3 to 4 days with recent fall at home without any focal weakness.  Patient does have history of chronic hyponatremia ranging between 124-130 and was on HCTZ as outpatient for chronic diastolic heart failure.  At [facility], patient was noted to be afebrile.  Labs showed sodium of 118 with potassium of 3.6 and creatinine of 0.8.  Glucose was low at 47 so received IV glucose.  EKG did not show any acute ischemic changes. Noncontrast CT head, cervical spine without any acute findings.  Patient was then admitted hospital for further evaluation and treatment.  Assessment and plan.   Acute metabolic encephalopathy:  Mild somnolence today.  Was little more alert yesterday.  Will try to avoid sedative hypnotics Haldol.  Urinalysis without infection.  Was initially volume depleted and received IV fluids.    Acute on chronic hypo-osmolar hyponatremia:  Secondary to hypovolemia and use of HCTZ.  Consider discontinuation of HCTZ on discharge.  Received IV fluids with normal saline with some improvement.  TSH within normal limits.  BNP at 55.  Will discontinue normal saline.  Latest sodium of 123.  Recent fall.  Continue fall precautions.  Physical therapy has recommended skilled nursing facility placement at this time.  Hypomagnesemia.  Improved after replacement.  Latest magnesium of 2.4.  Hypokalemia.  Improved.  Latest potassium of  4.5.   Hypoglycemia  Latest hemoglobin A1c of 6.2 in November 2016.  Continue to hold glipizide and metformin.  Restarted basal insulin, add SSI. POC glucose was 177.    acute prerenal azotemia: Likely secondary to volume depletion.  Improved    Essential Hypertension: Hold HCTZ.  Continue beta-blocker amlodipine.  Continue to hold losartan and potassium sparing diuretic.  Latest blood pressure was 123/65   Hyperlipidemia:  On Crestor at home.  Currently on hold.   Chronic diastolic heart failure:  2D echocardiogram from October 2020 showed grade 1 diastolic dysfunction.  Continue intake and output charting Daily weights.      Anemia of chronic disease:  Latest hemoglobin of 13.2.  History of corneal abrasion/infection on eyedrops.  Weakness, debility, falls.  PT evaluation recommends skilled nursing facility.  TOC has been consulted.  Patient was very independent prior to this episode as per the patient's daughter at bedside..    DVT prophylaxis: SCDs Start: 04/13/22 2322   Code Status:     Code Status: Full Code  Disposition: Skilled nursing facility as per PT recommendation.  Family wishes skilled nursing facility as well.  Status is: Inpatient  Remains inpatient appropriate because: Hyponatremia,  need for rehabitation.   Family Communication:  Spoke with the patient's daughter at bedside at 04/17/2022  Consultants:  None  Procedures:  None  Antimicrobials:  None  Anti-infectives (From admission, onward)    Start     Dose/Rate Route Frequency Ordered Stop   04/15/22 2200  doxycycline (VIBRA-TABS) tablet 100 mg        100 mg Oral 2 times daily 04/15/22 1532  Subjective: Today, seen and examined at bedside.  Denies any pain, nausea, vomiting, fever, chills or rigor.  Daughter at bedside.  Appears to mildly somnolent.  Patient daughter states that he is very sensitive to any medications.  Objective: Vitals:   04/16/22 2112 04/17/22 0500 04/17/22 0550  04/17/22 1138  BP: (!) 146/63  (!) 169/74 123/65  Pulse: 74  63 67  Resp: '18  16 14  '$ Temp: 98.4 F (36.9 C)  97.6 F (36.4 C) 98.3 F (36.8 C)  TempSrc: Oral  Oral Oral  SpO2: 100%  99% 97%  Weight:  75.2 kg    Height:        Intake/Output Summary (Last 24 hours) at 04/17/2022 1146 Last data filed at 04/17/2022 0900 Gross per 24 hour  Intake 1493.39 ml  Output 1850 ml  Net -356.61 ml    Filed Weights   04/13/22 1408 04/16/22 0500 04/17/22 0500  Weight: 71.7 kg 69.7 kg 75.2 kg    Physical Examination: Body mass index is 22.48 kg/m.   General: Patient is mildly somnolent but alert awake and Communicative answering appropriately when asked, elderly male, average built. HENT:   No scleral pallor or icterus noted. Oral mucosa is moist.   Chest: Decreased breath sounds bilaterally, no wheezes or crackles.   CVS: S1 &S2 heard. No murmur.  Regular rate and rhythm. Abdomen: Soft, nontender, nondistended.  Bowel sounds are heard.   Extremities: No cyanosis, clubbing or edema.  Peripheral pulses are palpable. Psych: Mildly somnolent but easily awakens up and is Communicative answering appropriately. CNS: Moves all extremities. Skin: Warm and dry.  No rashes noted.  Data Reviewed:   CBC: Recent Labs  Lab 04/13/22 1446 04/14/22 0435 04/15/22 0536 04/16/22 0437 04/17/22 0439  WBC 8.3 8.9 6.4 6.3 8.3  NEUTROABS 5.5 5.6  --   --   --   HGB 12.7* 13.9 13.4 12.8* 13.2  HCT 34.4* 38.6* 37.2* 36.2* 37.1*  MCV 87.8 88.3 88.4 92.8 90.9  PLT 292 283 231 239 259     Basic Metabolic Panel: Recent Labs  Lab 04/14/22 0435 04/14/22 1046 04/14/22 1541 04/15/22 0536 04/15/22 1310 04/16/22 0437 04/17/22 0439  NA 121*   < > 124* 124* 123* 122* 123*  K 3.8   < > 3.8 3.3* 3.4* 3.2* 4.5  CL 88*   < > 95* 92* 92* 95* 93*  CO2 21*   < > 20* 23 20* 20* 25  GLUCOSE 82   < > 240* 198* 235* 151* 128*  BUN 19   < > '15 11 10 9 9  '$ CREATININE 0.86   < > 0.81 0.69 0.70 0.57* 0.68   CALCIUM 9.2   < > 7.7* 8.7* 8.3* 8.0* 8.5*  MG 1.7  --   --  1.7  --  1.4* 2.4   < > = values in this interval not displayed.     Liver Function Tests: Recent Labs  Lab 04/14/22 0435  AST 30  ALT 17  ALKPHOS 43  BILITOT 0.8  PROT 6.9  ALBUMIN 4.4      Radiology Studies: No results found.    LOS: 4 days    Flora Lipps, MD [facility] Available via Epic secure chat 7am-7pm After these hours, please refer to coverage provider listed on [email or web address] 04/17/2022, 11:46 AM

## 2022-04-17 NOTE — TOC Progression Note (Signed)
Transition of Care [facility]) - Progression Note    Patient Details  Name: Nathan Howell MRN: [id number] Date of Birth: [birth year]  [facility] CM/SW Contact  Leeroy Cha, RN Phone Number: 04/17/2022, 9:37 AM  Clinical Narrative:    Fl2 faxed out to surround areas including [place] and [place].   Expected Discharge Plan: [place] Barriers to Discharge: Continued Medical Work up  Expected Discharge Plan and Services   Discharge Planning Services: CM Consult   Living arrangements for the past 2 months: Single Family Home                                       Social Determinants of Health (SDOH) Interventions SDOH Screenings   Food Insecurity: No Food Insecurity (04/14/2022)  Housing: Low Risk  (04/14/2022)  Transportation Needs: No Transportation Needs (04/14/2022)  Utilities: Not At Risk (04/14/2022)  Tobacco Use: High Risk (04/13/2022)    Readmission Risk Interventions   No data to display

## 2022-04-17 NOTE — Evaluation (Signed)
Occupational Therapy Evaluation Patient Details Name: Nathan Howell MRN: [id number] DOB: 1934-07-11 Today's Date: 04/17/2022   History of Present Illness patient is a 88 year old male who presented after a fall at home. patient admitted with acute met encephalopathy. Hx of dementia, DM, COPD, asthma, CAD, CABG, chronic diarrhea, pacemaker, anemia, CHF   Clinical Impression   Patient is a 88 year old male who was admitted for above. Patient was living at home independently with family support prior level. Currently, patient is min A for transfers and bed mobility with RW, max A for LB dressing/bathing tasks with increased lethargy. Patient was noted to have decreased functional activity tolerance, decreased endurance, decreased standing balance, decreased safety awareness, and decreased knowledge of AD/AE impacting participation in ADLs. Patient would continue to benefit from skilled OT services at this time while admitted and after d/c to address noted deficits in order to improve overall safety and independence in ADLs.       Recommendations for follow up therapy are one component of a multi-disciplinary discharge planning process, led by the attending physician.  Recommendations may be updated based on patient status, additional functional criteria and insurance authorization.   Follow Up Recommendations  Skilled nursing-short term rehab (<3 hours/day)     Assistance Recommended at Discharge Frequent or constant Supervision/Assistance  Patient can return home with the following A lot of help with walking and/or transfers;A lot of help with bathing/dressing/bathroom;Assistance with cooking/housework;Direct supervision/assist for medications management;Assist for transportation;Direct supervision/assist for financial management;Help with stairs or ramp for entrance    Functional Status Assessment  Patient has had a recent decline in their functional status and demonstrates the ability to  make significant improvements in function in a reasonable and predictable amount of time.  Equipment Recommendations  Other (comment) (defer to next venue)    Recommendations for Other Services       Precautions / Restrictions Precautions Precautions: Fall Restrictions Weight Bearing Restrictions: No      Mobility Bed Mobility Overal bed mobility: Needs Assistance Bed Mobility: Supine to Sit, Sit to Supine     Supine to sit: HOB elevated, Min guard     General bed mobility comments: with increased time and multimodal cues provided.       Balance Overall balance assessment: Needs assistance Sitting-balance support: Bilateral upper extremity supported, Feet supported Sitting balance-Leahy Scale: Fair     Standing balance support: Bilateral upper extremity supported, Reliant on assistive device for balance, During functional activity Standing balance-Leahy Scale: Poor       ADL either performed or assessed with clinical judgement   ADL Overall ADL's : Needs assistance/impaired Eating/Feeding: Minimal assistance;Sitting   Grooming: Minimal assistance;Sitting   Upper Body Bathing: Moderate assistance;Sitting   Lower Body Bathing: Total assistance;Sitting/lateral leans   Upper Body Dressing : Moderate assistance;Sitting   Lower Body Dressing: Sitting/lateral leans;Total assistance   Toilet Transfer: Minimal assistance;Ambulation;Rolling walker (2 wheels) Toilet Transfer Details (indicate cue type and reason): to transfer to recliner in room and take a few steps on side of bed. Toileting- Clothing Manipulation and Hygiene: Total assistance;Sit to/from stand       Functional mobility during ADLs: Minimal assistance;Rolling walker (2 wheels)       Vision Baseline Vision/History: 1 Wears glasses              Pertinent Vitals/Pain Pain Assessment Pain Assessment: Faces Faces Pain Scale: No hurt     Hand Dominance Right   Extremity/Trunk Assessment  Upper Extremity Assessment Upper  Extremity Assessment: Generalized weakness   Lower Extremity Assessment Lower Extremity Assessment: Defer to PT evaluation   Cervical / Trunk Assessment Cervical / Trunk Assessment: Normal   Communication Communication Communication: HOH (has hearing aids)   Cognition Arousal/Alertness: Lethargic Behavior During Therapy: WFL for tasks assessed/performed Overall Cognitive Status: Difficult to assess         General Comments: patient was leghargic able to awaken enough to participate in session. follows one step commands with increased time. patient was able to answer simple questions with increased time as well.                Home Living Family/patient expects to be discharged to:: Skilled nursing facility Living Arrangements: Alone Available Help at Discharge: Family;Available PRN/intermittently            Prior Functioning/Environment Prior Level of Function : Independent/Modified Independent               ADLs Comments: patients daughter present reporting patient was living at home alone prior level with independence in ADLs. patietn did not use any AD/AE. patients neighbors brign out the garbage bins for patient typically.        OT Problem List: Decreased activity tolerance;Impaired balance (sitting and/or standing);Decreased coordination;Decreased safety awareness;Decreased knowledge of precautions;Decreased knowledge of use of DME or AE;Cardiopulmonary status limiting activity      OT Treatment/Interventions: Self-care/ADL training;Energy conservation;Therapeutic exercise;DME and/or AE instruction;Therapeutic activities;Patient/family education;Balance training    OT Goals(Current goals can be found in the care plan section) Acute Rehab OT Goals Patient Stated Goal: none stated OT Goal Formulation: With patient/family Time For Goal Achievement: 05/01/22 Potential to Achieve Goals: Fair  OT Frequency: Min 2X/week        AM-PAC OT "6 Clicks" Daily Activity     Outcome Measure Help from another person eating meals?: A Little Help from another person taking care of personal grooming?: A Little Help from another person toileting, which includes using toliet, bedpan, or urinal?: A Lot Help from another person bathing (including washing, rinsing, drying)?: A Lot Help from another person to put on and taking off regular upper body clothing?: A Lot Help from another person to put on and taking off regular lower body clothing?: A Lot 6 Click Score: 14   End of Session Equipment Utilized During Treatment: Gait belt;Rolling walker (2 wheels) Nurse Communication: Mobility status  Activity Tolerance: Patient tolerated treatment well Patient left: in chair;with call bell/phone within reach;with chair alarm set;with family/visitor present  OT Visit Diagnosis: Unsteadiness on feet (R26.81);Other abnormalities of gait and mobility (R26.89);Muscle weakness (generalized) (M62.81)                Time: 2876-8115 OT Time Calculation (min): 28 min Charges:  OT General Charges $OT Visit: 1 Visit OT Evaluation $OT Eval Moderate Complexity: 1 Mod OT Treatments $Self Care/Home Management : 8-22 mins  Rennie Plowman, MS Acute Rehabilitation Department Office# [phone]   Willa Rough 04/17/2022, 12:43 PM

## 2022-04-17 NOTE — Plan of Care (Signed)

## 2022-04-18 DIAGNOSIS — E871 Hypo-osmolality and hyponatremia: Secondary | ICD-10-CM | POA: Diagnosis not present

## 2022-04-18 DIAGNOSIS — N19 Unspecified kidney failure: Secondary | ICD-10-CM | POA: Diagnosis not present

## 2022-04-18 DIAGNOSIS — D638 Anemia in other chronic diseases classified elsewhere: Secondary | ICD-10-CM | POA: Diagnosis not present

## 2022-04-18 DIAGNOSIS — G9341 Metabolic encephalopathy: Secondary | ICD-10-CM | POA: Diagnosis not present

## 2022-04-18 LAB — CBC
HCT: 37.1 % — ABNORMAL LOW (ref 39.0–52.0)
Hemoglobin: 12.8 g/dL — ABNORMAL LOW (ref 13.0–17.0)
MCH: 31.9 pg (ref 26.0–34.0)
MCHC: 34.5 g/dL (ref 30.0–36.0)
MCV: 92.5 fL (ref 80.0–100.0)
Platelets: 243 10*3/uL (ref 150–400)
RBC: 4.01 MIL/uL — ABNORMAL LOW (ref 4.22–5.81)
RDW: 12.6 % (ref 11.5–15.5)
WBC: 6.9 10*3/uL (ref 4.0–10.5)
nRBC: 0 % (ref 0.0–0.2)

## 2022-04-18 LAB — BASIC METABOLIC PANEL
Anion gap: 8 (ref 5–15)
BUN: 14 mg/dL (ref 8–23)
CO2: 21 mmol/L — ABNORMAL LOW (ref 22–32)
Calcium: 8.4 mg/dL — ABNORMAL LOW (ref 8.9–10.3)
Chloride: 94 mmol/L — ABNORMAL LOW (ref 98–111)
Creatinine, Ser: 0.72 mg/dL (ref 0.61–1.24)
GFR, Estimated: >60 mL/min (ref >60)
Glucose, Bld: 117 mg/dL — ABNORMAL HIGH (ref 70–99)
Potassium: 3.8 mmol/L (ref 3.5–5.1)
Sodium: 123 mmol/L — ABNORMAL LOW (ref 135–145)

## 2022-04-18 LAB — MAGNESIUM: Magnesium: 2 mg/dL (ref 1.7–2.4)

## 2022-04-18 LAB — GLUCOSE, CAPILLARY
Glucose-Capillary: 109 mg/dL — ABNORMAL HIGH (ref 70–99)
Glucose-Capillary: 230 mg/dL — ABNORMAL HIGH (ref 70–99)
Glucose-Capillary: 180 mg/dL — ABNORMAL HIGH (ref 70–99)
Glucose-Capillary: 174 mg/dL — ABNORMAL HIGH (ref 70–99)

## 2022-04-18 MED ORDER — DOXYCYCLINE HYCLATE 100 MG PO TABS
100.0000 mg | ORAL_TABLET | Freq: Two times a day (BID) | ORAL | Status: DC
  Administered 2022-04-18 – 2022-04-25 (×14): 100 mg via ORAL
  Filled 2022-04-18 (×14): qty 1

## 2022-04-18 MED ORDER — ROSUVASTATIN CALCIUM 20 MG PO TABS
20.0000 mg | ORAL_TABLET | Freq: Every day | ORAL | Status: DC
  Administered 2022-04-19 – 2022-04-25 (×7): 20 mg via ORAL
  Filled 2022-04-18 (×7): qty 1

## 2022-04-18 MED ORDER — INSULIN GLARGINE-YFGN 100 UNIT/ML ~~LOC~~ SOLN
20.0000 [IU] | Freq: Every day | SUBCUTANEOUS | Status: DC
  Administered 2022-04-19 – 2022-04-21 (×3): 20 [IU] via SUBCUTANEOUS
  Filled 2022-04-18 (×5): qty 0.2

## 2022-04-18 NOTE — Progress Notes (Signed)
Physical Therapy Treatment Patient Details Name: Nathan Howell MRN: [id number] DOB: 1934-04-07 Today's Date: 04/18/2022   History of Present Illness patient is a 87 year old male who presented after a fall at home. patient admitted with acute met encephalopathy. Hx of dementia, DM, COPD, asthma, CAD, CABG, chronic diarrhea, pacemaker, anemia, CHF    PT Comments    General Comments: difficult to arouse and maintain.  Increased alertness during session.  Pt AxO x 1 and required orientation to current place.  Pt stated his Spouse Izora Gala passed away and that he lives alone. Visibly weak, under weight. Assisted OOB was difficult due to lethargy.  Pt in bed with untouched lunch tray at bedside.  Required + 2 assist.  General transfer comment: First attempt required Mod Assist + 2 due to difficult arousal and posterior lean.  Second attempt required less assist but still + 2 assist.  Unsteady.  75% VC's on proper hand placement. General Gait Details: Required Mod/Max Asisst + 2 a limited distance of 18 feet (9 feet x 2) with recliner following behind.  Short shuffled steps and severe posterior lean.  Unsteady.  Poor self correction.  HIGH FALL RISK. Increased alertness with increased activity.  Pleasant.  Following all directions. Returned to room in recliner and set up his lunch tray.  Pt able to feed self once upright.  Does have low vision Glaucoma.   Pt will need ST Rehab at SNF to address mobility and functional decline prior to safely returning home.   Recommendations for follow up therapy are one component of a multi-disciplinary discharge planning process, led by the attending physician.  Recommendations may be updated based on patient status, additional functional criteria and insurance authorization.  Follow Up Recommendations  Skilled nursing-short term rehab (<3 hours/day) Can patient physically be transported by private vehicle: Yes   Assistance Recommended at Discharge Frequent or  constant Supervision/Assistance  Patient can return home with the following A little help with walking and/or transfers;A little help with bathing/dressing/bathroom;Assistance with cooking/housework;Assist for transportation;Help with stairs or ramp for entrance   Equipment Recommendations       Recommendations for Other Services       Precautions / Restrictions Precautions Precautions: Fall Restrictions Weight Bearing Restrictions: No     Mobility  Bed Mobility Overal bed mobility: Needs Assistance Bed Mobility: Supine to Sit     Supine to sit: Max assist     General bed mobility comments: required increased assist this session due to initial lethargy    Transfers Overall transfer level: Needs assistance Equipment used: Rolling walker (2 wheels) Transfers: Sit to/from Stand Sit to Stand: Mod assist, +2 physical assistance, +2 safety/equipment           General transfer comment: First attempt required Mod Assist + 2 due to difficult arousal and posterior lean.  Second attempt required less assist but still + 2 assist.  Unsteady.  75% VC's on proper hand placement.    Ambulation/Gait Ambulation/Gait assistance: Mod assist, +2 physical assistance, +2 safety/equipment, Max assist Gait Distance (Feet): 18 Feet Assistive device: Rolling walker (2 wheels) Gait Pattern/deviations: Step-through pattern, Decreased stride length Gait velocity: decreased     General Gait Details: Required Mod/Max Asisst + 2 a limited distance of 18 feet (9 feet x 2) with recliner following behind.  Short shuffled steps and severe posterior lean.  Unsteady.  Poor self correction.  HIGH FALL RISK.   Stairs  Wheelchair Mobility    Modified Rankin (Stroke Patients Only)       Balance                                            Cognition Arousal/Alertness: Awake/alert, Lethargic Behavior During Therapy: Flat affect                                    General Comments: difficult to arouse and maintain.  Increased alertness during session.  Pt AxO x 1 and required orientation to current place.  Pt stated his Spouse Izora Gala passed away and that he lives alone.        Exercises      General Comments        Pertinent Vitals/Pain Pain Assessment Pain Assessment: No/denies pain    Home Living                          Prior Function            PT Goals (current goals can now be found in the care plan section) Progress towards PT goals: Progressing toward goals    Frequency    Min 3X/week      PT Plan Current plan remains appropriate    Co-evaluation              AM-PAC PT "6 Clicks" Mobility   Outcome Measure  Help needed turning from your back to your side while in a flat bed without using bedrails?: A Lot Help needed moving from lying on your back to sitting on the side of a flat bed without using bedrails?: A Lot Help needed moving to and from a bed to a chair (including a wheelchair)?: A Lot Help needed standing up from a chair using your arms (e.g., wheelchair or bedside chair)?: A Lot Help needed to walk in hospital room?: A Lot Help needed climbing 3-5 steps with a railing? : A Lot 6 Click Score: 12    End of Session Equipment Utilized During Treatment: Gait belt Activity Tolerance: Patient tolerated treatment well Patient left: in chair;with call bell/phone within reach;with chair alarm set Nurse Communication: Mobility status PT Visit Diagnosis: History of falling (Z91.81);Muscle weakness (generalized) (M62.81);Difficulty in walking, not elsewhere classified (R26.2)     Time: 2641-5830 PT Time Calculation (min) (ACUTE ONLY): 28 min  Charges:  $Gait Training: 8-22 mins $Therapeutic Activity: 8-22 mins                     Rica Koyanagi  PTA Acute  Rehabilitation Services Office M-F          [phone] Weekend pager [phone]

## 2022-04-18 NOTE — Progress Notes (Addendum)
PROGRESS NOTE    Nathan Howell  AST:[id number] DOB: [birth year] DOA: 04/13/2022 PCP: Burnard Bunting, MD    Brief Narrative:   Nathan Howell is a 88 y.o. male with past medical history significant for chronic hyponatremia with baseline serum sodium level of 989-211, chronic diastolic heart failure, complete heart block status post pacemaker placement in October 2020, essential pretension, hyperlipidemia, CAD status post CABG, type 2 diabetes mellitus, anemia of chronic disease presented to the hospital with altered mental status, confusion over the last 3 to 4 days with recent fall at home without any focal weakness.  Patient does have history of chronic hyponatremia ranging between 124-130 and was on HCTZ as outpatient for chronic diastolic heart failure.  At [facility], patient was noted to be afebrile.  Labs showed sodium of 118 with potassium of 3.6 and creatinine of 0.8.  Glucose was low at 47 so received IV glucose.  EKG did not show any acute ischemic changes. Noncontrast CT head, cervical spine without any acute findings.  Patient was then admitted hospital for further evaluation and treatment.  During hospitalization, patient was closely monitored with sodium levels.  Received IV fluids.  Currently sodium levels within his base range.  PT has recommended skilled nursing facility placement.  Awaiting for placement at this time.  Assessment and plan.   Acute metabolic encephalopathy:  Proved at this time.  Initially thought to be secondary to volume depletion, hyponatremia, hypoglycemia.  Sodium level his baseline at this time.  Blood glucose levels stable.  Family states that he is very sensitive to medication including sedative-hypnotics.  Will try to avoid. Urinalysis without infection.  Was initially volume depleted and received IV fluids.  Increase oral intake at this time.  Acute on chronic hypo-osmolar hyponatremia:  Secondary to hypovolemia and use of HCTZ.   Consider discontinuation of HCTZ on discharge.  Received IV fluids with normal saline with some improvement.  TSH within normal limits.  BNP at 55.  Off normal saline.  Latest sodium of 123.  Recent fall.  Continue fall precautions.  Physical therapy has recommended skilled nursing facility placement at this time.  Hypomagnesemia.  Improved after replacement.  Latest magnesium of 2.0.  Hypokalemia.  Improved.  Latest potassium of 3.8   Hypoglycemia  Latest hemoglobin A1c of 6.2 in November 2016.  Continue to hold glipizide and metformin. continue basal insulin,SSI.     acute prerenal azotemia: Likely secondary to volume depletion.  Improved    Essential Hypertension: Hold HCTZ.  Continue metoprolol, amlodipine.  Continue to hold losartan and triamterene HCTZ.  Latest blood pressure was 139/71   Hyperlipidemia:  On Crestor at home.  Will resume.  Chronic diastolic heart failure:  2D echocardiogram from October 2020 showed grade 1 diastolic dysfunction.  Continue intake and output charting Daily weights.  Continue amlodipine metoprolol.  Hold off with HCTZ for now.    Anemia of chronic disease:  Latest hemoglobin of 12.8.  History of right corneal abrasion/infection on eyedrops and doxycycline.  Patient was recently seen at ophthalmology clinic at [facility] on 04/07/2022 and was advised to follow-up in 1 week with eyedrops and doxycycline.  Will continue for now..  Weakness, debility, falls.  PT evaluation recommends skilled nursing facility.  TOC has been consulted.  Patient was very independent prior to this episode as per the patient's family.   DVT prophylaxis: SCDs Start: 04/13/22 2322   Code Status:   Patient's son and daughter expressed that patient  was DNR's.  Will reflect that.  Disposition:  Skilled nursing facility as per PT recommendation.  Family wishes skilled nursing facility as well.  At this time patient is medically stable for disposition.  Status is:  Inpatient  Remains inpatient appropriate because: Hyponatremia,  need for rehabitation.   Family Communication:  Spoke with the patient's son at bedside at 04/18/2022  Consultants:  None  Procedures:  None  Antimicrobials:  None  Anti-infectives (From admission, onward)    Start     Dose/Rate Route Frequency Ordered Stop   04/15/22 2200  doxycycline (VIBRA-TABS) tablet 100 mg        100 mg Oral 2 times daily 04/15/22 1532        Subjective: Today, patient was seen and examined at bedside.  Denies any nausea vomiting fever chills or rigor.  Feels okay overall.  More alert awake and interactive.    Objective: Vitals:   04/17/22 2141 04/17/22 2143 04/18/22 0557 04/18/22 1241  BP: (!) 142/73 (!) 142/73 137/74 139/71  Pulse: 73 73 73 76  Resp:   16 16  Temp:   97.6 F (36.4 C) 97.7 F (36.5 C)  TempSrc:   Oral Oral  SpO2:   96% 97%  Weight:   72.5 kg   Height:        Intake/Output Summary (Last 24 hours) at 04/18/2022 1329 Last data filed at 04/18/2022 0602 Gross per 24 hour  Intake 120 ml  Output 350 ml  Net -230 ml    Filed Weights   04/16/22 0500 04/17/22 0500 04/18/22 0557  Weight: 69.7 kg 75.2 kg 72.5 kg    Physical Examination: Body mass index is 21.68 kg/m.   General: Patient is alert awake and Communicative, elderly male, not in obvious distress, HENT:   No scleral pallor or icterus noted. Oral mucosa is moist.  Right corneal injection. Chest: Decreased breath sounds bilaterally, no wheezes or crackles.   CVS: S1 &S2 heard. No murmur.  Regular rate and rhythm. Abdomen: Soft, nontender, nondistended.  Bowel sounds are heard.   Extremities: No cyanosis, clubbing or edema.  Peripheral pulses are palpable. Psych: Alert awake and Communicative.  Answering appropriately.   CNS: Nonfocal.  Generalized weakness noted. Skin: Warm and dry.  No rashes noted.  Data Reviewed:   CBC: Recent Labs  Lab 04/13/22 1446 04/14/22 0435 04/15/22 0536  04/16/22 0437 04/17/22 0439 04/18/22 0410  WBC 8.3 8.9 6.4 6.3 8.3 6.9  NEUTROABS 5.5 5.6  --   --   --   --   HGB 12.7* 13.9 13.4 12.8* 13.2 12.8*  HCT 34.4* 38.6* 37.2* 36.2* 37.1* 37.1*  MCV 87.8 88.3 88.4 92.8 90.9 92.5  PLT 292 283 231 239 259 243     Basic Metabolic Panel: Recent Labs  Lab 04/14/22 0435 04/14/22 1046 04/15/22 0536 04/15/22 1310 04/16/22 0437 04/17/22 0439 04/18/22 0410  NA 121*   < > 124* 123* 122* 123* 123*  K 3.8   < > 3.3* 3.4* 3.2* 4.5 3.8  CL 88*   < > 92* 92* 95* 93* 94*  CO2 21*   < > 23 20* 20* 25 21*  GLUCOSE 82   < > 198* 235* 151* 128* 117*  BUN 19   < > '11 10 9 9 14  '$ CREATININE 0.86   < > 0.69 0.70 0.57* 0.68 0.72  CALCIUM 9.2   < > 8.7* 8.3* 8.0* 8.5* 8.4*  MG 1.7  --  1.7  --  1.4* 2.4 2.0   < > = values in this interval not displayed.     Liver Function Tests: Recent Labs  Lab 04/14/22 0435  AST 30  ALT 17  ALKPHOS 43  BILITOT 0.8  PROT 6.9  ALBUMIN 4.4      Radiology Studies: No results found.    LOS: 5 days    Flora Lipps, MD [facility] Available via Epic secure chat 7am-7pm After these hours, please refer to coverage provider listed on [email or web address] 04/18/2022, 1:29 PM

## 2022-04-19 DIAGNOSIS — E871 Hypo-osmolality and hyponatremia: Secondary | ICD-10-CM | POA: Diagnosis not present

## 2022-04-19 DIAGNOSIS — N19 Unspecified kidney failure: Secondary | ICD-10-CM | POA: Diagnosis not present

## 2022-04-19 DIAGNOSIS — G9341 Metabolic encephalopathy: Secondary | ICD-10-CM | POA: Diagnosis not present

## 2022-04-19 DIAGNOSIS — D638 Anemia in other chronic diseases classified elsewhere: Secondary | ICD-10-CM | POA: Diagnosis not present

## 2022-04-19 LAB — MAGNESIUM: Magnesium: 2 mg/dL (ref 1.7–2.4)

## 2022-04-19 LAB — BASIC METABOLIC PANEL
Anion gap: 10 (ref 5–15)
BUN: 16 mg/dL (ref 8–23)
CO2: 22 mmol/L (ref 22–32)
Calcium: 8.3 mg/dL — ABNORMAL LOW (ref 8.9–10.3)
Chloride: 91 mmol/L — ABNORMAL LOW (ref 98–111)
Creatinine, Ser: 0.69 mg/dL (ref 0.61–1.24)
GFR, Estimated: >60 mL/min (ref >60)
Glucose, Bld: 96 mg/dL (ref 70–99)
Potassium: 3.6 mmol/L (ref 3.5–5.1)
Sodium: 123 mmol/L — ABNORMAL LOW (ref 135–145)

## 2022-04-19 LAB — GLUCOSE, CAPILLARY
Glucose-Capillary: 102 mg/dL — ABNORMAL HIGH (ref 70–99)
Glucose-Capillary: 204 mg/dL — ABNORMAL HIGH (ref 70–99)
Glucose-Capillary: 200 mg/dL — ABNORMAL HIGH (ref 70–99)
Glucose-Capillary: 188 mg/dL — ABNORMAL HIGH (ref 70–99)

## 2022-04-19 LAB — CBC
HCT: 36.1 % — ABNORMAL LOW (ref 39.0–52.0)
Hemoglobin: 13 g/dL (ref 13.0–17.0)
MCH: 32.1 pg (ref 26.0–34.0)
MCHC: 36 g/dL (ref 30.0–36.0)
MCV: 89.1 fL (ref 80.0–100.0)
Platelets: 254 10*3/uL (ref 150–400)
RBC: 4.05 MIL/uL — ABNORMAL LOW (ref 4.22–5.81)
RDW: 12.3 % (ref 11.5–15.5)
WBC: 7.4 10*3/uL (ref 4.0–10.5)
nRBC: 0 % (ref 0.0–0.2)

## 2022-04-19 NOTE — Plan of Care (Signed)
  Problem: Education: Goal: Knowledge of General Education information will improve Description: Including pain rating scale, medication(s)/side effects and non-pharmacologic comfort measures 04/19/2022 1534 by Jerene Pitch, RN Outcome: Not Progressing 04/19/2022 1533 by Jerene Pitch, RN Outcome: Progressing   Problem: Health Behavior/Discharge Planning: Goal: Ability to manage health-related needs will improve 04/19/2022 1534 by Jerene Pitch, RN Outcome: Not Progressing 04/19/2022 1533 by Jerene Pitch, RN Outcome: Progressing

## 2022-04-19 NOTE — Progress Notes (Signed)
Occupational Therapy Treatment Patient Details Name: Nathan Howell MRN: [id number] DOB: [birth year] Today's Date: 04/19/2022   History of present illness Patient is a 88 year old male who presented after a fall at home. patient admitted with acute met encephalopathy. Hx of dementia, DM, COPD, asthma, CAD, CABG, chronic diarrhea, pacemaker, anemia, CHF   OT comments  Patient was noted to be more lethargic compared  to last OT session with more physical assistance needed to complete transfer to recliner. Patients son was present during this session with education provided to patient and son on patient starting to participate more in grooming tasks to gain independence back. Patient and son verbalized understanding. Patient's discharge plan remains appropriate at this time. OT will continue to follow acutely.     Recommendations for follow up therapy are one component of a multi-disciplinary discharge planning process, led by the attending physician.  Recommendations may be updated based on patient status, additional functional criteria and insurance authorization.    Follow Up Recommendations  Skilled nursing-short term rehab (<3 hours/day)     Assistance Recommended at Discharge Frequent or constant Supervision/Assistance  Patient can return home with the following  A lot of help with walking and/or transfers;A lot of help with bathing/dressing/bathroom;Assistance with cooking/housework;Direct supervision/assist for medications management;Assist for transportation;Direct supervision/assist for financial management;Help with stairs or ramp for entrance   Equipment Recommendations  Other (comment) (defer to next venue)    Recommendations for Other Services      Precautions / Restrictions Precautions Precautions: Fall Restrictions Weight Bearing Restrictions: No       Mobility Bed Mobility Overal bed mobility: Needs Assistance Bed Mobility: Supine to Sit     Supine to sit: Mod  assist     General bed mobility comments: with verbal cues for sequencing of task and HOB raised.       Balance Overall balance assessment: Needs assistance Sitting-balance support: Bilateral upper extremity supported, Feet supported Sitting balance-Leahy Scale: Fair     Standing balance support: Bilateral upper extremity supported, Reliant on assistive device for balance, During functional activity Standing balance-Leahy Scale: Poor Standing balance comment: replant on external force for balance         ADL either performed or assessed with clinical judgement   ADL Overall ADL's : Needs assistance/impaired         Toilet Transfer: Maximal assistance;Ambulation;Rolling walker (2 wheels) Toilet Transfer Details (indicate cue type and reason): patient was max A to transfer from edge of bed to recliner in room with RW with increased time and cues for proper foot placement. patient was educated on [place] BLE moving in chair and doing ankle pumps and BLE and BUE movements when sitting up. patient verbalized and demonstrated understanding. son in room very supportive of patient at this time.           General ADL Comments: patient and son were educated on patient participating in tasks that he could do for himself like brushing teeth and washing face and trying to get more independence in tasks. patient and son verbalized understanding.      Cognition Arousal/Alertness: Awake/alert, Lethargic Behavior During Therapy: Flat affect Overall Cognitive Status: Difficult to assess                      Pertinent Vitals/ Pain       Pain Assessment Pain Assessment: No/denies pain         Frequency  Min 2X/week  Progress Toward Goals  OT Goals(current goals can now be found in the care plan section)  Progress towards OT goals: Progressing toward goals     Plan Discharge plan remains appropriate       AM-PAC OT "6 Clicks" Daily Activity     Outcome  Measure   Help from another person eating meals?: A Little Help from another person taking care of personal grooming?: A Little Help from another person toileting, which includes using toliet, bedpan, or urinal?: A Lot Help from another person bathing (including washing, rinsing, drying)?: A Lot Help from another person to put on and taking off regular upper body clothing?: A Lot Help from another person to put on and taking off regular lower body clothing?: A Lot 6 Click Score: 14    End of Session Equipment Utilized During Treatment: Rolling walker (2 wheels)  OT Visit Diagnosis: Unsteadiness on feet (R26.81);Other abnormalities of gait and mobility (R26.89);Muscle weakness (generalized) (M62.81)   Activity Tolerance Patient tolerated treatment well   Patient Left in chair;with call bell/phone within reach;with family/visitor present   Nurse Communication Mobility status        Time: 2500-3704 OT Time Calculation (min): 14 min  Charges: OT General Charges $OT Visit: 1 Visit OT Treatments $Therapeutic Activity: 8-22 mins  Rennie Plowman, MS Acute Rehabilitation Department Office# [phone]   Willa Rough 04/19/2022, 9:52 AM

## 2022-04-19 NOTE — Progress Notes (Signed)
PROGRESS NOTE    Nathan Howell  IWP:[id number] DOB: [birth year] DOA: 04/13/2022 PCP: Burnard Bunting, MD    Brief Narrative:   Nathan Howell is a 88 y.o. male with past medical history significant for chronic hyponatremia with baseline serum sodium level of 505-397, chronic diastolic heart failure, complete heart block status post pacemaker placement in October 2020, essential pretension, hyperlipidemia, CAD status post CABG, type 2 diabetes mellitus, anemia of chronic disease presented to the hospital with altered mental status, confusion over the last 3 to 4 days with recent fall at home without any focal weakness.  Patient does have history of chronic hyponatremia ranging between 124-130 and was on HCTZ as outpatient for chronic diastolic heart failure.  At [facility], patient was noted to be afebrile.  Labs showed sodium of 118 with potassium of 3.6 and creatinine of 0.8.  Glucose was low at 47 so received IV glucose.  EKG did not show any acute ischemic changes. Noncontrast CT head, cervical spine without any acute findings.  Patient was then admitted hospital for further evaluation and treatment.  During hospitalization, patient was closely monitored with sodium levels.  Received IV fluids.  Currently sodium levels within his base range.  PT has recommended skilled nursing facility placement.  Awaiting for placement at this time.  Assessment and plan.   Acute metabolic encephalopathy:  Overall improved at this time.  Initially thought to be secondary to volume depletion, hyponatremia, hypoglycemia.  Sodium level his baseline at this time.  Blood glucose levels stable.  Family states that he is very sensitive to medication including sedative-hypnotics.  Will try to avoid. Urinalysis without infection.  Was initially volume depleted and received IV fluids.  Increase oral intake at this time.  Urine is still concentrated.  Encouraged oral fluids.  Spoke with the nursing staff and  family at bedside.  Acute on chronic hypo-osmolar hyponatremia:  Secondary to hypovolemia and use of HCTZ.  Consider discontinuation of HCTZ on discharge.  Received IV fluids with normal saline with some improvement.  TSH within normal limits.  BNP at 55.  Off normal saline.  Latest sodium of 123.  Recent fall.  Continue fall precautions.  Physical therapy has recommended skilled nursing facility placement at this time.  Hypomagnesemia.  Improved after replacement.  Latest magnesium of 2.0.  Hypokalemia.  Improved.  Latest potassium of 3.8   Hypoglycemia  Latest hemoglobin A1c of 6.2 in November 2016.  Continue to hold glipizide and metformin. continue basal insulin,SSI.  Latest POC glucose of 204.    acute prerenal azotemia: Likely secondary to volume depletion.  Improved.  Will continue encourage oral intake.   Essential Hypertension: Hold HCTZ.  Continue metoprolol, amlodipine.  Continue to hold losartan and triamterene HCTZ.  Latest blood pressure was 139/71   Hyperlipidemia:  On Crestor at home.  Will resume.  Chronic diastolic heart failure:  2D echocardiogram from October 2020 showed grade 1 diastolic dysfunction.  Continue intake and output charting, Daily weights.  Continue amlodipine, metoprolol.  Hold off with HCTZ for now.    Anemia of chronic disease:  Latest hemoglobin of 13.0.  History of right corneal abrasion/infection on eyedrops and doxycycline.  Patient was recently seen at ophthalmology clinic at [facility] on 04/07/2022 and was advised to follow-up in 1 week with eyedrops and doxycycline.  Will continue for now..  Weakness, debility, falls.  PT evaluation recommends skilled nursing facility.  TOC has been consulted.  Patient was very independent  prior to this episode as per the patient's family.   DVT prophylaxis: SCDs Start: 04/13/22 2322   Code Status:   DNR.  Disposition:  Awaiting for skilled nursing facility. At this time patient is medically  stable for disposition.  Status is: Inpatient  Remains inpatient appropriate because: Hyponatremia,  need for rehabilitation.   Family Communication:  Spoke with the patient's son at bedside at 04/19/2022  Consultants:  None  Procedures:  None  Antimicrobials:  None  Anti-infectives (From admission, onward)    Start     Dose/Rate Route Frequency Ordered Stop   04/18/22 1400  doxycycline (VIBRA-TABS) tablet 100 mg        100 mg Oral Every 12 hours 04/18/22 1339     04/15/22 2200  doxycycline (VIBRA-TABS) tablet 100 mg  Status:  Discontinued        100 mg Oral 2 times daily 04/15/22 1532 04/18/22 1334      Subjective: Today, patient was seen and examined at bedside.  Denies any nausea vomiting fever chills or rigor.  Has had concentrated urine and feels sleepy.  Denies any pain, nausea, vomiting, fever or chills.  Encouraged oral hydration.  Objective: Vitals:   04/18/22 2055 04/19/22 0603 04/19/22 0852 04/19/22 1134  BP: (!) 142/68 133/63 133/69 132/65  Pulse: (!) 57 82 84 66  Resp: '16 18  16  '$ Temp: 98.1 F (36.7 C) 98.1 F (36.7 C)  98.2 F (36.8 C)  TempSrc: Oral Oral  Oral  SpO2: 95% 97%  99%  Weight:  72.9 kg    Height:        Intake/Output Summary (Last 24 hours) at 04/19/2022 1339 Last data filed at 04/18/2022 2130 Gross per 24 hour  Intake 360 ml  Output 500 ml  Net -140 ml    Filed Weights   04/17/22 0500 04/18/22 0557 04/19/22 0603  Weight: 75.2 kg 72.5 kg 72.9 kg    Physical Examination: Body mass index is 21.8 kg/m.   General: Patient is alert awake and Communicative, elderly male, easily awakened but sometimes a little sleepy.   HENT:   No scleral pallor or icterus noted. Oral mucosa is dry.  Right corneal injection. Chest: Decreased breath sounds bilaterally, no wheezes or crackles.  Clear breath sounds. CVS: S1 &S2 heard. No murmur.  Regular rate and rhythm. Abdomen: Soft, nontender, nondistended.  Bowel sounds are heard.   Extremities:  No cyanosis, clubbing or edema.   Psych: Alert awake and Communicative.   CNS: Nonfocal.  Moves all extremities, generalized weakness noted. Skin: Warm and dry.  No rashes noted.  Data Reviewed:   CBC: Recent Labs  Lab 04/13/22 1446 04/14/22 0435 04/15/22 0536 04/16/22 0437 04/17/22 0439 04/18/22 0410 04/19/22 0424  WBC 8.3 8.9 6.4 6.3 8.3 6.9 7.4  NEUTROABS 5.5 5.6  --   --   --   --   --   HGB 12.7* 13.9 13.4 12.8* 13.2 12.8* 13.0  HCT 34.4* 38.6* 37.2* 36.2* 37.1* 37.1* 36.1*  MCV 87.8 88.3 88.4 92.8 90.9 92.5 89.1  PLT 292 283 231 239 259 243 254     Basic Metabolic Panel: Recent Labs  Lab 04/15/22 0536 04/15/22 1310 04/16/22 0437 04/17/22 0439 04/18/22 0410 04/19/22 0424  NA 124* 123* 122* 123* 123* 123*  K 3.3* 3.4* 3.2* 4.5 3.8 3.6  CL 92* 92* 95* 93* 94* 91*  CO2 23 20* 20* 25 21* 22  GLUCOSE 198* 235* 151* 128* 117* 96  BUN 11 10  $'9 9 14 16  'd$ CREATININE 0.69 0.70 0.57* 0.68 0.72 0.69  CALCIUM 8.7* 8.3* 8.0* 8.5* 8.4* 8.3*  MG 1.7  --  1.4* 2.4 2.0 2.0     Liver Function Tests: Recent Labs  Lab 04/14/22 0435  AST 30  ALT 17  ALKPHOS 43  BILITOT 0.8  PROT 6.9  ALBUMIN 4.4      Radiology Studies: No results found.    LOS: 6 days    Flora Lipps, MD [facility] Available via Epic secure chat 7am-7pm After these hours, please refer to coverage provider listed on [email or web address] 04/19/2022, 1:39 PM

## 2022-04-19 NOTE — TOC Progression Note (Addendum)
Transition of Care [facility]) - Progression Note    Patient Details  Name: Nathan Howell MRN: [id number] Date of Birth: 11-Jul-1934  Transition of Care [facility]) CM/SW Contact  Leeroy Cha, RN Phone Number: 04/19/2022, 9:57 AM  Clinical Narrative:    Destination  Service Provider Request Status Selected Services Address Phone Fax Patient Preferred  [facility] SN[facility]  Accepted N/A [address] [place] [address] [phone] --  [facility] Preferred SNF  Accepted N/A [address], [place] [address] [phone] --  [facility]  Accepted N/A [address] [place] [address] [phone] --  [facility]  Accepted N/A [address] [place] [address] [phone] --  [facility] Preferred SNF  Accepted N/A [address], [place] [address] [phone] --  [facility] Preferred SNF  Accepted N/A [address] [place] [address] [phone] --   List given to the son.  He will discuss with family and call back today. [facility] is choosing [facility] at the present time. [facility]ct-Debbie at [facility] will start auth and send to central intake. Tcf-daughter/family wants [email or web address] discussed with Deirdre Pippins at [email or web address] sent Expected Discharge Plan: [place] Barriers to Discharge: Continued Medical Work up  Expected Discharge Plan and Services   Discharge Planning Services: CM Consult   Living arrangements for the past 2 months: Single Family Home                                       Social Determinants of Health (SDOH) Interventions SDOH Screenings   Food Insecurity: No Food Insecurity (04/14/2022)  Housing: Low Risk  (04/14/2022)  Transportation Needs: No Transportation Needs (04/14/2022)  Utilities: Not At Risk (04/14/2022)  Tobacco Use: High  Risk (04/13/2022)    Readmission Risk Interventions   No data to display

## 2022-04-20 DIAGNOSIS — N19 Unspecified kidney failure: Secondary | ICD-10-CM | POA: Diagnosis not present

## 2022-04-20 DIAGNOSIS — D638 Anemia in other chronic diseases classified elsewhere: Secondary | ICD-10-CM | POA: Diagnosis not present

## 2022-04-20 DIAGNOSIS — G9341 Metabolic encephalopathy: Secondary | ICD-10-CM | POA: Diagnosis not present

## 2022-04-20 DIAGNOSIS — E871 Hypo-osmolality and hyponatremia: Secondary | ICD-10-CM | POA: Diagnosis not present

## 2022-04-20 LAB — BASIC METABOLIC PANEL
Anion gap: 10 (ref 5–15)
BUN: 15 mg/dL (ref 8–23)
CO2: 22 mmol/L (ref 22–32)
Calcium: 8.4 mg/dL — ABNORMAL LOW (ref 8.9–10.3)
Chloride: 92 mmol/L — ABNORMAL LOW (ref 98–111)
Creatinine, Ser: 0.63 mg/dL (ref 0.61–1.24)
GFR, Estimated: >60 mL/min (ref >60)
Glucose, Bld: 103 mg/dL — ABNORMAL HIGH (ref 70–99)
Potassium: 3.7 mmol/L (ref 3.5–5.1)
Sodium: 124 mmol/L — ABNORMAL LOW (ref 135–145)

## 2022-04-20 LAB — GLUCOSE, CAPILLARY
Glucose-Capillary: 122 mg/dL — ABNORMAL HIGH (ref 70–99)
Glucose-Capillary: 176 mg/dL — ABNORMAL HIGH (ref 70–99)
Glucose-Capillary: 210 mg/dL — ABNORMAL HIGH (ref 70–99)
Glucose-Capillary: 173 mg/dL — ABNORMAL HIGH (ref 70–99)

## 2022-04-20 NOTE — Care Management Important Message (Signed)
Important Message  Patient Details IM Letter given. Name: Nathan Howell MRN: [id number] Date of Birth: 12/28/1934   Medicare Important Message Given:  Yes     Kerin Salen 04/20/2022, 12:55 PM

## 2022-04-20 NOTE — Plan of Care (Signed)
  Problem: Education: Goal: Knowledge of General Education information will improve Description: Including pain rating scale, medication(s)/side effects and non-pharmacologic comfort measures Outcome: Progressing   Problem: Health Behavior/Discharge Planning: Goal: Ability to manage health-related needs will improve Outcome: Progressing

## 2022-04-20 NOTE — Progress Notes (Signed)
PROGRESS NOTE    Nathan Howell  AYT:[id number] DOB: 07/10/1934 DOA: 04/13/2022 PCP: Burnard Bunting, MD    Brief Narrative:   Nathan Howell is a 88 y.o. male with past medical history significant for chronic hyponatremia with baseline serum sodium level of 355-732, chronic diastolic heart failure, complete heart block status post pacemaker placement in October 2020, essential pretension, hyperlipidemia, CAD status post CABG, type 2 diabetes mellitus, anemia of chronic disease presented to the hospital with altered mental status, confusion over the last 3 to 4 days with recent fall at home without any focal weakness.  Patient does have history of chronic hyponatremia ranging between 124-130 and was on HCTZ as outpatient for chronic diastolic heart failure.  At [facility], patient was noted to be afebrile.  Labs showed sodium of 118 with potassium of 3.6 and creatinine of 0.8.  Glucose was low at 47 so received IV glucose.  EKG did not show any acute ischemic changes. Noncontrast CT head, cervical spine without any acute findings.  Patient was then admitted hospital for further evaluation and treatment.  During hospitalization, patient was closely monitored with sodium levels.  Received IV fluids.  Currently sodium levels within his base range.  PT has recommended skilled nursing facility placement.  Awaiting for placement at this time.  Assessment and plan.   Acute metabolic encephalopathy:  Overall improved at this time.  Initially thought to be secondary to volume depletion, hyponatremia, hypoglycemia.  Sodium level his baseline at this time.  Blood glucose levels stable.  Family states that he is very sensitive to medication including sedative-hypnotics.  Urinalysis without infection.  Urine was concentrated with the decreased intake so has been encouraged oral fluids.  Will try to avoid sedative-hypnotics.  Acute on chronic hypo-osmolar hyponatremia:  Secondary to hypovolemia  and use of HCTZ.  Consider discontinuation of HCTZ on discharge.  Received IV fluids with normal saline with some improvement.  TSH within normal limits.  BNP at 55.  Off normal saline at this time.  Latest sodium of 124.  Recent fall.  Continue fall precautions.  Physical therapy has recommended skilled nursing facility placement at this time.  Hypomagnesemia.  Improved after replacement.  Latest magnesium of 2.0.  Hypokalemia.  Improved.  Latest potassium of 3.7   Hypoglycemia  Latest hemoglobin A1c of 6.2 in November 2016.  Continue to hold glipizide and metformin. continue basal insulin,SSI.  Latest POC glucose of 122    acute prerenal azotemia: Likely secondary to volume depletion.  Improved.  Will continue encourage oral intake.   Essential Hypertension: Hold HCTZ.  Continue metoprolol, amlodipine.  Continue to hold losartan and triamterene HCTZ.  Latest blood pressure was 139/71   Hyperlipidemia:  On Crestor at home.  Will resume.  Chronic diastolic heart failure:  2D echocardiogram from October 2020 showed grade 1 diastolic dysfunction.  Continue intake and output charting, Daily weights.  Continue amlodipine, metoprolol.  Hold off with HCTZ for now.    Anemia of chronic disease:  Latest hemoglobin of 13.0.  History of right corneal abrasion/infection on eyedrops and doxycycline.  Patient was recently seen at ophthalmology clinic at [facility] on 04/07/2022 and was advised to follow-up in 1 week with eyedrops and doxycycline.  Will continue for now..  Weakness, debility, falls.   PT evaluation recommends skilled nursing facility.  TOC has been consulted.  Patient was very independent prior to this episode as per the patient's family.   DVT prophylaxis: SCDs Start:  04/13/22 2322   Code Status:   DNR.  Disposition:  Awaiting for skilled nursing facility. At this time, patient is medically stable for disposition.  Status is: Inpatient  Remains inpatient appropriate  because: Hyponatremia,  need for rehabilitation.   Family Communication:  Spoke with the patient's son at bedside at 04/19/2022  Consultants:  None  Procedures:  None  Antimicrobials:  None  Anti-infectives (From admission, onward)    Start     Dose/Rate Route Frequency Ordered Stop   04/18/22 1400  doxycycline (VIBRA-TABS) tablet 100 mg        100 mg Oral Every 12 hours 04/18/22 1339     04/15/22 2200  doxycycline (VIBRA-TABS) tablet 100 mg  Status:  Discontinued        100 mg Oral 2 times daily 04/15/22 1532 04/18/22 1334      Subjective:  Today, patient was seen and examined at bedside.  Denies any nausea, vomiting, fever, chills or rigor.  Denies any dizziness lightheadedness or shortness of breath.   Objective: Vitals:   04/19/22 1134 04/19/22 2114 04/20/22 0426 04/20/22 0844  BP: 132/65 125/72 (!) 166/79 (!) 149/75  Pulse: 66 85 71 75  Resp: '16 13 16   '$ Temp: 98.2 F (36.8 C) 98.8 F (37.1 C) 97.8 F (36.6 C)   TempSrc: Oral Oral Oral   SpO2: 99% 98% 94%   Weight:   72.8 kg   Height:        Intake/Output Summary (Last 24 hours) at 04/20/2022 1120 Last data filed at 04/20/2022 0700 Gross per 24 hour  Intake 480 ml  Output --  Net 480 ml    Filed Weights   04/18/22 0557 04/19/22 0603 04/20/22 0426  Weight: 72.5 kg 72.9 kg 72.8 kg    Physical Examination: Body mass index is 21.77 kg/m.   General: Patient is alert awake and Communicative.  Elderly male, not in obvious distress. HENT:   No scleral pallor or icterus noted. Oral mucosa is moist..  Right corneal injection. Chest: Decreased breath sounds bilaterally, no wheezes or crackles.  Clear breath sounds. CVS: S1 &S2 heard. No murmur.  Regular rate and rhythm. Abdomen: Soft, nontender, nondistended.  Bowel sounds are heard.   Extremities: No cyanosis, clubbing or edema.   Psych: Alert awake and communicative, CNS: Nonfocal.  Moves all extremities.  Generalized weakness noted. Skin: Warm and dry.   No rashes noted.  Data Reviewed:   CBC: Recent Labs  Lab 04/13/22 1446 04/14/22 0435 04/15/22 0536 04/16/22 0437 04/17/22 0439 04/18/22 0410 04/19/22 0424  WBC 8.3 8.9 6.4 6.3 8.3 6.9 7.4  NEUTROABS 5.5 5.6  --   --   --   --   --   HGB 12.7* 13.9 13.4 12.8* 13.2 12.8* 13.0  HCT 34.4* 38.6* 37.2* 36.2* 37.1* 37.1* 36.1*  MCV 87.8 88.3 88.4 92.8 90.9 92.5 89.1  PLT 292 283 231 239 259 243 254     Basic Metabolic Panel: Recent Labs  Lab 04/15/22 0536 04/15/22 1310 04/16/22 0437 04/17/22 0439 04/18/22 0410 04/19/22 0424 04/20/22 0415  NA 124*   < > 122* 123* 123* 123* 124*  K 3.3*   < > 3.2* 4.5 3.8 3.6 3.7  CL 92*   < > 95* 93* 94* 91* 92*  CO2 23   < > 20* 25 21* 22 22  GLUCOSE 198*   < > 151* 128* 117* 96 103*  BUN 11   < > '9 9 14 16 '$ 15  CREATININE 0.69   < > 0.57* 0.68 0.72 0.69 0.63  CALCIUM 8.7*   < > 8.0* 8.5* 8.4* 8.3* 8.4*  MG 1.7  --  1.4* 2.4 2.0 2.0  --    < > = values in this interval not displayed.     Liver Function Tests: Recent Labs  Lab 04/14/22 0435  AST 30  ALT 17  ALKPHOS 43  BILITOT 0.8  PROT 6.9  ALBUMIN 4.4      Radiology Studies: No results found.    LOS: 7 days    Flora Lipps, MD [facility] Available via Epic secure chat 7am-7pm After these hours, please refer to coverage provider listed on [email or web address] 04/20/2022, 11:20 AM

## 2022-04-20 NOTE — TOC Progression Note (Addendum)
Transition of Care [facility]) - Progression Note    Patient Details  Name: Nathan Howell MRN: [id number] Date of Birth: 1935/01/11  Transition of Care [facility]) CM/SW [place], LCSW Phone Number: 04/20/2022, 9:49 AM  Clinical Narrative:    TOC CSW spoke with pt's son, he reported the family is still deciding on what facility to choose from. CSW explained to pt's son they will have to choose a bed today as insurance auth will need to start today. [name] stated he would call this CSW back. TOC to follow.   ADDEN 2:10pm CSW reached out to pt's son regarding to SNF bed choice. No response left a Message requesting a return call. CSW spoke with pt's daughter Nathan Howell, she reported she has visited several facilities and still has 4 more to visit. CSW informed pt's daughter, pt only received 6-bed offers. and only can choose from that list as the other facilities declined. [name] has been visiting facilities that declined pt for SNF placement. CSW reminded the family of the urgency to choose a facility from the list that was provided. TOC to follow.      Expected Discharge Plan: [place] Barriers to Discharge: Continued Medical Work up  Expected Discharge Plan and Services   Discharge Planning Services: CM Consult   Living arrangements for the past 2 months: Single Family Home                                       Social Determinants of Health (SDOH) Interventions SDOH Screenings   Food Insecurity: No Food Insecurity (04/14/2022)  Housing: Low Risk  (04/14/2022)  Transportation Needs: No Transportation Needs (04/14/2022)  Utilities: Not At Risk (04/14/2022)  Tobacco Use: High Risk (04/13/2022)    Readmission Risk Interventions     No data to display

## 2022-04-21 DIAGNOSIS — G9341 Metabolic encephalopathy: Secondary | ICD-10-CM | POA: Diagnosis not present

## 2022-04-21 LAB — GLUCOSE, CAPILLARY
Glucose-Capillary: 114 mg/dL — ABNORMAL HIGH (ref 70–99)
Glucose-Capillary: 129 mg/dL — ABNORMAL HIGH (ref 70–99)
Glucose-Capillary: 152 mg/dL — ABNORMAL HIGH (ref 70–99)
Glucose-Capillary: 142 mg/dL — ABNORMAL HIGH (ref 70–99)

## 2022-04-21 MED ORDER — DICYCLOMINE HCL 10 MG PO CAPS: 10.0000 mg | ORAL_CAPSULE | Freq: Three times a day (TID) | ORAL | Status: DC | PRN

## 2022-04-21 MED ORDER — PANTOPRAZOLE SODIUM 40 MG PO TBEC: 40.0000 mg | DELAYED_RELEASE_TABLET | Freq: Every day | ORAL | Status: DC

## 2022-04-21 NOTE — Progress Notes (Signed)
PROGRESS NOTE    ASCENCION COYE  ZOX:[id number] DOB: 1934-10-09 DOA: 04/13/2022 PCP: Burnard Bunting, MD    Brief Narrative:   Nathan Howell is a 88 y.o. male with past medical history significant for chronic hyponatremia with baseline serum sodium level of 811-914, chronic diastolic heart failure, complete heart block status post pacemaker placement in October 2020, essential pretension, hyperlipidemia, CAD status post CABG, type 2 diabetes mellitus, anemia of chronic disease presented to the hospital with altered mental status, confusion over the last 3 to 4 days with recent fall at home without any focal weakness.  Patient does have history of chronic hyponatremia ranging between 124-130 and was on HCTZ as outpatient for chronic diastolic heart failure.  At [facility], patient was noted to be afebrile.  Labs showed sodium of 118 with potassium of 3.6 and creatinine of 0.8.  Glucose was low at 47 so received IV glucose.  EKG did not show any acute ischemic changes. Noncontrast CT head, cervical spine without any acute findings.  Patient was then admitted hospital for further evaluation and treatment.  During hospitalization, patient was closely monitored with sodium levels.  Received IV fluids.  Currently sodium levels within his base range.  PT has recommended skilled nursing facility placement.  Awaiting for placement at this time.  Assessment and plan.   Acute metabolic encephalopathy:  -Overall improved at this time.  Initially thought to be secondary to volume depletion, hyponatremia, hypoglycemia.  Sodium level his baseline at this time.  Blood glucose levels stable.  Family states that he is very sensitive to medication including sedative-hypnotics.  Urinalysis without infection.    Acute on chronic hypo-osmolar hyponatremia:  Secondary to hypovolemia and use of HCTZ.   - discontinuation of HCTZ on discharge.   -Received IV fluids with normal saline with some  improvement but Na stable around 124.  TSH within normal limits.  BNP at 55.   -check urine Na and fluid restrict for now  Recent fall.  Continue fall precautions.  Physical therapy has recommended skilled nursing facility placement at this time. -awaiting insurance auth  Hypomagnesemia.  Improved after replacement.  Hypokalemia.  Improved.    Hypoglycemia  Latest hemoglobin A1c of 6.2 in November 2016.  Continue to hold glipizide and metformin. continue basal insulin,SSI.  Latest POC glucose of 122    acute prerenal azotemia: Likely secondary to volume depletion.  Improved.  Will continue encourage oral intake.   Essential Hypertension: Hold HCTZ.  Continue metoprolol, amlodipine.  Continue to hold losartan and triamterene HCTZ.     Hyperlipidemia:  -continue home meds  Chronic diastolic heart failure:  2D echocardiogram from October 2020 showed grade 1 diastolic dysfunction.  Continue intake and output charting, Daily weights.  Continue amlodipine, metoprolol.  Hold off with HCTZ for now.    Anemia of chronic disease:  Latest hemoglobin of 13.0.  History of right corneal abrasion/infection on eyedrops and doxycycline.  Patient was recently seen at ophthalmology clinic at [facility] on 04/07/2022 and was advised to follow-up in 1 week with eyedrops and doxycycline.  Will continue for now..  Weakness, debility, falls.   PT evaluation recommends skilled nursing facility.  TOC has been consulted.  Patient was very independent prior to this episode as per the patient's family.   DVT prophylaxis: SCDs Start: 04/13/22 2322   Code Status:   DNR.  Disposition:  Awaiting for skilled nursing facility  Status is: Inpatient  Remains inpatient appropriate because: Hyponatremia,  need for rehabilitation.     Anti-infectives (From admission, onward)    Start     Dose/Rate Route Frequency Ordered Stop   04/18/22 1400  doxycycline (VIBRA-TABS) tablet 100 mg        100 mg Oral  Every 12 hours 04/18/22 1339     04/15/22 2200  doxycycline (VIBRA-TABS) tablet 100 mg  Status:  Discontinued        100 mg Oral 2 times daily 04/15/22 1532 04/18/22 1334      Subjective:  No complaints   Objective: Vitals:   04/20/22 0844 04/20/22 1201 04/20/22 2049 04/21/22 0532  BP: (!) 149/75 (!) 140/69 137/65 (!) 149/75  Pulse: 75 76 78 83  Resp:  '16 15 19  '$ Temp:  98.5 F (36.9 C) 99.2 F (37.3 C) 98.6 F (37 C)  TempSrc:  Oral Oral Oral  SpO2:  98% 96% 97%  Weight:    72.8 kg  Height:        Intake/Output Summary (Last 24 hours) at 04/21/2022 1031 Last data filed at 04/20/2022 1100 Gross per 24 hour  Intake --  Output 375 ml  Net -375 ml   Filed Weights   04/19/22 0603 04/20/22 0426 04/21/22 0532  Weight: 72.9 kg 72.8 kg 72.8 kg    Physical Examination: Body mass index is 21.77 kg/m.    General: Appearance:    Well developed, well nourished male in no acute distress     Lungs:     respirations unlabored  Heart:    Normal heart rate.  MS:   All extremities are intact.   Neurologic:   Awake, alert     Data Reviewed:   CBC: Recent Labs  Lab 04/15/22 0536 04/16/22 0437 04/17/22 0439 04/18/22 0410 04/19/22 0424  WBC 6.4 6.3 8.3 6.9 7.4  HGB 13.4 12.8* 13.2 12.8* 13.0  HCT 37.2* 36.2* 37.1* 37.1* 36.1*  MCV 88.4 92.8 90.9 92.5 89.1  PLT 231 239 259 243 242    Basic Metabolic Panel: Recent Labs  Lab 04/15/22 0536 04/15/22 1310 04/16/22 0437 04/17/22 0439 04/18/22 0410 04/19/22 0424 04/20/22 0415  NA 124*   < > 122* 123* 123* 123* 124*  K 3.3*   < > 3.2* 4.5 3.8 3.6 3.7  CL 92*   < > 95* 93* 94* 91* 92*  CO2 23   < > 20* 25 21* 22 22  GLUCOSE 198*   < > 151* 128* 117* 96 103*  BUN 11   < > '9 9 14 16 15  '$ CREATININE 0.69   < > 0.57* 0.68 0.72 0.69 0.63  CALCIUM 8.7*   < > 8.0* 8.5* 8.4* 8.3* 8.4*  MG 1.7  --  1.4* 2.4 2.0 2.0  --    < > = values in this interval not displayed.      Radiology Studies: No results found.    LOS: 8  days    Geradine Girt, DO [facility] Available via Epic secure chat 7am-7pm After these hours, please refer to coverage provider listed on [email or web address] 04/21/2022, 10:31 AM

## 2022-04-21 NOTE — TOC Progression Note (Signed)
Transition of Care [facility]) - Progression Note    Patient Details  Name: Nathan Howell MRN: [id number] Date of Birth: 03/20/1935  Transition of Care [facility]) CM/SW [place], LCSW Phone Number: 04/21/2022, 9:26 AM  Clinical Narrative:    CSW received a call from pt's daughter who reported choosing [facility]. CSW spoke with Debbie pt's Josem Kaufmann is pending. TOC to follow.    Expected Discharge Plan: [place] Barriers to Discharge: Continued Medical Work up  Expected Discharge Plan and Services   Discharge Planning Services: CM Consult   Living arrangements for the past 2 months: Single Family Home                                       Social Determinants of Health (SDOH) Interventions SDOH Screenings   Food Insecurity: No Food Insecurity (04/14/2022)  Housing: Low Risk  (04/14/2022)  Transportation Needs: No Transportation Needs (04/14/2022)  Utilities: Not At Risk (04/14/2022)  Tobacco Use: High Risk (04/13/2022)    Readmission Risk Interventions     No data to display

## 2022-04-22 DIAGNOSIS — G9341 Metabolic encephalopathy: Secondary | ICD-10-CM | POA: Diagnosis not present

## 2022-04-22 LAB — CBC
HCT: 34.4 % — ABNORMAL LOW (ref 39.0–52.0)
Hemoglobin: 12 g/dL — ABNORMAL LOW (ref 13.0–17.0)
MCH: 31.6 pg (ref 26.0–34.0)
MCHC: 34.9 g/dL (ref 30.0–36.0)
MCV: 90.5 fL (ref 80.0–100.0)
Platelets: 299 10*3/uL (ref 150–400)
RBC: 3.8 MIL/uL — ABNORMAL LOW (ref 4.22–5.81)
RDW: 12.5 % (ref 11.5–15.5)
WBC: 6.1 10*3/uL (ref 4.0–10.5)
nRBC: 0 % (ref 0.0–0.2)

## 2022-04-22 LAB — BASIC METABOLIC PANEL
Anion gap: 7 (ref 5–15)
BUN: 16 mg/dL (ref 8–23)
CO2: 27 mmol/L (ref 22–32)
Calcium: 8.6 mg/dL — ABNORMAL LOW (ref 8.9–10.3)
Chloride: 94 mmol/L — ABNORMAL LOW (ref 98–111)
Creatinine, Ser: 0.77 mg/dL (ref 0.61–1.24)
GFR, Estimated: >60 mL/min (ref >60)
Glucose, Bld: 66 mg/dL — ABNORMAL LOW (ref 70–99)
Potassium: 3.6 mmol/L (ref 3.5–5.1)
Sodium: 128 mmol/L — ABNORMAL LOW (ref 135–145)

## 2022-04-22 LAB — GLUCOSE, CAPILLARY
Glucose-Capillary: 63 mg/dL — ABNORMAL LOW (ref 70–99)
Glucose-Capillary: 107 mg/dL — ABNORMAL HIGH (ref 70–99)
Glucose-Capillary: 87 mg/dL (ref 70–99)
Glucose-Capillary: 164 mg/dL — ABNORMAL HIGH (ref 70–99)
Glucose-Capillary: 282 mg/dL — ABNORMAL HIGH (ref 70–99)

## 2022-04-22 MED ORDER — B COMPLEX-C PO TABS
1.0000 | ORAL_TABLET | Freq: Every day | ORAL | Status: DC
  Administered 2022-04-22 – 2022-04-25 (×4): 1 via ORAL
  Filled 2022-04-22 (×4): qty 1

## 2022-04-22 MED ORDER — INSULIN GLARGINE-YFGN 100 UNIT/ML ~~LOC~~ SOLN
15.0000 [IU] | Freq: Every day | SUBCUTANEOUS | Status: DC
  Filled 2022-04-22: qty 0.15

## 2022-04-22 MED ORDER — INSULIN ASPART 100 UNIT/ML IJ SOLN
2.0000 [IU] | Freq: Three times a day (TID) | INTRAMUSCULAR | Status: DC
  Administered 2022-04-22: 2 [IU] via SUBCUTANEOUS

## 2022-04-22 MED ORDER — INSULIN ASPART 100 UNIT/ML IJ SOLN
0.0000 [IU] | Freq: Three times a day (TID) | INTRAMUSCULAR | Status: DC
  Administered 2022-04-22 – 2022-04-23 (×3): 3 [IU] via SUBCUTANEOUS
  Administered 2022-04-23 – 2022-04-24 (×2): 8 [IU] via SUBCUTANEOUS
  Administered 2022-04-24: 5 [IU] via SUBCUTANEOUS
  Administered 2022-04-25: 8 [IU] via SUBCUTANEOUS
  Administered 2022-04-25: 3 [IU] via SUBCUTANEOUS

## 2022-04-22 MED ORDER — INSULIN ASPART 100 UNIT/ML IJ SOLN
0.0000 [IU] | Freq: Every day | INTRAMUSCULAR | Status: DC
  Administered 2022-04-22: 2 [IU] via SUBCUTANEOUS
  Administered 2022-04-24: 3 [IU] via SUBCUTANEOUS

## 2022-04-22 MED ORDER — GLUCERNA SHAKE PO LIQD
237.0000 mL | Freq: Three times a day (TID) | ORAL | Status: DC
  Administered 2022-04-22 – 2022-04-25 (×10): 237 mL via ORAL
  Filled 2022-04-22 (×11): qty 237

## 2022-04-22 NOTE — Progress Notes (Signed)
[facility] Progress Note Patient: Nathan Howell EZM:[id number] DOB: November 21, 1934 DOA: 04/13/2022  DOS: the patient was seen and examined on 04/22/2022  Brief hospital course: PMH of CAD SP CABG, type II DM, HLD, HTN, corneal abrasion SP transplant present to the hospital complains of generalized weakness debility and poor p.o. intake.  Found to have hyponatremia and other electrolyte normality.  Currently being corrected. Assessment and Plan: Acute metabolic encephalopathy. Appears to be multifactorial. Mentation wise the patient is clear but continues to have poor p.o. intake. Will monitor.  Acute on chronic hypoosmolar hyponatremia. Sodium level improving. Monitor.  Deconditioning and recurrent fall. No acute fractures. Monitor.  Hypomagnesemia, hypokalemia. Currently being corrected.  Recurrent hypoglycemia. A1c is actually 6. Patient is on long-term insulin which we will reduce and discontinue. Continue sliding scale insulin. Monitor.  AKI. Treated with IV fluid.  Monitor.  HTN. Continue to hold HCTZ. Monitor.  HLD. Continue statin.  Adult failure to thrive Poor p.o. intake. Add Glucerna.  History of corneal transplant. Corneal abrasion. Patient supposed to follow-up with ophthalmology outpatient. Per ophthalmology note no evidence of infection. Monitor for now.   Subjective: No nausea no vomiting no fever no chills.  No chest pain.  No abdominal pain. Poor p.o. intake.  Physical Exam: General: in Mild distress, No Rash Cardiovascular: S1 and S2 Present, No Murmur Respiratory: Good respiratory effort, Bilateral Air entry present. No Crackles, No wheezes Abdomen: Bowel Sound present, No tenderness Extremities: No edema Neuro: Alert and oriented x3, no new focal deficit  Data Reviewed: I have Reviewed nursing notes, Vitals, and Lab results. Since last encounter, pertinent lab results CBC and BMP   . I have ordered test including CBC and BMP  .    Disposition: Status is: Inpatient Remains inpatient appropriate because: Need for stability in electrolytes and improvement in mentation  SCDs Start: 04/13/22 2322   Family Communication: No one at bedside Level of care: Med-Surg  Vitals:   04/21/22 2057 04/22/22 0552 04/22/22 0626 04/22/22 1232  BP: (!) 147/71 (!) 163/71 (!) 153/82 122/68  Pulse: 84 69 72 80  Resp: '18 19  16  '$ Temp: 98.5 F (36.9 C) 98.4 F (36.9 C)  98.2 F (36.8 C)  TempSrc: Oral Oral  Oral  SpO2: 97% 95%  97%  Weight:      Height:         Author: Berle Mull, MD 04/22/2022 7:24 PM  Please look on [email or web address] to find out who is on call.

## 2022-04-23 DIAGNOSIS — G9341 Metabolic encephalopathy: Secondary | ICD-10-CM | POA: Diagnosis not present

## 2022-04-23 LAB — CBC
HCT: 32 % — ABNORMAL LOW (ref 39.0–52.0)
Hemoglobin: 11.3 g/dL — ABNORMAL LOW (ref 13.0–17.0)
MCH: 32.2 pg (ref 26.0–34.0)
MCHC: 35.3 g/dL (ref 30.0–36.0)
MCV: 91.2 fL (ref 80.0–100.0)
Platelets: 271 10*3/uL (ref 150–400)
RBC: 3.51 MIL/uL — ABNORMAL LOW (ref 4.22–5.81)
RDW: 12.2 % (ref 11.5–15.5)
WBC: 7.1 10*3/uL (ref 4.0–10.5)
nRBC: 0 % (ref 0.0–0.2)

## 2022-04-23 LAB — COMPREHENSIVE METABOLIC PANEL
ALT: 13 U/L (ref 0–44)
AST: 14 U/L — ABNORMAL LOW (ref 15–41)
Albumin: 2.7 g/dL — ABNORMAL LOW (ref 3.5–5.0)
Alkaline Phosphatase: 47 U/L (ref 38–126)
Anion gap: 6 (ref 5–15)
BUN: 16 mg/dL (ref 8–23)
CO2: 27 mmol/L (ref 22–32)
Calcium: 8.2 mg/dL — ABNORMAL LOW (ref 8.9–10.3)
Chloride: 93 mmol/L — ABNORMAL LOW (ref 98–111)
Creatinine, Ser: 0.87 mg/dL (ref 0.61–1.24)
GFR, Estimated: >60 mL/min (ref >60)
Glucose, Bld: 220 mg/dL — ABNORMAL HIGH (ref 70–99)
Potassium: 4 mmol/L (ref 3.5–5.1)
Sodium: 126 mmol/L — ABNORMAL LOW (ref 135–145)
Total Bilirubin: 0.7 mg/dL (ref 0.3–1.2)
Total Protein: 5.6 g/dL — ABNORMAL LOW (ref 6.5–8.1)

## 2022-04-23 LAB — GLUCOSE, CAPILLARY
Glucose-Capillary: 194 mg/dL — ABNORMAL HIGH (ref 70–99)
Glucose-Capillary: 275 mg/dL — ABNORMAL HIGH (ref 70–99)
Glucose-Capillary: 197 mg/dL — ABNORMAL HIGH (ref 70–99)
Glucose-Capillary: 194 mg/dL — ABNORMAL HIGH (ref 70–99)

## 2022-04-23 LAB — MAGNESIUM: Magnesium: 2 mg/dL (ref 1.7–2.4)

## 2022-04-23 MED ORDER — INSULIN GLARGINE-YFGN 100 UNIT/ML ~~LOC~~ SOLN
10.0000 [IU] | Freq: Every day | SUBCUTANEOUS | Status: DC
  Administered 2022-04-23: 10 [IU] via SUBCUTANEOUS
  Filled 2022-04-23 (×2): qty 0.1

## 2022-04-23 NOTE — Progress Notes (Addendum)
[facility] Progress Note Patient: Nathan Howell LHT:[id number] DOB: [birth year] DOA: 04/13/2022  DOS: the patient was seen and examined on 04/23/2022  Brief hospital course: PMH of CAD SP CABG, type II DM, HLD, HTN, corneal abrasion SP transplant present to the hospital complains of generalized weakness debility and poor p.o. intake.  Found to have hyponatremia and other electrolyte normality.  Currently being corrected. Assessment and Plan: Acute metabolic encephalopathy. Appears to be multifactorial. Mentation wise the patient is clear and close to baseline.   Acute on chronic hypoosmolar hyponatremia. Sodium level improving. Monitor.   Deconditioning and recurrent fall. No acute fractures. Monitor.   Hypomagnesemia, hypokalemia. Currently being corrected.   Recurrent hypoglycemia.  Type 2 diabetes mellitus.  With long-term insulin use.  With HLD. A1c is actually 6. Patient is on long-term insulin due to recurrent hypoglycemia currently medication on hold. Continue sliding scale insulin. Monitor.   AKI. Treated with IV fluid.  Monitor.   HTN. Continue to hold HCTZ. Monitor.   HLD. Continue statin.   Adult failure to thrive Poor p.o. intake. Add Glucerna.   History of corneal transplant. Corneal abrasion. Patient supposed to follow-up with ophthalmology outpatient. Per ophthalmology note no evidence of infection. Monitor for now.  Addendum: Goals of care conversation:  Pt was admitted as full code 1/16 pt was switched to DNR per son and daughter per documentation from Dr Nathan Howell.  1/21 per RN son wants the pt changed back to full code. Per RN Nathan Howell, Pt also wants to switch to full code.  Order changed.  Nathan Howell 3:45 PM 04/23/2022     Subjective: No nausea no vomiting no fever no chills.  Sugar levels improving.  Oral intake improving.  Physical Exam: General: in Mild distress, No Rash Cardiovascular: S1 and S2 Present, No  Murmur Respiratory: Good respiratory effort, Bilateral Air entry present. No Crackles, No wheezes Abdomen: Bowel Sound present, No tenderness Extremities: No edema Neuro: Alert and oriented x self and hospital, no new focal deficit  Data Reviewed: I have Reviewed nursing notes, Vitals, and Lab results. Since last encounter, pertinent lab results CBC and BMP   . I have ordered test including CBC and BMP  .   Disposition: Status is: Inpatient Remains inpatient appropriate because: Unsafe discharge.  Awaiting authorization.  Medically stable now.  SCDs Start: 04/13/22 2322   Family Communication: No one at bedside Level of care: Med-Surg  Vitals:   04/23/22 0456 04/23/22 0502 04/23/22 0937 04/23/22 1349  BP:  117/63 123/61 129/67  Pulse:  73 84 71  Resp:  16  17  Temp:  98.8 F (37.1 C)  97.9 F (36.6 C)  TempSrc:  Oral  Oral  SpO2:  96%  98%  Weight: 72.6 kg     Height:         Author: Berle Mull, MD 04/23/2022 2:00 PM  Please look on [email or web address] to find out who is on call.

## 2022-04-23 NOTE — Progress Notes (Signed)
Occupational Therapy Treatment Patient Details Name: Nathan Howell MRN: [id number] DOB: [birth year] Today's Date: 04/23/2022   History of present illness Patient is a 88 year old male who presented after a fall at home. patient admitted with acute met encephalopathy. Hx of dementia, DM, COPD, asthma, CAD, CABG, chronic diarrhea, pacemaker, anemia, CHF   OT comments  Patient made progress towards goals for LB dressing. Patient was able to don/doff shoes and socks seated in recliner with min guard and increased time.patient continues to require increased A for standing and transfers to maintain safety impacting ability to safely transition home alone at this time. Patient's discharge plan remains appropriate at this time. OT will continue to follow acutely.     Recommendations for follow up therapy are one component of a multi-disciplinary discharge planning process, led by the attending physician.  Recommendations may be updated based on patient status, additional functional criteria and insurance authorization.    Follow Up Recommendations  Skilled nursing-short term rehab (<3 hours/day)     Assistance Recommended at Discharge Frequent or constant Supervision/Assistance  Patient can return home with the following  A lot of help with walking and/or transfers;A lot of help with bathing/dressing/bathroom;Assistance with cooking/housework;Direct supervision/assist for medications management;Assist for transportation;Direct supervision/assist for financial management;Help with stairs or ramp for entrance   Equipment Recommendations  Other (comment) (defer to next venue)       Precautions / Restrictions Precautions Precautions: Fall Precaution Comments: needs L hearing aid in place Restrictions Weight Bearing Restrictions: No              ADL either performed or assessed with clinical judgement   ADL Overall ADL's : Needs assistance/impaired       Lower Body Dressing:  Sitting/lateral leans;Min guard Lower Body Dressing Details (indicate cue type and reason): to doff gripper socks and don typical socks and loafers per patient request with increased time. Toilet Transfer: Maximal assistance;Rolling walker (2 wheels) Toilet Transfer Details (indicate cue type and reason): patient was mod A to transfer from edge of bed to recliner in room with patient needing increased physical A to make it into recliner when attempting to sit without backing up to chair all the way first dispite cues provided.                  Cognition Arousal/Alertness: Awake/alert Behavior During Therapy: Flat affect Overall Cognitive Status: Within Functional Limits for tasks assessed       General Comments: once hearing aid was in place patient was able to cooparate and follow commands. much more alert than previous session. no lethargy today. noted to be confused reporting that shoes were in room next door and we needed to go get them. patietn was provided with shoes in room and he reported that those were the ones.        Exercises Other Exercises Other Exercises: Patient was able to participate in 3 recliner push ups with increasec time and cues. patient            Pertinent Vitals/ Pain       Pain Assessment Pain Assessment: No/denies pain         Frequency  Min 2X/week        Progress Toward Goals  OT Goals(current goals can now be found in the care plan section)  Progress towards OT goals: Progressing toward goals     Plan         AM-PAC OT "6 Clicks" Daily Activity  Outcome Measure   Help from another person eating meals?: A Little Help from another person taking care of personal grooming?: A Little Help from another person toileting, which includes using toliet, bedpan, or urinal?: A Lot Help from another person bathing (including washing, rinsing, drying)?: A Lot Help from another person to put on and taking off regular upper body  clothing?: A Little Help from another person to put on and taking off regular lower body clothing?: A Little 6 Click Score: 16    End of Session Equipment Utilized During Treatment: Rolling walker (2 wheels);Gait belt  OT Visit Diagnosis: Unsteadiness on feet (R26.81);Other abnormalities of gait and mobility (R26.89);Muscle weakness (generalized) (M62.81)   Activity Tolerance Patient tolerated treatment well;Patient limited by fatigue   Patient Left in chair;with call bell/phone within reach;with chair alarm set   Nurse Communication Mobility status        Time: 1224-8250 OT Time Calculation (min): 22 min  Charges: OT General Charges $OT Visit: 1 Visit OT Treatments $Self Care/Home Management : 8-22 mins  Rennie Plowman, MS Acute Rehabilitation Department Office# [phone]   Willa Rough 04/23/2022, 11:41 AM

## 2022-04-23 NOTE — Progress Notes (Signed)
Patient's son-Terry came out of the room stated the patient is asking why he had the DNR armband on, followed son back to the room, explained to patient what the DNR armband is for, patient stated he wants to be resuscitated. Dr. Posey Pronto notified on patient's intention, son at the bedside stated he notified his other family members.

## 2022-04-24 DIAGNOSIS — G9341 Metabolic encephalopathy: Secondary | ICD-10-CM | POA: Diagnosis not present

## 2022-04-24 LAB — COMPREHENSIVE METABOLIC PANEL
ALT: 14 U/L (ref 0–44)
AST: 14 U/L — ABNORMAL LOW (ref 15–41)
Albumin: 2.8 g/dL — ABNORMAL LOW (ref 3.5–5.0)
Alkaline Phosphatase: 45 U/L (ref 38–126)
Anion gap: 7 (ref 5–15)
BUN: 18 mg/dL (ref 8–23)
CO2: 24 mmol/L (ref 22–32)
Calcium: 8.3 mg/dL — ABNORMAL LOW (ref 8.9–10.3)
Chloride: 94 mmol/L — ABNORMAL LOW (ref 98–111)
Creatinine, Ser: 0.73 mg/dL (ref 0.61–1.24)
GFR, Estimated: >60 mL/min (ref >60)
Glucose, Bld: 240 mg/dL — ABNORMAL HIGH (ref 70–99)
Potassium: 3.9 mmol/L (ref 3.5–5.1)
Sodium: 125 mmol/L — ABNORMAL LOW (ref 135–145)
Total Bilirubin: 0.4 mg/dL (ref 0.3–1.2)
Total Protein: 5.8 g/dL — ABNORMAL LOW (ref 6.5–8.1)

## 2022-04-24 LAB — CBC
HCT: 32.4 % — ABNORMAL LOW (ref 39.0–52.0)
Hemoglobin: 11.4 g/dL — ABNORMAL LOW (ref 13.0–17.0)
MCH: 32.2 pg (ref 26.0–34.0)
MCHC: 35.2 g/dL (ref 30.0–36.0)
MCV: 91.5 fL (ref 80.0–100.0)
Platelets: 292 10*3/uL (ref 150–400)
RBC: 3.54 MIL/uL — ABNORMAL LOW (ref 4.22–5.81)
RDW: 12.4 % (ref 11.5–15.5)
WBC: 5.8 10*3/uL (ref 4.0–10.5)
nRBC: 0 % (ref 0.0–0.2)

## 2022-04-24 LAB — GLUCOSE, CAPILLARY
Glucose-Capillary: 210 mg/dL — ABNORMAL HIGH (ref 70–99)
Glucose-Capillary: 257 mg/dL — ABNORMAL HIGH (ref 70–99)
Glucose-Capillary: 227 mg/dL — ABNORMAL HIGH (ref 70–99)
Glucose-Capillary: 257 mg/dL — ABNORMAL HIGH (ref 70–99)

## 2022-04-24 LAB — MAGNESIUM: Magnesium: 1.9 mg/dL (ref 1.7–2.4)

## 2022-04-24 MED ORDER — INSULIN GLARGINE-YFGN 100 UNIT/ML ~~LOC~~ SOLN
15.0000 [IU] | Freq: Every day | SUBCUTANEOUS | Status: DC
  Administered 2022-04-24: 15 [IU] via SUBCUTANEOUS
  Filled 2022-04-24 (×2): qty 0.15

## 2022-04-24 NOTE — Progress Notes (Signed)
Physical Therapy Treatment Patient Details Name: Nathan Howell MRN: [id number] DOB: 10-03-1934 Today's Date: 04/24/2022   History of Present Illness Patient is a 88 year old male who presented after a fall at home. patient admitted with acute met encephalopathy. Hx of dementia, DM, COPD, asthma, CAD, CABG, chronic diarrhea, pacemaker, anemia, CHF    PT Comments    Pt assisted to sitting EOB and RN provided morning meds.  Pt able to ambulate short distance in hallway however fatigued quickly.  Pt overall min assist for mobility today.  Grandson present for session.  Continue to recommend SNF upon d/c.    Recommendations for follow up therapy are one component of a multi-disciplinary discharge planning process, led by the attending physician.  Recommendations may be updated based on patient status, additional functional criteria and insurance authorization.  Follow Up Recommendations  Skilled nursing-short term rehab (<3 hours/day) Can patient physically be transported by private vehicle: Yes   Assistance Recommended at Discharge Frequent or constant Supervision/Assistance  Patient can return home with the following A little help with walking and/or transfers;A little help with bathing/dressing/bathroom;Assistance with cooking/housework;Assist for transportation;Help with stairs or ramp for entrance   Equipment Recommendations  Rolling walker (2 wheels)    Recommendations for Other Services       Precautions / Restrictions Precautions Precautions: Fall Precaution Comments: needs L hearing aid in place     Mobility  Bed Mobility Overal bed mobility: Needs Assistance Bed Mobility: Supine to Sit     Supine to sit: Min assist     General bed mobility comments: verbal cues for positioning, assist for trunk upright    Transfers Overall transfer level: Needs assistance Equipment used: Rolling walker (2 wheels) Transfers: Sit to/from Stand Sit to Stand: Min assist            General transfer comment: verbal cues for hand placement and weight shifting, assist to rise, steady and control descent as pt did not reach back for armrest as cued    Ambulation/Gait Ambulation/Gait assistance: Min assist Gait Distance (Feet): 50 Feet Assistive device: Rolling walker (2 wheels) Gait Pattern/deviations: Step-through pattern, Decreased stride length, Trunk flexed Gait velocity: decreased     General Gait Details: verbal cues for step length, RW positioning, posture; distance to tolerance; pt fatigued quickly   Stairs             Wheelchair Mobility    Modified Rankin (Stroke Patients Only)       Balance Overall balance assessment: Needs assistance Sitting-balance support: Feet supported, Single extremity supported Sitting balance-Leahy Scale: Poor     Standing balance support: Bilateral upper extremity supported, Reliant on assistive device for balance, During functional activity Standing balance-Leahy Scale: Poor                              Cognition Arousal/Alertness: Awake/alert Behavior During Therapy: Flat affect Overall Cognitive Status: Within Functional Limits for tasks assessed                                          Exercises      General Comments        Pertinent Vitals/Pain      Home Living  Prior Function            PT Goals (current goals can now be found in the care plan section) Progress towards PT goals: Progressing toward goals    Frequency    Min 2X/week      PT Plan Current plan remains appropriate    Co-evaluation              AM-PAC PT "6 Clicks" Mobility   Outcome Measure  Help needed turning from your back to your side while in a flat bed without using bedrails?: A Lot Help needed moving from lying on your back to sitting on the side of a flat bed without using bedrails?: A Lot Help needed moving to and from a bed to  a chair (including a wheelchair)?: A Lot Help needed standing up from a chair using your arms (e.g., wheelchair or bedside chair)?: A Lot Help needed to walk in hospital room?: A Lot Help needed climbing 3-5 steps with a railing? : A Lot 6 Click Score: 12    End of Session Equipment Utilized During Treatment: Gait belt Activity Tolerance: Patient tolerated treatment well Patient left: in chair;with call bell/phone within reach;with chair alarm set;with family/visitor present;with nursing/sitter in room Nurse Communication: Mobility status PT Visit Diagnosis: History of falling (Z91.81);Muscle weakness (generalized) (M62.81);Difficulty in walking, not elsewhere classified (R26.2)     Time: 1015-1030 PT Time Calculation (min) (ACUTE ONLY): 15 min  Charges:  $Gait Training: 8-22 mins                    Jannette Spanner PT, DPT Physical Therapist Acute Rehabilitation Services Preferred contact method: Secure Chat Weekend Pager Only: [phone] Office: [place] 04/24/2022, 2:19 PM

## 2022-04-24 NOTE — Progress Notes (Signed)
[facility] PROGRESS NOTE  Patient: Nathan Howell JWL:[id number]   PCP: Burnard Bunting, MD DOB: 1934-08-31   DOA: 04/13/2022   DOS: 04/24/2022    Subjective: No nausea no vomiting no fever no chills.  No acute complaint.  Objective:   Vitals:   04/24/22 0500 04/24/22 0507 04/24/22 1016 04/24/22 1253  BP:  102/61 132/68 133/64  Pulse:  82 79 65  Resp:  18  16  Temp:  98.7 F (37.1 C)  (!) 97.5 F (36.4 C)  TempSrc:  Oral  Oral  SpO2:  98%  99%  Weight: 72.8 kg     Height:        S1-S2 present. Bowel sound present but To auscultation. No edema.  Assessment and plan: Active Issues Hyponatremia. Sodium 145 although corrected sodium is actually stable but Will monitor.  Hyperglycemia. Adjusting insulin therapy.  Brief Hospital course PMH of CAD SP CABG, type II DM, HLD, HTN, corneal abrasion SP transplant present to the hospital complains of generalized weakness debility and poor p.o. intake.  Found to have hyponatremia and other electrolyte normality.  Currently being corrected.   Principal Problem:   Acute metabolic encephalopathy Active Problems:   DM2 (diabetes mellitus, type 2) (HCC)   Essential hypertension   Hyperlipidemia   Acute hyponatremia   Acute prerenal azotemia   Hypoglycemia   Chronic diastolic CHF (congestive heart failure) (HCC)   Anemia of chronic disease   Hypokalemia   Hypomagnesemia    Author: Berle Mull, MD [facility] 04/24/2022 6:31 PM   If 7PM-7AM, please contact night-coverage at [email or web address]

## 2022-04-24 NOTE — TOC Progression Note (Addendum)
Transition of Care [facility]) - Progression Note    Patient Details  Name: Nathan Howell MRN: [id number] Date of Birth: 20-Jun-1934  Transition of Care [facility]) CM/SW Contact  Leeroy Cha, RN Phone Number: 04/24/2022, 1:13 PM  Clinical Narrative:     Myles Rosenthal at [facility] to see if auth from [facility] is back> message left to return call. Tct-DEbbief-auth has been approved will have a bed for him in the am.  [id number].  Will let md and family know. Expected Discharge Plan: [place] Barriers to Discharge: Continued Medical Work up  Expected Discharge Plan and Services   Discharge Planning Services: CM Consult   Living arrangements for the past 2 months: Single Family Home                                       Social Determinants of Health (SDOH) Interventions SDOH Screenings   Food Insecurity: No Food Insecurity (04/14/2022)  Housing: Low Risk  (04/14/2022)  Transportation Needs: No Transportation Needs (04/14/2022)  Utilities: Not At Risk (04/14/2022)  Tobacco Use: High Risk (04/13/2022)    Readmission Risk Interventions   No data to display

## 2022-04-24 NOTE — Care Management Important Message (Signed)
Important Message  Patient Details IM Letter given. Name: Nathan Howell MRN: [id number] Date of Birth: June 25, 1934   Medicare Important Message Given:  Yes     Kerin Salen 04/24/2022, 1:17 PM

## 2022-04-24 NOTE — Inpatient Diabetes Management (Signed)
Inpatient Diabetes Program Recommendations  AACE/ADA: New Consensus Statement on Inpatient Glycemic Control (2015)  Target Ranges:  Prepandial:   less than 140 mg/dL      Peak postprandial:   less than 180 mg/dL (1-2 hours)      Critically ill patients:  140 - 180 mg/dL    Latest Reference Range & Units 04/23/22 08:04 04/23/22 11:38 04/23/22 17:04 04/23/22 20:25  Glucose-Capillary 70 - 99 mg/dL 194 (H) 275 (H) 197 (H) 194 (H)  (H): Data is abnormally high   Latest Reference Range & Units 04/24/22 08:03 04/24/22 12:10  Glucose-Capillary 70 - 99 mg/dL 210 (H)  5 units Novolog  257 (H)  8 units Novolog  15 units Semglee '@1016'$   (H): Data is abnormally high      Home DM Meds: Glipizide 5 mg BID        Levemir 19 units QAM        Metformin 1000 mg QAM  Current Orders: Semglee 15 units Daily      Novolog Moderate Correction Scale/ SSI (0-15 units) TID AC + HS    MD- Note Semglee increased to 15 units daily this AM (was 10 units daily)  Pt eating 50-75% meals and getting PO supps TID  Please consider adding Novolog Meal Coverage: Novolog 4 units TID with meals HOLD if pt NPO HOLD if pt eats <50% meals    --Will follow patient during hospitalization--  Wyn Quaker RN, MSN, [place] Diabetes Coordinator Inpatient Glycemic Control Team Team Pager: [phone] (8a-5p)

## 2022-04-25 DIAGNOSIS — E876 Hypokalemia: Secondary | ICD-10-CM | POA: Diagnosis not present

## 2022-04-25 DIAGNOSIS — H16011 Central corneal ulcer, right eye: Secondary | ICD-10-CM | POA: Diagnosis not present

## 2022-04-25 DIAGNOSIS — Z7401 Bed confinement status: Secondary | ICD-10-CM | POA: Diagnosis not present

## 2022-04-25 DIAGNOSIS — N19 Unspecified kidney failure: Secondary | ICD-10-CM | POA: Diagnosis not present

## 2022-04-25 DIAGNOSIS — E871 Hypo-osmolality and hyponatremia: Secondary | ICD-10-CM | POA: Diagnosis not present

## 2022-04-25 DIAGNOSIS — E162 Hypoglycemia, unspecified: Secondary | ICD-10-CM | POA: Diagnosis not present

## 2022-04-25 DIAGNOSIS — F32A Depression, unspecified: Secondary | ICD-10-CM | POA: Diagnosis not present

## 2022-04-25 DIAGNOSIS — L89613 Pressure ulcer of right heel, stage 3: Secondary | ICD-10-CM | POA: Diagnosis not present

## 2022-04-25 DIAGNOSIS — Z794 Long term (current) use of insulin: Secondary | ICD-10-CM | POA: Diagnosis not present

## 2022-04-25 DIAGNOSIS — I10 Essential (primary) hypertension: Secondary | ICD-10-CM | POA: Diagnosis not present

## 2022-04-25 DIAGNOSIS — D638 Anemia in other chronic diseases classified elsewhere: Secondary | ICD-10-CM | POA: Diagnosis not present

## 2022-04-25 DIAGNOSIS — R239 Unspecified skin changes: Secondary | ICD-10-CM | POA: Diagnosis not present

## 2022-04-25 DIAGNOSIS — I2510 Atherosclerotic heart disease of native coronary artery without angina pectoris: Secondary | ICD-10-CM | POA: Diagnosis not present

## 2022-04-25 DIAGNOSIS — Z950 Presence of cardiac pacemaker: Secondary | ICD-10-CM | POA: Diagnosis not present

## 2022-04-25 DIAGNOSIS — M6281 Muscle weakness (generalized): Secondary | ICD-10-CM | POA: Diagnosis not present

## 2022-04-25 DIAGNOSIS — E785 Hyperlipidemia, unspecified: Secondary | ICD-10-CM | POA: Diagnosis not present

## 2022-04-25 DIAGNOSIS — R296 Repeated falls: Secondary | ICD-10-CM | POA: Diagnosis not present

## 2022-04-25 DIAGNOSIS — R293 Abnormal posture: Secondary | ICD-10-CM | POA: Diagnosis not present

## 2022-04-25 DIAGNOSIS — R799 Abnormal finding of blood chemistry, unspecified: Secondary | ICD-10-CM | POA: Diagnosis not present

## 2022-04-25 DIAGNOSIS — E1169 Type 2 diabetes mellitus with other specified complication: Secondary | ICD-10-CM | POA: Diagnosis not present

## 2022-04-25 DIAGNOSIS — R531 Weakness: Secondary | ICD-10-CM | POA: Diagnosis not present

## 2022-04-25 DIAGNOSIS — R69 Illness, unspecified: Secondary | ICD-10-CM | POA: Diagnosis not present

## 2022-04-25 DIAGNOSIS — G9341 Metabolic encephalopathy: Secondary | ICD-10-CM | POA: Diagnosis not present

## 2022-04-25 DIAGNOSIS — Z743 Need for continuous supervision: Secondary | ICD-10-CM | POA: Diagnosis not present

## 2022-04-25 DIAGNOSIS — R269 Unspecified abnormalities of gait and mobility: Secondary | ICD-10-CM | POA: Diagnosis not present

## 2022-04-25 DIAGNOSIS — I5032 Chronic diastolic (congestive) heart failure: Secondary | ICD-10-CM | POA: Diagnosis not present

## 2022-04-25 DIAGNOSIS — Z741 Need for assistance with personal care: Secondary | ICD-10-CM | POA: Diagnosis not present

## 2022-04-25 DIAGNOSIS — E119 Type 2 diabetes mellitus without complications: Secondary | ICD-10-CM | POA: Diagnosis not present

## 2022-04-25 LAB — COMPREHENSIVE METABOLIC PANEL
ALT: 15 U/L (ref 0–44)
AST: 14 U/L — ABNORMAL LOW (ref 15–41)
Albumin: 2.7 g/dL — ABNORMAL LOW (ref 3.5–5.0)
Alkaline Phosphatase: 38 U/L (ref 38–126)
Anion gap: 7 (ref 5–15)
BUN: 17 mg/dL (ref 8–23)
CO2: 25 mmol/L (ref 22–32)
Calcium: 8.2 mg/dL — ABNORMAL LOW (ref 8.9–10.3)
Chloride: 93 mmol/L — ABNORMAL LOW (ref 98–111)
Creatinine, Ser: 0.85 mg/dL (ref 0.61–1.24)
GFR, Estimated: >60 mL/min (ref >60)
Glucose, Bld: 235 mg/dL — ABNORMAL HIGH (ref 70–99)
Potassium: 4 mmol/L (ref 3.5–5.1)
Sodium: 125 mmol/L — ABNORMAL LOW (ref 135–145)
Total Bilirubin: 0.4 mg/dL (ref 0.3–1.2)
Total Protein: 5.5 g/dL — ABNORMAL LOW (ref 6.5–8.1)

## 2022-04-25 LAB — CBC
HCT: 31.1 % — ABNORMAL LOW (ref 39.0–52.0)
Hemoglobin: 10.8 g/dL — ABNORMAL LOW (ref 13.0–17.0)
MCH: 31.8 pg (ref 26.0–34.0)
MCHC: 34.7 g/dL (ref 30.0–36.0)
MCV: 91.5 fL (ref 80.0–100.0)
Platelets: 288 10*3/uL (ref 150–400)
RBC: 3.4 MIL/uL — ABNORMAL LOW (ref 4.22–5.81)
RDW: 12.3 % (ref 11.5–15.5)
WBC: 6.5 10*3/uL (ref 4.0–10.5)
nRBC: 0 % (ref 0.0–0.2)

## 2022-04-25 LAB — GLUCOSE, CAPILLARY
Glucose-Capillary: 186 mg/dL — ABNORMAL HIGH (ref 70–99)
Glucose-Capillary: 291 mg/dL — ABNORMAL HIGH (ref 70–99)

## 2022-04-25 LAB — MAGNESIUM: Magnesium: 1.9 mg/dL (ref 1.7–2.4)

## 2022-04-25 MED ORDER — DOXYCYCLINE MONOHYDRATE 100 MG PO CAPS: 100.0000 mg | ORAL_CAPSULE | Freq: Two times a day (BID) | ORAL | 0 refills | Status: AC

## 2022-04-25 MED ORDER — B COMPLEX-C PO TABS: 1.0000 | ORAL_TABLET | Freq: Every day | ORAL | 0 refills | Status: AC

## 2022-04-25 MED ORDER — INSULIN GLARGINE-YFGN 100 UNIT/ML ~~LOC~~ SOLN
20.0000 [IU] | Freq: Every day | SUBCUTANEOUS | Status: DC
  Administered 2022-04-25: 20 [IU] via SUBCUTANEOUS
  Filled 2022-04-25: qty 0.2

## 2022-04-25 MED ORDER — MAGNESIUM OXIDE -MG SUPPLEMENT 400 (240 MG) MG PO TABS: 400.0000 mg | ORAL_TABLET | Freq: Two times a day (BID) | ORAL | 0 refills | Status: AC

## 2022-04-25 MED ORDER — GLUCERNA SHAKE PO LIQD: 237.0000 mL | Freq: Three times a day (TID) | ORAL | 0 refills | Status: AC

## 2022-04-25 NOTE — Progress Notes (Signed)
Report called to the facility. Spoke with [place], RN , all questions answered.

## 2022-04-25 NOTE — TOC Transition Note (Addendum)
Transition of Care [facility]) - CM/SW Discharge Note   Patient Details  Name: Nathan Howell MRN: [id number] Date of Birth: June 23, 1934  Transition of Care [facility]) CM/SW Contact:  Leeroy Cha, RN Phone Number: 04/25/2022, 9:32 AM   Clinical Narrative:    Patient discharge to go to [place].  Ptar called at [place].  Nursing staff notified and transport packet to the nursing station. Patient was ordered as a full code on 012124 after dnf written on [phone] by Dr. Eliseo Squires.  Question sent to Dr. Posey Pronto has to what is he actual code status. Patient is a full code   Barriers to Discharge: Continued Medical Work up   Patient Goals and CMS Choice      Discharge Placement                         Discharge Plan and Services Additional resources added to the After Visit Summary for     Discharge Planning Services: CM Consult                                 Social Determinants of Health (SDOH) Interventions SDOH Screenings   Food Insecurity: No Food Insecurity (04/14/2022)  Housing: Low Risk  (04/14/2022)  Transportation Needs: No Transportation Needs (04/14/2022)  Utilities: Not At Risk (04/14/2022)  Tobacco Use: High Risk (04/13/2022)     Readmission Risk Interventions   No data to display

## 2022-04-25 NOTE — Progress Notes (Signed)
Patient discharged: with PTAR to facility   MDE:[id number]  Discharge paperwork given: to PTAR staff  IV disconnected  Belongings given to patient and staff Daughter informed about patient leaving the hospital.

## 2022-04-25 NOTE — Discharge Summary (Signed)
Physician Discharge Summary   Patient: Nathan Howell MRN: [id number] DOB: 04-30-1934  Admit date:     04/13/2022  Discharge date: 04/25/22  Discharge Physician: Berle Mull  PCP: Burnard Bunting, MD  Recommendations at discharge: Follow-up with PCP in 1 week. Follow-up with ophthalmology as recommended. Please correct sodium for glucose values in future   Follow-up Information     Burnard Bunting, MD. Schedule an appointment as soon as possible for a visit in 1 week(s).   Specialty: Internal Medicine Contact information: [address] [place] [address] [phone]         Ophthalmology. Schedule an appointment as soon as possible for a visit in 1 week(s).                 Discharge Diagnoses: Principal Problem:   Acute metabolic encephalopathy Active Problems:   DM2 (diabetes mellitus, type 2) (HCC)   Essential hypertension   Hyperlipidemia   Acute hyponatremia   Acute prerenal azotemia   Hypoglycemia   Chronic diastolic CHF (congestive heart failure) (HCC)   Anemia of chronic disease   Hypokalemia   Hypomagnesemia  Hospital Course: PMH of CAD SP CABG, type II DM, HLD, HTN, corneal abrasion SP transplant present to the hospital complains of generalized weakness debility and poor p.o. intake.  Found to have hyponatremia and other electrolyte normality.  Currently being corrected.  Assessment and Plan Acute metabolic encephalopathy. Appears to be multifactorial. Mentation wise the patient is clear and close to baseline. No obstructions at the time of my evaluation. No focal deficit. CT scan does show evidence of brain atrophy.  Consistent with his dementia.   Acute on chronic hypoosmolar hyponatremia. Sodium level improving.  Currently lab value is at 125 but corrected for glucose value is 128 which is where the patient has been in the long time. I would not try to correct sodium any further than this unless naturally happens. Recommend to  stop taking Maxzide diuretic on discharge. Monitor.   Deconditioning and recurrent fall. CT head and CT C-spine on admission shows no acute fractures. Going to SNF.  Monitor.   Hypomagnesemia, hypokalemia. Currently being corrected.   Type 2 diabetes mellitus.  Uncontrolled with hyper and hypoglycemia with long-term insulin use.  With HLD. A1c is actually 6.2 Patient is on long-term insulin due to recurrent hypoglycemia  currently medication on hold. Monitor.   Does not have AKI. Mild renal insufficiency.  Treated with IV fluids.  Monitor.   HTN. Blood pressure stable.  Continue with Norvasc.  Discontinue HCTZ due to hyponatremia. Monitor.   HLD. Continue statin.   Adult failure to thrive Poor p.o. intake. Add Glucerna.   History of corneal transplant. Corneal abrasion. Patient supposed to follow-up with ophthalmology outpatient. Per ophthalmology note no evidence of infection. On doxycycline therapy for now.  Will prescribe for 10 more days but recommend the patient to follow-up with ophthalmology as soon as possible. Monitor for now.   Goals of care Pt was admitted as full code 1/16 pt was switched to DNR per son and daughter per documentation from Dr Louanne Belton.  1/21 per RN son wants the pt changed back to full code. Per RN [place] Slider K, Pt also wants to switch to full code.  Order changed.   Consultants:  none  Procedures performed:  none  DISCHARGE MEDICATION: Allergies as of 04/25/2022       Reactions   Ranitidine Diarrhea, Other (See Comments)   Had a lot of diarrhea on this  med        Medication List     STOP taking these medications    losartan 100 MG tablet Commonly known as: COZAAR   triamterene-hydrochlorothiazide 37.5-25 MG tablet Commonly known as: MAXZIDE-25       TAKE these medications    amLODipine 5 MG tablet Commonly known as: NORVASC Take 5 mg by mouth daily.   aspirin 81 MG tablet Take 81 mg by mouth daily.    B-complex with vitamin C tablet Take 1 tablet by mouth daily. Start taking on: April 26, 2022   budesonide 3 MG 24 hr capsule Commonly known as: ENTOCORT EC Take 2 tablets daily. What changed:  how much to take how to take this when to take this additional instructions   donepezil 10 MG tablet Commonly known as: ARICEPT Take 10 mg by mouth at bedtime. What changed: Another medication with the same name was removed. Continue taking this medication, and follow the directions you see here.   dorzolamide-timolol 2-0.5 % ophthalmic solution Commonly known as: COSOPT Place 1 drop into the right eye 2 (two) times daily.   doxycycline 100 MG capsule Commonly known as: MONODOX Take 1 capsule (100 mg total) by mouth 2 (two) times daily for 10 days.   erythromycin ophthalmic ointment Place 1 Application into the right eye See admin instructions. APPLY A SMALL AMOUNT TO RIGHT EYE EVERY 2 HOURS WHILE AWAKE   feeding supplement (GLUCERNA SHAKE) Liqd Take 237 mLs by mouth 3 (three) times daily between meals.   glipiZIDE 5 MG tablet Commonly known as: GLUCOTROL Take 5 mg by mouth 2 (two) times daily before a meal.   Levemir FlexTouch 100 UNIT/ML FlexPen Generic drug: insulin detemir Inject 19 Units into the skin daily before breakfast.   magnesium oxide 400 (240 Mg) MG tablet Commonly known as: MAG-OX Take 1 tablet (400 mg total) by mouth 2 (two) times daily.   metFORMIN 500 MG 24 hr tablet Commonly known as: GLUCOPHAGE-XR Take 2 tablets (1,000 mg total) by mouth daily with breakfast.   metoprolol tartrate 25 MG tablet Commonly known as: LOPRESSOR Keep scheduled follow up appointment. What changed:  how much to take how to take this when to take this   moxifloxacin 0.5 % ophthalmic solution Commonly known as: VIGAMOX Place 1 drop into the right eye 4 (four) times daily.   rosuvastatin 20 MG tablet Commonly known as: CRESTOR TAKE 1 TABLET DAILY   Systane Complete  0.6 % Soln Generic drug: Propylene Glycol Place 1 drop into both eyes See admin instructions. Instill 1 drop into affected eye(s) three times a day as needed for dryness and/or irritation       Disposition: SNF Diet recommendation: Carb modified diet  Discharge Exam: Vitals:   04/24/22 1253 04/24/22 2200 04/25/22 0539 04/25/22 0828  BP: 133/64 (!) 141/61 (!) 128/55 126/65  Pulse: 65 79 67 80  Resp: '16 17 19 19  '$ Temp: (!) 97.5 F (36.4 C) 98.2 F (36.8 C) 98.2 F (36.8 C) 98.2 F (36.8 C)  TempSrc: Oral Oral Oral Oral  SpO2: 99% 97% 95% 95%  Weight:   72.9 kg   Height:       General: Appear in no distress; no visible Abnormal Neck Mass Or lumps, Conjunctiva normal Cardiovascular: S1 and S2 Present, no Murmur, Respiratory: good respiratory effort, Bilateral Air entry present and CTA, no Crackles, no wheezes Abdomen: Bowel Sound present, Non tender  Extremities: no Pedal edema Neurology: alert and oriented to time,  place, and person  [facility] Weights   04/23/22 0456 04/24/22 0500 04/25/22 0539  Weight: 72.6 kg 72.8 kg 72.9 kg   Condition at discharge: stable  The results of significant diagnostics from this hospitalization (including imaging, microbiology, ancillary and laboratory) are listed below for reference.   Imaging Studies: DG Chest Port 1 View  Result Date: 04/14/2022 CLINICAL DATA:  Acute hyponatremia EXAM: PORTABLE CHEST 1 VIEW COMPARISON:  02/01/2019 FINDINGS: Sternotomy and CABG. Stable cardiomediastinal silhouette. No focal consolidation, pleural effusion, or pneumothorax. No acute osseous abnormality. IMPRESSION: No active disease. Electronically Signed   By: Placido Sou M.D.   On: 04/14/2022 01:23   CT Cervical Spine Wo Contrast  Result Date: 04/13/2022 CLINICAL DATA:  Trauma, fall EXAM: CT CERVICAL SPINE WITHOUT CONTRAST TECHNIQUE: Multidetector CT imaging of the cervical spine was performed without intravenous contrast. Multiplanar CT image  reconstructions were also generated. RADIATION DOSE REDUCTION: This exam was performed according to the departmental dose-optimization program which includes automated exposure control, adjustment of the mA and/or kV according to patient size and/or use of iterative reconstruction technique. COMPARISON:  None Available. FINDINGS: Alignment: Alignment of posterior margins of vertebral bodies is within normal limits. Skull base and vertebrae: No recent fracture is seen. Degenerative changes are noted. Soft tissues and spinal canal: There is thickening and calcifications in transverse ligament posterior to the odontoid process. This may be due to chronic inflammation. Posterior bony spurs are causing extrinsic pressure lower the ventral margin of thecal sac, more so at C3-C4 level. Disc levels: There is mild encroachment of neural foramina at C3-C4 level. Upper chest: Unremarkable. Other: Scattered arterial calcifications are seen. IMPRESSION: No recent fracture is seen in cervical spine.  Cervical spondylosis. Electronically Signed   By: Elmer Picker M.D.   On: 04/13/2022 16:05   CT Head Wo Contrast  Result Date: 04/13/2022 CLINICAL DATA:  Altered mental status, recent fall EXAM: CT HEAD WITHOUT CONTRAST TECHNIQUE: Contiguous axial images were obtained from the base of the skull through the vertex without intravenous contrast. RADIATION DOSE REDUCTION: This exam was performed according to the departmental dose-optimization program which includes automated exposure control, adjustment of the mA and/or kV according to patient size and/or use of iterative reconstruction technique. COMPARISON:  01/17/2020 FINDINGS: Brain: No acute intracranial findings are seen. There are no signs of bleeding within the cranium. Cortical sulci are prominent, especially in the left parietal cortex. These findings appear stable. This may suggest encephalomalacia. There is no focal edema or mass effect. Vascular: Unremarkable.  Skull: Unremarkable. Sinuses/Orbits: There is mild mucosal thickening in ethmoid and maxillary sinuses. There is fluid density in multiple right mastoid air cells. Similar finding was seen in the previous study. Other: None. IMPRESSION: No acute intracranial findings are seen in noncontrast CT brain. Atrophy, more severe in the left parietal cortex. No significant interval changes are noted. Electronically Signed   By: Elmer Picker M.D.   On: 04/13/2022 16:00   CUP PACEART REMOTE DEVICE CHECK  Result Date: 04/12/2022 Scheduled remote reviewed. Normal device function.  AM-VP 93.5%, VP only 6.3%, A4 2.90mS2 Next remote 91 days. AV Conduction Mode Switch 0 % LA   Microbiology: Results for orders placed or performed during the hospital encounter of 04/13/22  Urine Culture     Status: None   Collection Time: 04/13/22  5:25 PM   Specimen: Urine, Clean Catch  Result Value Ref Range Status   Specimen Description   Final    URINE, CLEAN CATCH Performed at [facility], [address] [place], [place] [address]    Special Requests   Final    NONE Performed at [facility], [address] [place], [place] [address]    Culture   Final    NO GROWTH Performed at [facility], [place] [address], [place], [place] [address]    Report Status 04/15/2022 FINAL  Final   Labs: CBC: Recent Labs  Lab 04/19/22 0424 04/22/22 0454 04/23/22 0520 04/24/22 0420 04/25/22 0411  WBC 7.4 6.1 7.1 5.8 6.5  HGB 13.0 12.0* 11.3* 11.4* 10.8*  HCT 36.1* 34.4* 32.0* 32.4* 31.1*  MCV 89.1 90.5 91.2 91.5 91.5  PLT 254 299 271 292 323   Basic Metabolic Panel: Recent Labs  Lab 04/19/22 0424 04/20/22 0415 04/22/22 0454 04/23/22 0520 04/24/22 0420 04/25/22 0411  NA 123* 124* 128* 126* 125* 125*  K 3.6 3.7 3.6 4.0 3.9 4.0  CL 91* 92* 94* 93* 94* 93*  CO2 '22 22 27 27 24 25  '$ GLUCOSE 96 103* 66* 220* 240* 235*  BUN '16 15 16 16 18 17  '$ CREATININE 0.69 0.63 0.77 0.87 0.73 0.85   CALCIUM 8.3* 8.4* 8.6* 8.2* 8.3* 8.2*  MG 2.0  --   --  2.0 1.9 1.9   Liver Function Tests: Recent Labs  Lab 04/23/22 0520 04/24/22 0420 04/25/22 0411  AST 14* 14* 14*  ALT '13 14 15  '$ ALKPHOS 47 45 38  BILITOT 0.7 0.4 0.4  PROT 5.6* 5.8* 5.5*  ALBUMIN 2.7* 2.8* 2.7*   CBG: Recent Labs  Lab 04/24/22 0803 04/24/22 1210 04/24/22 1637 04/24/22 2156 04/25/22 0817  GLUCAP 210* 257* 227* 257* 186*    Discharge time spent: greater than 30 minutes.  Signed: Berle Mull, MD [facility]

## 2022-04-27 DIAGNOSIS — E785 Hyperlipidemia, unspecified: Secondary | ICD-10-CM | POA: Diagnosis not present

## 2022-04-27 DIAGNOSIS — R296 Repeated falls: Secondary | ICD-10-CM | POA: Diagnosis not present

## 2022-04-27 DIAGNOSIS — G9341 Metabolic encephalopathy: Secondary | ICD-10-CM | POA: Diagnosis not present

## 2022-04-27 DIAGNOSIS — I2510 Atherosclerotic heart disease of native coronary artery without angina pectoris: Secondary | ICD-10-CM | POA: Diagnosis not present

## 2022-04-27 DIAGNOSIS — Z794 Long term (current) use of insulin: Secondary | ICD-10-CM | POA: Diagnosis not present

## 2022-04-27 DIAGNOSIS — E1169 Type 2 diabetes mellitus with other specified complication: Secondary | ICD-10-CM | POA: Diagnosis not present

## 2022-04-27 DIAGNOSIS — E871 Hypo-osmolality and hyponatremia: Secondary | ICD-10-CM | POA: Diagnosis not present

## 2022-04-27 DIAGNOSIS — I10 Essential (primary) hypertension: Secondary | ICD-10-CM | POA: Diagnosis not present

## 2022-04-28 DIAGNOSIS — R296 Repeated falls: Secondary | ICD-10-CM | POA: Diagnosis not present

## 2022-04-28 DIAGNOSIS — Z794 Long term (current) use of insulin: Secondary | ICD-10-CM | POA: Diagnosis not present

## 2022-04-28 DIAGNOSIS — I2510 Atherosclerotic heart disease of native coronary artery without angina pectoris: Secondary | ICD-10-CM | POA: Diagnosis not present

## 2022-04-28 DIAGNOSIS — M6281 Muscle weakness (generalized): Secondary | ICD-10-CM | POA: Diagnosis not present

## 2022-04-28 DIAGNOSIS — G9341 Metabolic encephalopathy: Secondary | ICD-10-CM | POA: Diagnosis not present

## 2022-04-28 DIAGNOSIS — R239 Unspecified skin changes: Secondary | ICD-10-CM | POA: Diagnosis not present

## 2022-04-28 DIAGNOSIS — E871 Hypo-osmolality and hyponatremia: Secondary | ICD-10-CM | POA: Diagnosis not present

## 2022-04-28 DIAGNOSIS — I10 Essential (primary) hypertension: Secondary | ICD-10-CM | POA: Diagnosis not present

## 2022-04-28 DIAGNOSIS — L89613 Pressure ulcer of right heel, stage 3: Secondary | ICD-10-CM | POA: Diagnosis not present

## 2022-04-28 DIAGNOSIS — E1169 Type 2 diabetes mellitus with other specified complication: Secondary | ICD-10-CM | POA: Diagnosis not present

## 2022-04-28 DIAGNOSIS — R69 Illness, unspecified: Secondary | ICD-10-CM | POA: Diagnosis not present

## 2022-05-01 DIAGNOSIS — Z794 Long term (current) use of insulin: Secondary | ICD-10-CM | POA: Diagnosis not present

## 2022-05-01 DIAGNOSIS — G9341 Metabolic encephalopathy: Secondary | ICD-10-CM | POA: Diagnosis not present

## 2022-05-01 DIAGNOSIS — M6281 Muscle weakness (generalized): Secondary | ICD-10-CM | POA: Diagnosis not present

## 2022-05-01 DIAGNOSIS — R296 Repeated falls: Secondary | ICD-10-CM | POA: Diagnosis not present

## 2022-05-01 DIAGNOSIS — I10 Essential (primary) hypertension: Secondary | ICD-10-CM | POA: Diagnosis not present

## 2022-05-01 DIAGNOSIS — E1169 Type 2 diabetes mellitus with other specified complication: Secondary | ICD-10-CM | POA: Diagnosis not present

## 2022-05-01 DIAGNOSIS — I2510 Atherosclerotic heart disease of native coronary artery without angina pectoris: Secondary | ICD-10-CM | POA: Diagnosis not present

## 2022-05-01 DIAGNOSIS — E871 Hypo-osmolality and hyponatremia: Secondary | ICD-10-CM | POA: Diagnosis not present

## 2022-05-03 DIAGNOSIS — E1169 Type 2 diabetes mellitus with other specified complication: Secondary | ICD-10-CM | POA: Diagnosis not present

## 2022-05-03 DIAGNOSIS — L89613 Pressure ulcer of right heel, stage 3: Secondary | ICD-10-CM | POA: Diagnosis not present

## 2022-05-03 DIAGNOSIS — M6281 Muscle weakness (generalized): Secondary | ICD-10-CM | POA: Diagnosis not present

## 2022-05-03 DIAGNOSIS — R69 Illness, unspecified: Secondary | ICD-10-CM | POA: Diagnosis not present

## 2022-05-03 DIAGNOSIS — R239 Unspecified skin changes: Secondary | ICD-10-CM | POA: Diagnosis not present

## 2022-05-04 NOTE — Progress Notes (Signed)
Remote pacemaker transmission.   

## 2022-05-10 DIAGNOSIS — R239 Unspecified skin changes: Secondary | ICD-10-CM | POA: Diagnosis not present

## 2022-05-10 DIAGNOSIS — R69 Illness, unspecified: Secondary | ICD-10-CM | POA: Diagnosis not present

## 2022-05-10 DIAGNOSIS — L89613 Pressure ulcer of right heel, stage 3: Secondary | ICD-10-CM | POA: Diagnosis not present

## 2022-05-10 DIAGNOSIS — M6281 Muscle weakness (generalized): Secondary | ICD-10-CM | POA: Diagnosis not present

## 2022-05-10 DIAGNOSIS — E1169 Type 2 diabetes mellitus with other specified complication: Secondary | ICD-10-CM | POA: Diagnosis not present

## 2022-05-12 DIAGNOSIS — H16011 Central corneal ulcer, right eye: Secondary | ICD-10-CM | POA: Diagnosis not present

## 2022-05-13 DIAGNOSIS — E1169 Type 2 diabetes mellitus with other specified complication: Secondary | ICD-10-CM | POA: Diagnosis not present

## 2022-05-13 DIAGNOSIS — R296 Repeated falls: Secondary | ICD-10-CM | POA: Diagnosis not present

## 2022-05-13 DIAGNOSIS — G9341 Metabolic encephalopathy: Secondary | ICD-10-CM | POA: Diagnosis not present

## 2022-05-13 DIAGNOSIS — I2510 Atherosclerotic heart disease of native coronary artery without angina pectoris: Secondary | ICD-10-CM | POA: Diagnosis not present

## 2022-05-13 DIAGNOSIS — M6281 Muscle weakness (generalized): Secondary | ICD-10-CM | POA: Diagnosis not present

## 2022-05-13 DIAGNOSIS — I10 Essential (primary) hypertension: Secondary | ICD-10-CM | POA: Diagnosis not present

## 2022-05-13 DIAGNOSIS — E871 Hypo-osmolality and hyponatremia: Secondary | ICD-10-CM | POA: Diagnosis not present

## 2022-05-13 DIAGNOSIS — Z794 Long term (current) use of insulin: Secondary | ICD-10-CM | POA: Diagnosis not present

## 2022-05-15 DIAGNOSIS — E871 Hypo-osmolality and hyponatremia: Secondary | ICD-10-CM | POA: Diagnosis not present

## 2022-05-15 DIAGNOSIS — G9341 Metabolic encephalopathy: Secondary | ICD-10-CM | POA: Diagnosis not present

## 2022-05-15 DIAGNOSIS — E1169 Type 2 diabetes mellitus with other specified complication: Secondary | ICD-10-CM | POA: Diagnosis not present

## 2022-05-15 DIAGNOSIS — Z794 Long term (current) use of insulin: Secondary | ICD-10-CM | POA: Diagnosis not present

## 2022-05-15 DIAGNOSIS — R296 Repeated falls: Secondary | ICD-10-CM | POA: Diagnosis not present

## 2022-05-15 DIAGNOSIS — I10 Essential (primary) hypertension: Secondary | ICD-10-CM | POA: Diagnosis not present

## 2022-05-15 DIAGNOSIS — M6281 Muscle weakness (generalized): Secondary | ICD-10-CM | POA: Diagnosis not present

## 2022-05-15 DIAGNOSIS — H16011 Central corneal ulcer, right eye: Secondary | ICD-10-CM | POA: Diagnosis not present

## 2022-05-15 DIAGNOSIS — I2510 Atherosclerotic heart disease of native coronary artery without angina pectoris: Secondary | ICD-10-CM | POA: Diagnosis not present

## 2022-05-18 DIAGNOSIS — E871 Hypo-osmolality and hyponatremia: Secondary | ICD-10-CM | POA: Diagnosis not present

## 2022-05-18 DIAGNOSIS — Z7982 Long term (current) use of aspirin: Secondary | ICD-10-CM | POA: Diagnosis not present

## 2022-05-18 DIAGNOSIS — E1165 Type 2 diabetes mellitus with hyperglycemia: Secondary | ICD-10-CM | POA: Diagnosis not present

## 2022-05-18 DIAGNOSIS — Z951 Presence of aortocoronary bypass graft: Secondary | ICD-10-CM | POA: Diagnosis not present

## 2022-05-18 DIAGNOSIS — R69 Illness, unspecified: Secondary | ICD-10-CM | POA: Diagnosis not present

## 2022-05-18 DIAGNOSIS — Z794 Long term (current) use of insulin: Secondary | ICD-10-CM | POA: Diagnosis not present

## 2022-05-18 DIAGNOSIS — Z7984 Long term (current) use of oral hypoglycemic drugs: Secondary | ICD-10-CM | POA: Diagnosis not present

## 2022-05-18 DIAGNOSIS — G319 Degenerative disease of nervous system, unspecified: Secondary | ICD-10-CM | POA: Diagnosis not present

## 2022-05-18 DIAGNOSIS — J4489 Other specified chronic obstructive pulmonary disease: Secondary | ICD-10-CM | POA: Diagnosis not present

## 2022-05-18 DIAGNOSIS — Z9181 History of falling: Secondary | ICD-10-CM | POA: Diagnosis not present

## 2022-05-18 DIAGNOSIS — I110 Hypertensive heart disease with heart failure: Secondary | ICD-10-CM | POA: Diagnosis not present

## 2022-05-18 DIAGNOSIS — G9341 Metabolic encephalopathy: Secondary | ICD-10-CM | POA: Diagnosis not present

## 2022-05-18 DIAGNOSIS — H16041 Marginal corneal ulcer, right eye: Secondary | ICD-10-CM | POA: Diagnosis not present

## 2022-05-18 DIAGNOSIS — Z950 Presence of cardiac pacemaker: Secondary | ICD-10-CM | POA: Diagnosis not present

## 2022-05-18 DIAGNOSIS — I2510 Atherosclerotic heart disease of native coronary artery without angina pectoris: Secondary | ICD-10-CM | POA: Diagnosis not present

## 2022-05-18 DIAGNOSIS — E11649 Type 2 diabetes mellitus with hypoglycemia without coma: Secondary | ICD-10-CM | POA: Diagnosis not present

## 2022-05-18 DIAGNOSIS — I5032 Chronic diastolic (congestive) heart failure: Secondary | ICD-10-CM | POA: Diagnosis not present

## 2022-05-18 DIAGNOSIS — R296 Repeated falls: Secondary | ICD-10-CM | POA: Diagnosis not present

## 2022-05-19 DIAGNOSIS — E1165 Type 2 diabetes mellitus with hyperglycemia: Secondary | ICD-10-CM | POA: Diagnosis not present

## 2022-05-19 DIAGNOSIS — I5032 Chronic diastolic (congestive) heart failure: Secondary | ICD-10-CM | POA: Diagnosis not present

## 2022-05-19 DIAGNOSIS — R69 Illness, unspecified: Secondary | ICD-10-CM | POA: Diagnosis not present

## 2022-05-19 DIAGNOSIS — Z7982 Long term (current) use of aspirin: Secondary | ICD-10-CM | POA: Diagnosis not present

## 2022-05-19 DIAGNOSIS — Z951 Presence of aortocoronary bypass graft: Secondary | ICD-10-CM | POA: Diagnosis not present

## 2022-05-19 DIAGNOSIS — I2510 Atherosclerotic heart disease of native coronary artery without angina pectoris: Secondary | ICD-10-CM | POA: Diagnosis not present

## 2022-05-19 DIAGNOSIS — J4489 Other specified chronic obstructive pulmonary disease: Secondary | ICD-10-CM | POA: Diagnosis not present

## 2022-05-19 DIAGNOSIS — I110 Hypertensive heart disease with heart failure: Secondary | ICD-10-CM | POA: Diagnosis not present

## 2022-05-19 DIAGNOSIS — G319 Degenerative disease of nervous system, unspecified: Secondary | ICD-10-CM | POA: Diagnosis not present

## 2022-05-19 DIAGNOSIS — Z950 Presence of cardiac pacemaker: Secondary | ICD-10-CM | POA: Diagnosis not present

## 2022-05-19 DIAGNOSIS — E11649 Type 2 diabetes mellitus with hypoglycemia without coma: Secondary | ICD-10-CM | POA: Diagnosis not present

## 2022-05-19 DIAGNOSIS — G9341 Metabolic encephalopathy: Secondary | ICD-10-CM | POA: Diagnosis not present

## 2022-05-19 DIAGNOSIS — E871 Hypo-osmolality and hyponatremia: Secondary | ICD-10-CM | POA: Diagnosis not present

## 2022-05-19 DIAGNOSIS — Z9181 History of falling: Secondary | ICD-10-CM | POA: Diagnosis not present

## 2022-05-19 DIAGNOSIS — Z7984 Long term (current) use of oral hypoglycemic drugs: Secondary | ICD-10-CM | POA: Diagnosis not present

## 2022-05-19 DIAGNOSIS — R296 Repeated falls: Secondary | ICD-10-CM | POA: Diagnosis not present

## 2022-05-19 DIAGNOSIS — Z794 Long term (current) use of insulin: Secondary | ICD-10-CM | POA: Diagnosis not present

## 2022-05-22 DIAGNOSIS — H16041 Marginal corneal ulcer, right eye: Secondary | ICD-10-CM | POA: Diagnosis not present

## 2022-05-23 DIAGNOSIS — R296 Repeated falls: Secondary | ICD-10-CM | POA: Diagnosis not present

## 2022-05-23 DIAGNOSIS — E11649 Type 2 diabetes mellitus with hypoglycemia without coma: Secondary | ICD-10-CM | POA: Diagnosis not present

## 2022-05-23 DIAGNOSIS — E871 Hypo-osmolality and hyponatremia: Secondary | ICD-10-CM | POA: Diagnosis not present

## 2022-05-23 DIAGNOSIS — Z951 Presence of aortocoronary bypass graft: Secondary | ICD-10-CM | POA: Diagnosis not present

## 2022-05-23 DIAGNOSIS — I2510 Atherosclerotic heart disease of native coronary artery without angina pectoris: Secondary | ICD-10-CM | POA: Diagnosis not present

## 2022-05-23 DIAGNOSIS — Z9181 History of falling: Secondary | ICD-10-CM | POA: Diagnosis not present

## 2022-05-23 DIAGNOSIS — Z7982 Long term (current) use of aspirin: Secondary | ICD-10-CM | POA: Diagnosis not present

## 2022-05-23 DIAGNOSIS — E1165 Type 2 diabetes mellitus with hyperglycemia: Secondary | ICD-10-CM | POA: Diagnosis not present

## 2022-05-23 DIAGNOSIS — I5032 Chronic diastolic (congestive) heart failure: Secondary | ICD-10-CM | POA: Diagnosis not present

## 2022-05-23 DIAGNOSIS — G9341 Metabolic encephalopathy: Secondary | ICD-10-CM | POA: Diagnosis not present

## 2022-05-23 DIAGNOSIS — I110 Hypertensive heart disease with heart failure: Secondary | ICD-10-CM | POA: Diagnosis not present

## 2022-05-23 DIAGNOSIS — Z7984 Long term (current) use of oral hypoglycemic drugs: Secondary | ICD-10-CM | POA: Diagnosis not present

## 2022-05-23 DIAGNOSIS — G319 Degenerative disease of nervous system, unspecified: Secondary | ICD-10-CM | POA: Diagnosis not present

## 2022-05-23 DIAGNOSIS — R69 Illness, unspecified: Secondary | ICD-10-CM | POA: Diagnosis not present

## 2022-05-23 DIAGNOSIS — Z950 Presence of cardiac pacemaker: Secondary | ICD-10-CM | POA: Diagnosis not present

## 2022-05-23 DIAGNOSIS — J4489 Other specified chronic obstructive pulmonary disease: Secondary | ICD-10-CM | POA: Diagnosis not present

## 2022-05-23 DIAGNOSIS — Z794 Long term (current) use of insulin: Secondary | ICD-10-CM | POA: Diagnosis not present

## 2022-05-24 DIAGNOSIS — E785 Hyperlipidemia, unspecified: Secondary | ICD-10-CM | POA: Diagnosis not present

## 2022-05-24 DIAGNOSIS — L89619 Pressure ulcer of right heel, unspecified stage: Secondary | ICD-10-CM | POA: Diagnosis not present

## 2022-05-24 DIAGNOSIS — I110 Hypertensive heart disease with heart failure: Secondary | ICD-10-CM | POA: Diagnosis not present

## 2022-05-24 DIAGNOSIS — Z950 Presence of cardiac pacemaker: Secondary | ICD-10-CM | POA: Diagnosis not present

## 2022-05-24 DIAGNOSIS — Z7982 Long term (current) use of aspirin: Secondary | ICD-10-CM | POA: Diagnosis not present

## 2022-05-24 DIAGNOSIS — R69 Illness, unspecified: Secondary | ICD-10-CM | POA: Diagnosis not present

## 2022-05-24 DIAGNOSIS — I5032 Chronic diastolic (congestive) heart failure: Secondary | ICD-10-CM | POA: Diagnosis not present

## 2022-05-24 DIAGNOSIS — J4489 Other specified chronic obstructive pulmonary disease: Secondary | ICD-10-CM | POA: Diagnosis not present

## 2022-05-24 DIAGNOSIS — I2510 Atherosclerotic heart disease of native coronary artery without angina pectoris: Secondary | ICD-10-CM | POA: Diagnosis not present

## 2022-05-24 DIAGNOSIS — R296 Repeated falls: Secondary | ICD-10-CM | POA: Diagnosis not present

## 2022-05-24 DIAGNOSIS — G9340 Encephalopathy, unspecified: Secondary | ICD-10-CM | POA: Diagnosis not present

## 2022-05-24 DIAGNOSIS — G319 Degenerative disease of nervous system, unspecified: Secondary | ICD-10-CM | POA: Diagnosis not present

## 2022-05-24 DIAGNOSIS — G9341 Metabolic encephalopathy: Secondary | ICD-10-CM | POA: Diagnosis not present

## 2022-05-24 DIAGNOSIS — E11649 Type 2 diabetes mellitus with hypoglycemia without coma: Secondary | ICD-10-CM | POA: Diagnosis not present

## 2022-05-24 DIAGNOSIS — M25552 Pain in left hip: Secondary | ICD-10-CM | POA: Diagnosis not present

## 2022-05-24 DIAGNOSIS — E1169 Type 2 diabetes mellitus with other specified complication: Secondary | ICD-10-CM | POA: Diagnosis not present

## 2022-05-24 DIAGNOSIS — Z951 Presence of aortocoronary bypass graft: Secondary | ICD-10-CM | POA: Diagnosis not present

## 2022-05-24 DIAGNOSIS — Z7984 Long term (current) use of oral hypoglycemic drugs: Secondary | ICD-10-CM | POA: Diagnosis not present

## 2022-05-24 DIAGNOSIS — Z9181 History of falling: Secondary | ICD-10-CM | POA: Diagnosis not present

## 2022-05-24 DIAGNOSIS — R5381 Other malaise: Secondary | ICD-10-CM | POA: Diagnosis not present

## 2022-05-24 DIAGNOSIS — E871 Hypo-osmolality and hyponatremia: Secondary | ICD-10-CM | POA: Diagnosis not present

## 2022-05-24 DIAGNOSIS — R413 Other amnesia: Secondary | ICD-10-CM | POA: Diagnosis not present

## 2022-05-24 DIAGNOSIS — Z794 Long term (current) use of insulin: Secondary | ICD-10-CM | POA: Diagnosis not present

## 2022-05-24 DIAGNOSIS — E1165 Type 2 diabetes mellitus with hyperglycemia: Secondary | ICD-10-CM | POA: Diagnosis not present

## 2022-05-24 DIAGNOSIS — I10 Essential (primary) hypertension: Secondary | ICD-10-CM | POA: Diagnosis not present

## 2022-05-25 DIAGNOSIS — Z951 Presence of aortocoronary bypass graft: Secondary | ICD-10-CM | POA: Diagnosis not present

## 2022-05-25 DIAGNOSIS — H16041 Marginal corneal ulcer, right eye: Secondary | ICD-10-CM | POA: Diagnosis not present

## 2022-05-25 DIAGNOSIS — E871 Hypo-osmolality and hyponatremia: Secondary | ICD-10-CM | POA: Diagnosis not present

## 2022-05-25 DIAGNOSIS — R296 Repeated falls: Secondary | ICD-10-CM | POA: Diagnosis not present

## 2022-05-25 DIAGNOSIS — J4489 Other specified chronic obstructive pulmonary disease: Secondary | ICD-10-CM | POA: Diagnosis not present

## 2022-05-25 DIAGNOSIS — Z7984 Long term (current) use of oral hypoglycemic drugs: Secondary | ICD-10-CM | POA: Diagnosis not present

## 2022-05-25 DIAGNOSIS — Z950 Presence of cardiac pacemaker: Secondary | ICD-10-CM | POA: Diagnosis not present

## 2022-05-25 DIAGNOSIS — I2510 Atherosclerotic heart disease of native coronary artery without angina pectoris: Secondary | ICD-10-CM | POA: Diagnosis not present

## 2022-05-25 DIAGNOSIS — G319 Degenerative disease of nervous system, unspecified: Secondary | ICD-10-CM | POA: Diagnosis not present

## 2022-05-25 DIAGNOSIS — R69 Illness, unspecified: Secondary | ICD-10-CM | POA: Diagnosis not present

## 2022-05-25 DIAGNOSIS — Z794 Long term (current) use of insulin: Secondary | ICD-10-CM | POA: Diagnosis not present

## 2022-05-25 DIAGNOSIS — Z7982 Long term (current) use of aspirin: Secondary | ICD-10-CM | POA: Diagnosis not present

## 2022-05-25 DIAGNOSIS — E11649 Type 2 diabetes mellitus with hypoglycemia without coma: Secondary | ICD-10-CM | POA: Diagnosis not present

## 2022-05-25 DIAGNOSIS — G9341 Metabolic encephalopathy: Secondary | ICD-10-CM | POA: Diagnosis not present

## 2022-05-25 DIAGNOSIS — I110 Hypertensive heart disease with heart failure: Secondary | ICD-10-CM | POA: Diagnosis not present

## 2022-05-25 DIAGNOSIS — E1165 Type 2 diabetes mellitus with hyperglycemia: Secondary | ICD-10-CM | POA: Diagnosis not present

## 2022-05-25 DIAGNOSIS — Z9181 History of falling: Secondary | ICD-10-CM | POA: Diagnosis not present

## 2022-05-25 DIAGNOSIS — I5032 Chronic diastolic (congestive) heart failure: Secondary | ICD-10-CM | POA: Diagnosis not present

## 2022-05-26 DIAGNOSIS — R296 Repeated falls: Secondary | ICD-10-CM | POA: Diagnosis not present

## 2022-05-26 DIAGNOSIS — Z7982 Long term (current) use of aspirin: Secondary | ICD-10-CM | POA: Diagnosis not present

## 2022-05-26 DIAGNOSIS — R69 Illness, unspecified: Secondary | ICD-10-CM | POA: Diagnosis not present

## 2022-05-26 DIAGNOSIS — G9341 Metabolic encephalopathy: Secondary | ICD-10-CM | POA: Diagnosis not present

## 2022-05-26 DIAGNOSIS — J4489 Other specified chronic obstructive pulmonary disease: Secondary | ICD-10-CM | POA: Diagnosis not present

## 2022-05-26 DIAGNOSIS — Z7984 Long term (current) use of oral hypoglycemic drugs: Secondary | ICD-10-CM | POA: Diagnosis not present

## 2022-05-26 DIAGNOSIS — Z950 Presence of cardiac pacemaker: Secondary | ICD-10-CM | POA: Diagnosis not present

## 2022-05-26 DIAGNOSIS — I110 Hypertensive heart disease with heart failure: Secondary | ICD-10-CM | POA: Diagnosis not present

## 2022-05-26 DIAGNOSIS — E871 Hypo-osmolality and hyponatremia: Secondary | ICD-10-CM | POA: Diagnosis not present

## 2022-05-26 DIAGNOSIS — Z951 Presence of aortocoronary bypass graft: Secondary | ICD-10-CM | POA: Diagnosis not present

## 2022-05-26 DIAGNOSIS — I2510 Atherosclerotic heart disease of native coronary artery without angina pectoris: Secondary | ICD-10-CM | POA: Diagnosis not present

## 2022-05-26 DIAGNOSIS — E1165 Type 2 diabetes mellitus with hyperglycemia: Secondary | ICD-10-CM | POA: Diagnosis not present

## 2022-05-26 DIAGNOSIS — G319 Degenerative disease of nervous system, unspecified: Secondary | ICD-10-CM | POA: Diagnosis not present

## 2022-05-26 DIAGNOSIS — I5032 Chronic diastolic (congestive) heart failure: Secondary | ICD-10-CM | POA: Diagnosis not present

## 2022-05-26 DIAGNOSIS — Z9181 History of falling: Secondary | ICD-10-CM | POA: Diagnosis not present

## 2022-05-26 DIAGNOSIS — E11649 Type 2 diabetes mellitus with hypoglycemia without coma: Secondary | ICD-10-CM | POA: Diagnosis not present

## 2022-05-26 DIAGNOSIS — Z794 Long term (current) use of insulin: Secondary | ICD-10-CM | POA: Diagnosis not present

## 2022-05-30 DIAGNOSIS — E1165 Type 2 diabetes mellitus with hyperglycemia: Secondary | ICD-10-CM | POA: Diagnosis not present

## 2022-05-30 DIAGNOSIS — Z951 Presence of aortocoronary bypass graft: Secondary | ICD-10-CM | POA: Diagnosis not present

## 2022-05-30 DIAGNOSIS — I110 Hypertensive heart disease with heart failure: Secondary | ICD-10-CM | POA: Diagnosis not present

## 2022-05-30 DIAGNOSIS — Z9181 History of falling: Secondary | ICD-10-CM | POA: Diagnosis not present

## 2022-05-30 DIAGNOSIS — Z794 Long term (current) use of insulin: Secondary | ICD-10-CM | POA: Diagnosis not present

## 2022-05-30 DIAGNOSIS — R69 Illness, unspecified: Secondary | ICD-10-CM | POA: Diagnosis not present

## 2022-05-30 DIAGNOSIS — Z7982 Long term (current) use of aspirin: Secondary | ICD-10-CM | POA: Diagnosis not present

## 2022-05-30 DIAGNOSIS — Z950 Presence of cardiac pacemaker: Secondary | ICD-10-CM | POA: Diagnosis not present

## 2022-05-30 DIAGNOSIS — G9341 Metabolic encephalopathy: Secondary | ICD-10-CM | POA: Diagnosis not present

## 2022-05-30 DIAGNOSIS — I2510 Atherosclerotic heart disease of native coronary artery without angina pectoris: Secondary | ICD-10-CM | POA: Diagnosis not present

## 2022-05-30 DIAGNOSIS — Z7984 Long term (current) use of oral hypoglycemic drugs: Secondary | ICD-10-CM | POA: Diagnosis not present

## 2022-05-30 DIAGNOSIS — R296 Repeated falls: Secondary | ICD-10-CM | POA: Diagnosis not present

## 2022-05-30 DIAGNOSIS — J4489 Other specified chronic obstructive pulmonary disease: Secondary | ICD-10-CM | POA: Diagnosis not present

## 2022-05-30 DIAGNOSIS — H16011 Central corneal ulcer, right eye: Secondary | ICD-10-CM | POA: Diagnosis not present

## 2022-05-30 DIAGNOSIS — E871 Hypo-osmolality and hyponatremia: Secondary | ICD-10-CM | POA: Diagnosis not present

## 2022-05-30 DIAGNOSIS — E11649 Type 2 diabetes mellitus with hypoglycemia without coma: Secondary | ICD-10-CM | POA: Diagnosis not present

## 2022-05-30 DIAGNOSIS — I5032 Chronic diastolic (congestive) heart failure: Secondary | ICD-10-CM | POA: Diagnosis not present

## 2022-05-30 DIAGNOSIS — G319 Degenerative disease of nervous system, unspecified: Secondary | ICD-10-CM | POA: Diagnosis not present

## 2022-05-31 DIAGNOSIS — I2510 Atherosclerotic heart disease of native coronary artery without angina pectoris: Secondary | ICD-10-CM | POA: Diagnosis not present

## 2022-05-31 DIAGNOSIS — E1165 Type 2 diabetes mellitus with hyperglycemia: Secondary | ICD-10-CM | POA: Diagnosis not present

## 2022-05-31 DIAGNOSIS — R69 Illness, unspecified: Secondary | ICD-10-CM | POA: Diagnosis not present

## 2022-05-31 DIAGNOSIS — Z950 Presence of cardiac pacemaker: Secondary | ICD-10-CM | POA: Diagnosis not present

## 2022-05-31 DIAGNOSIS — G319 Degenerative disease of nervous system, unspecified: Secondary | ICD-10-CM | POA: Diagnosis not present

## 2022-05-31 DIAGNOSIS — G9341 Metabolic encephalopathy: Secondary | ICD-10-CM | POA: Diagnosis not present

## 2022-05-31 DIAGNOSIS — Z951 Presence of aortocoronary bypass graft: Secondary | ICD-10-CM | POA: Diagnosis not present

## 2022-05-31 DIAGNOSIS — Z7984 Long term (current) use of oral hypoglycemic drugs: Secondary | ICD-10-CM | POA: Diagnosis not present

## 2022-05-31 DIAGNOSIS — Z7982 Long term (current) use of aspirin: Secondary | ICD-10-CM | POA: Diagnosis not present

## 2022-05-31 DIAGNOSIS — Z9181 History of falling: Secondary | ICD-10-CM | POA: Diagnosis not present

## 2022-05-31 DIAGNOSIS — I110 Hypertensive heart disease with heart failure: Secondary | ICD-10-CM | POA: Diagnosis not present

## 2022-05-31 DIAGNOSIS — I5032 Chronic diastolic (congestive) heart failure: Secondary | ICD-10-CM | POA: Diagnosis not present

## 2022-05-31 DIAGNOSIS — J4489 Other specified chronic obstructive pulmonary disease: Secondary | ICD-10-CM | POA: Diagnosis not present

## 2022-05-31 DIAGNOSIS — R296 Repeated falls: Secondary | ICD-10-CM | POA: Diagnosis not present

## 2022-05-31 DIAGNOSIS — E871 Hypo-osmolality and hyponatremia: Secondary | ICD-10-CM | POA: Diagnosis not present

## 2022-05-31 DIAGNOSIS — Z794 Long term (current) use of insulin: Secondary | ICD-10-CM | POA: Diagnosis not present

## 2022-05-31 DIAGNOSIS — E11649 Type 2 diabetes mellitus with hypoglycemia without coma: Secondary | ICD-10-CM | POA: Diagnosis not present

## 2022-06-01 DIAGNOSIS — G319 Degenerative disease of nervous system, unspecified: Secondary | ICD-10-CM | POA: Diagnosis not present

## 2022-06-01 DIAGNOSIS — I2510 Atherosclerotic heart disease of native coronary artery without angina pectoris: Secondary | ICD-10-CM | POA: Diagnosis not present

## 2022-06-01 DIAGNOSIS — E11649 Type 2 diabetes mellitus with hypoglycemia without coma: Secondary | ICD-10-CM | POA: Diagnosis not present

## 2022-06-01 DIAGNOSIS — R69 Illness, unspecified: Secondary | ICD-10-CM | POA: Diagnosis not present

## 2022-06-01 DIAGNOSIS — Z7984 Long term (current) use of oral hypoglycemic drugs: Secondary | ICD-10-CM | POA: Diagnosis not present

## 2022-06-01 DIAGNOSIS — Z9181 History of falling: Secondary | ICD-10-CM | POA: Diagnosis not present

## 2022-06-01 DIAGNOSIS — G9341 Metabolic encephalopathy: Secondary | ICD-10-CM | POA: Diagnosis not present

## 2022-06-01 DIAGNOSIS — Z951 Presence of aortocoronary bypass graft: Secondary | ICD-10-CM | POA: Diagnosis not present

## 2022-06-01 DIAGNOSIS — E871 Hypo-osmolality and hyponatremia: Secondary | ICD-10-CM | POA: Diagnosis not present

## 2022-06-01 DIAGNOSIS — J4489 Other specified chronic obstructive pulmonary disease: Secondary | ICD-10-CM | POA: Diagnosis not present

## 2022-06-01 DIAGNOSIS — Z7982 Long term (current) use of aspirin: Secondary | ICD-10-CM | POA: Diagnosis not present

## 2022-06-01 DIAGNOSIS — I5032 Chronic diastolic (congestive) heart failure: Secondary | ICD-10-CM | POA: Diagnosis not present

## 2022-06-01 DIAGNOSIS — E1165 Type 2 diabetes mellitus with hyperglycemia: Secondary | ICD-10-CM | POA: Diagnosis not present

## 2022-06-01 DIAGNOSIS — I110 Hypertensive heart disease with heart failure: Secondary | ICD-10-CM | POA: Diagnosis not present

## 2022-06-01 DIAGNOSIS — Z794 Long term (current) use of insulin: Secondary | ICD-10-CM | POA: Diagnosis not present

## 2022-06-01 DIAGNOSIS — Z950 Presence of cardiac pacemaker: Secondary | ICD-10-CM | POA: Diagnosis not present

## 2022-06-01 DIAGNOSIS — R296 Repeated falls: Secondary | ICD-10-CM | POA: Diagnosis not present

## 2022-06-02 DIAGNOSIS — H16011 Central corneal ulcer, right eye: Secondary | ICD-10-CM | POA: Diagnosis not present

## 2022-06-06 DIAGNOSIS — Z950 Presence of cardiac pacemaker: Secondary | ICD-10-CM | POA: Diagnosis not present

## 2022-06-06 DIAGNOSIS — Z951 Presence of aortocoronary bypass graft: Secondary | ICD-10-CM | POA: Diagnosis not present

## 2022-06-06 DIAGNOSIS — E1165 Type 2 diabetes mellitus with hyperglycemia: Secondary | ICD-10-CM | POA: Diagnosis not present

## 2022-06-06 DIAGNOSIS — R69 Illness, unspecified: Secondary | ICD-10-CM | POA: Diagnosis not present

## 2022-06-06 DIAGNOSIS — I2510 Atherosclerotic heart disease of native coronary artery without angina pectoris: Secondary | ICD-10-CM | POA: Diagnosis not present

## 2022-06-06 DIAGNOSIS — Z794 Long term (current) use of insulin: Secondary | ICD-10-CM | POA: Diagnosis not present

## 2022-06-06 DIAGNOSIS — Z7982 Long term (current) use of aspirin: Secondary | ICD-10-CM | POA: Diagnosis not present

## 2022-06-06 DIAGNOSIS — Z7984 Long term (current) use of oral hypoglycemic drugs: Secondary | ICD-10-CM | POA: Diagnosis not present

## 2022-06-06 DIAGNOSIS — I5032 Chronic diastolic (congestive) heart failure: Secondary | ICD-10-CM | POA: Diagnosis not present

## 2022-06-06 DIAGNOSIS — Z9181 History of falling: Secondary | ICD-10-CM | POA: Diagnosis not present

## 2022-06-06 DIAGNOSIS — E11649 Type 2 diabetes mellitus with hypoglycemia without coma: Secondary | ICD-10-CM | POA: Diagnosis not present

## 2022-06-06 DIAGNOSIS — G319 Degenerative disease of nervous system, unspecified: Secondary | ICD-10-CM | POA: Diagnosis not present

## 2022-06-06 DIAGNOSIS — J4489 Other specified chronic obstructive pulmonary disease: Secondary | ICD-10-CM | POA: Diagnosis not present

## 2022-06-06 DIAGNOSIS — R296 Repeated falls: Secondary | ICD-10-CM | POA: Diagnosis not present

## 2022-06-06 DIAGNOSIS — H16011 Central corneal ulcer, right eye: Secondary | ICD-10-CM | POA: Diagnosis not present

## 2022-06-06 DIAGNOSIS — E871 Hypo-osmolality and hyponatremia: Secondary | ICD-10-CM | POA: Diagnosis not present

## 2022-06-06 DIAGNOSIS — G9341 Metabolic encephalopathy: Secondary | ICD-10-CM | POA: Diagnosis not present

## 2022-06-06 DIAGNOSIS — I110 Hypertensive heart disease with heart failure: Secondary | ICD-10-CM | POA: Diagnosis not present

## 2022-06-07 DIAGNOSIS — Z794 Long term (current) use of insulin: Secondary | ICD-10-CM | POA: Diagnosis not present

## 2022-06-07 DIAGNOSIS — E1165 Type 2 diabetes mellitus with hyperglycemia: Secondary | ICD-10-CM | POA: Diagnosis not present

## 2022-06-07 DIAGNOSIS — I110 Hypertensive heart disease with heart failure: Secondary | ICD-10-CM | POA: Diagnosis not present

## 2022-06-07 DIAGNOSIS — G319 Degenerative disease of nervous system, unspecified: Secondary | ICD-10-CM | POA: Diagnosis not present

## 2022-06-07 DIAGNOSIS — Z951 Presence of aortocoronary bypass graft: Secondary | ICD-10-CM | POA: Diagnosis not present

## 2022-06-07 DIAGNOSIS — J4489 Other specified chronic obstructive pulmonary disease: Secondary | ICD-10-CM | POA: Diagnosis not present

## 2022-06-07 DIAGNOSIS — Z950 Presence of cardiac pacemaker: Secondary | ICD-10-CM | POA: Diagnosis not present

## 2022-06-07 DIAGNOSIS — E11649 Type 2 diabetes mellitus with hypoglycemia without coma: Secondary | ICD-10-CM | POA: Diagnosis not present

## 2022-06-07 DIAGNOSIS — Z9181 History of falling: Secondary | ICD-10-CM | POA: Diagnosis not present

## 2022-06-07 DIAGNOSIS — Z7984 Long term (current) use of oral hypoglycemic drugs: Secondary | ICD-10-CM | POA: Diagnosis not present

## 2022-06-07 DIAGNOSIS — Z7982 Long term (current) use of aspirin: Secondary | ICD-10-CM | POA: Diagnosis not present

## 2022-06-07 DIAGNOSIS — I5032 Chronic diastolic (congestive) heart failure: Secondary | ICD-10-CM | POA: Diagnosis not present

## 2022-06-07 DIAGNOSIS — R296 Repeated falls: Secondary | ICD-10-CM | POA: Diagnosis not present

## 2022-06-07 DIAGNOSIS — I2510 Atherosclerotic heart disease of native coronary artery without angina pectoris: Secondary | ICD-10-CM | POA: Diagnosis not present

## 2022-06-07 DIAGNOSIS — G9341 Metabolic encephalopathy: Secondary | ICD-10-CM | POA: Diagnosis not present

## 2022-06-07 DIAGNOSIS — R69 Illness, unspecified: Secondary | ICD-10-CM | POA: Diagnosis not present

## 2022-06-07 DIAGNOSIS — E871 Hypo-osmolality and hyponatremia: Secondary | ICD-10-CM | POA: Diagnosis not present

## 2022-06-08 DIAGNOSIS — R296 Repeated falls: Secondary | ICD-10-CM | POA: Diagnosis not present

## 2022-06-08 DIAGNOSIS — R69 Illness, unspecified: Secondary | ICD-10-CM | POA: Diagnosis not present

## 2022-06-08 DIAGNOSIS — Z794 Long term (current) use of insulin: Secondary | ICD-10-CM | POA: Diagnosis not present

## 2022-06-08 DIAGNOSIS — I2510 Atherosclerotic heart disease of native coronary artery without angina pectoris: Secondary | ICD-10-CM | POA: Diagnosis not present

## 2022-06-08 DIAGNOSIS — J4489 Other specified chronic obstructive pulmonary disease: Secondary | ICD-10-CM | POA: Diagnosis not present

## 2022-06-08 DIAGNOSIS — E1165 Type 2 diabetes mellitus with hyperglycemia: Secondary | ICD-10-CM | POA: Diagnosis not present

## 2022-06-08 DIAGNOSIS — E871 Hypo-osmolality and hyponatremia: Secondary | ICD-10-CM | POA: Diagnosis not present

## 2022-06-08 DIAGNOSIS — I110 Hypertensive heart disease with heart failure: Secondary | ICD-10-CM | POA: Diagnosis not present

## 2022-06-08 DIAGNOSIS — E11649 Type 2 diabetes mellitus with hypoglycemia without coma: Secondary | ICD-10-CM | POA: Diagnosis not present

## 2022-06-08 DIAGNOSIS — G319 Degenerative disease of nervous system, unspecified: Secondary | ICD-10-CM | POA: Diagnosis not present

## 2022-06-08 DIAGNOSIS — G9341 Metabolic encephalopathy: Secondary | ICD-10-CM | POA: Diagnosis not present

## 2022-06-08 DIAGNOSIS — I5032 Chronic diastolic (congestive) heart failure: Secondary | ICD-10-CM | POA: Diagnosis not present

## 2022-06-09 DIAGNOSIS — E871 Hypo-osmolality and hyponatremia: Secondary | ICD-10-CM | POA: Diagnosis not present

## 2022-06-09 DIAGNOSIS — G319 Degenerative disease of nervous system, unspecified: Secondary | ICD-10-CM | POA: Diagnosis not present

## 2022-06-09 DIAGNOSIS — E1165 Type 2 diabetes mellitus with hyperglycemia: Secondary | ICD-10-CM | POA: Diagnosis not present

## 2022-06-09 DIAGNOSIS — Z9181 History of falling: Secondary | ICD-10-CM | POA: Diagnosis not present

## 2022-06-09 DIAGNOSIS — R296 Repeated falls: Secondary | ICD-10-CM | POA: Diagnosis not present

## 2022-06-09 DIAGNOSIS — Z794 Long term (current) use of insulin: Secondary | ICD-10-CM | POA: Diagnosis not present

## 2022-06-09 DIAGNOSIS — J4489 Other specified chronic obstructive pulmonary disease: Secondary | ICD-10-CM | POA: Diagnosis not present

## 2022-06-09 DIAGNOSIS — I5032 Chronic diastolic (congestive) heart failure: Secondary | ICD-10-CM | POA: Diagnosis not present

## 2022-06-09 DIAGNOSIS — Z951 Presence of aortocoronary bypass graft: Secondary | ICD-10-CM | POA: Diagnosis not present

## 2022-06-09 DIAGNOSIS — E11649 Type 2 diabetes mellitus with hypoglycemia without coma: Secondary | ICD-10-CM | POA: Diagnosis not present

## 2022-06-09 DIAGNOSIS — Z7984 Long term (current) use of oral hypoglycemic drugs: Secondary | ICD-10-CM | POA: Diagnosis not present

## 2022-06-09 DIAGNOSIS — I2510 Atherosclerotic heart disease of native coronary artery without angina pectoris: Secondary | ICD-10-CM | POA: Diagnosis not present

## 2022-06-09 DIAGNOSIS — I110 Hypertensive heart disease with heart failure: Secondary | ICD-10-CM | POA: Diagnosis not present

## 2022-06-09 DIAGNOSIS — Z7982 Long term (current) use of aspirin: Secondary | ICD-10-CM | POA: Diagnosis not present

## 2022-06-09 DIAGNOSIS — G9341 Metabolic encephalopathy: Secondary | ICD-10-CM | POA: Diagnosis not present

## 2022-06-09 DIAGNOSIS — R69 Illness, unspecified: Secondary | ICD-10-CM | POA: Diagnosis not present

## 2022-06-09 DIAGNOSIS — Z950 Presence of cardiac pacemaker: Secondary | ICD-10-CM | POA: Diagnosis not present

## 2022-06-12 DIAGNOSIS — J4489 Other specified chronic obstructive pulmonary disease: Secondary | ICD-10-CM | POA: Diagnosis not present

## 2022-06-12 DIAGNOSIS — Z7982 Long term (current) use of aspirin: Secondary | ICD-10-CM | POA: Diagnosis not present

## 2022-06-12 DIAGNOSIS — E1169 Type 2 diabetes mellitus with other specified complication: Secondary | ICD-10-CM | POA: Diagnosis not present

## 2022-06-12 DIAGNOSIS — I5032 Chronic diastolic (congestive) heart failure: Secondary | ICD-10-CM | POA: Diagnosis not present

## 2022-06-12 DIAGNOSIS — I2510 Atherosclerotic heart disease of native coronary artery without angina pectoris: Secondary | ICD-10-CM | POA: Diagnosis not present

## 2022-06-12 DIAGNOSIS — G9341 Metabolic encephalopathy: Secondary | ICD-10-CM | POA: Diagnosis not present

## 2022-06-12 DIAGNOSIS — Z950 Presence of cardiac pacemaker: Secondary | ICD-10-CM | POA: Diagnosis not present

## 2022-06-12 DIAGNOSIS — R69 Illness, unspecified: Secondary | ICD-10-CM | POA: Diagnosis not present

## 2022-06-12 DIAGNOSIS — I110 Hypertensive heart disease with heart failure: Secondary | ICD-10-CM | POA: Diagnosis not present

## 2022-06-12 DIAGNOSIS — E1165 Type 2 diabetes mellitus with hyperglycemia: Secondary | ICD-10-CM | POA: Diagnosis not present

## 2022-06-12 DIAGNOSIS — R296 Repeated falls: Secondary | ICD-10-CM | POA: Diagnosis not present

## 2022-06-12 DIAGNOSIS — R2681 Unsteadiness on feet: Secondary | ICD-10-CM | POA: Diagnosis not present

## 2022-06-12 DIAGNOSIS — Z794 Long term (current) use of insulin: Secondary | ICD-10-CM | POA: Diagnosis not present

## 2022-06-12 DIAGNOSIS — E871 Hypo-osmolality and hyponatremia: Secondary | ICD-10-CM | POA: Diagnosis not present

## 2022-06-12 DIAGNOSIS — I10 Essential (primary) hypertension: Secondary | ICD-10-CM | POA: Diagnosis not present

## 2022-06-12 DIAGNOSIS — H16011 Central corneal ulcer, right eye: Secondary | ICD-10-CM | POA: Diagnosis not present

## 2022-06-12 DIAGNOSIS — G319 Degenerative disease of nervous system, unspecified: Secondary | ICD-10-CM | POA: Diagnosis not present

## 2022-06-12 DIAGNOSIS — Z9181 History of falling: Secondary | ICD-10-CM | POA: Diagnosis not present

## 2022-06-12 DIAGNOSIS — J449 Chronic obstructive pulmonary disease, unspecified: Secondary | ICD-10-CM | POA: Diagnosis not present

## 2022-06-12 DIAGNOSIS — F0390 Unspecified dementia without behavioral disturbance: Secondary | ICD-10-CM | POA: Diagnosis not present

## 2022-06-12 DIAGNOSIS — Z7984 Long term (current) use of oral hypoglycemic drugs: Secondary | ICD-10-CM | POA: Diagnosis not present

## 2022-06-12 DIAGNOSIS — E11649 Type 2 diabetes mellitus with hypoglycemia without coma: Secondary | ICD-10-CM | POA: Diagnosis not present

## 2022-06-12 DIAGNOSIS — Z951 Presence of aortocoronary bypass graft: Secondary | ICD-10-CM | POA: Diagnosis not present

## 2022-06-13 DIAGNOSIS — R269 Unspecified abnormalities of gait and mobility: Secondary | ICD-10-CM | POA: Diagnosis not present

## 2022-06-14 DIAGNOSIS — Z9181 History of falling: Secondary | ICD-10-CM | POA: Diagnosis not present

## 2022-06-14 DIAGNOSIS — E1165 Type 2 diabetes mellitus with hyperglycemia: Secondary | ICD-10-CM | POA: Diagnosis not present

## 2022-06-14 DIAGNOSIS — R296 Repeated falls: Secondary | ICD-10-CM | POA: Diagnosis not present

## 2022-06-14 DIAGNOSIS — I2510 Atherosclerotic heart disease of native coronary artery without angina pectoris: Secondary | ICD-10-CM | POA: Diagnosis not present

## 2022-06-14 DIAGNOSIS — Z7984 Long term (current) use of oral hypoglycemic drugs: Secondary | ICD-10-CM | POA: Diagnosis not present

## 2022-06-14 DIAGNOSIS — I5032 Chronic diastolic (congestive) heart failure: Secondary | ICD-10-CM | POA: Diagnosis not present

## 2022-06-14 DIAGNOSIS — Z794 Long term (current) use of insulin: Secondary | ICD-10-CM | POA: Diagnosis not present

## 2022-06-14 DIAGNOSIS — E871 Hypo-osmolality and hyponatremia: Secondary | ICD-10-CM | POA: Diagnosis not present

## 2022-06-14 DIAGNOSIS — J4489 Other specified chronic obstructive pulmonary disease: Secondary | ICD-10-CM | POA: Diagnosis not present

## 2022-06-14 DIAGNOSIS — E11649 Type 2 diabetes mellitus with hypoglycemia without coma: Secondary | ICD-10-CM | POA: Diagnosis not present

## 2022-06-14 DIAGNOSIS — I110 Hypertensive heart disease with heart failure: Secondary | ICD-10-CM | POA: Diagnosis not present

## 2022-06-14 DIAGNOSIS — Z950 Presence of cardiac pacemaker: Secondary | ICD-10-CM | POA: Diagnosis not present

## 2022-06-14 DIAGNOSIS — R69 Illness, unspecified: Secondary | ICD-10-CM | POA: Diagnosis not present

## 2022-06-14 DIAGNOSIS — Z951 Presence of aortocoronary bypass graft: Secondary | ICD-10-CM | POA: Diagnosis not present

## 2022-06-14 DIAGNOSIS — G9341 Metabolic encephalopathy: Secondary | ICD-10-CM | POA: Diagnosis not present

## 2022-06-14 DIAGNOSIS — Z7982 Long term (current) use of aspirin: Secondary | ICD-10-CM | POA: Diagnosis not present

## 2022-06-14 DIAGNOSIS — G319 Degenerative disease of nervous system, unspecified: Secondary | ICD-10-CM | POA: Diagnosis not present

## 2022-06-15 DIAGNOSIS — I110 Hypertensive heart disease with heart failure: Secondary | ICD-10-CM | POA: Diagnosis not present

## 2022-06-15 DIAGNOSIS — Z794 Long term (current) use of insulin: Secondary | ICD-10-CM | POA: Diagnosis not present

## 2022-06-15 DIAGNOSIS — R296 Repeated falls: Secondary | ICD-10-CM | POA: Diagnosis not present

## 2022-06-15 DIAGNOSIS — J4489 Other specified chronic obstructive pulmonary disease: Secondary | ICD-10-CM | POA: Diagnosis not present

## 2022-06-15 DIAGNOSIS — Z951 Presence of aortocoronary bypass graft: Secondary | ICD-10-CM | POA: Diagnosis not present

## 2022-06-15 DIAGNOSIS — Z7984 Long term (current) use of oral hypoglycemic drugs: Secondary | ICD-10-CM | POA: Diagnosis not present

## 2022-06-15 DIAGNOSIS — Z9181 History of falling: Secondary | ICD-10-CM | POA: Diagnosis not present

## 2022-06-15 DIAGNOSIS — Z7982 Long term (current) use of aspirin: Secondary | ICD-10-CM | POA: Diagnosis not present

## 2022-06-15 DIAGNOSIS — R69 Illness, unspecified: Secondary | ICD-10-CM | POA: Diagnosis not present

## 2022-06-15 DIAGNOSIS — G319 Degenerative disease of nervous system, unspecified: Secondary | ICD-10-CM | POA: Diagnosis not present

## 2022-06-15 DIAGNOSIS — Z950 Presence of cardiac pacemaker: Secondary | ICD-10-CM | POA: Diagnosis not present

## 2022-06-15 DIAGNOSIS — E11649 Type 2 diabetes mellitus with hypoglycemia without coma: Secondary | ICD-10-CM | POA: Diagnosis not present

## 2022-06-15 DIAGNOSIS — I2510 Atherosclerotic heart disease of native coronary artery without angina pectoris: Secondary | ICD-10-CM | POA: Diagnosis not present

## 2022-06-15 DIAGNOSIS — G9341 Metabolic encephalopathy: Secondary | ICD-10-CM | POA: Diagnosis not present

## 2022-06-15 DIAGNOSIS — E1165 Type 2 diabetes mellitus with hyperglycemia: Secondary | ICD-10-CM | POA: Diagnosis not present

## 2022-06-15 DIAGNOSIS — I5032 Chronic diastolic (congestive) heart failure: Secondary | ICD-10-CM | POA: Diagnosis not present

## 2022-06-15 DIAGNOSIS — E871 Hypo-osmolality and hyponatremia: Secondary | ICD-10-CM | POA: Diagnosis not present

## 2022-06-16 DIAGNOSIS — H16011 Central corneal ulcer, right eye: Secondary | ICD-10-CM | POA: Diagnosis not present

## 2022-06-19 DIAGNOSIS — I110 Hypertensive heart disease with heart failure: Secondary | ICD-10-CM | POA: Diagnosis not present

## 2022-06-19 DIAGNOSIS — Z7982 Long term (current) use of aspirin: Secondary | ICD-10-CM | POA: Diagnosis not present

## 2022-06-19 DIAGNOSIS — Z7984 Long term (current) use of oral hypoglycemic drugs: Secondary | ICD-10-CM | POA: Diagnosis not present

## 2022-06-19 DIAGNOSIS — R69 Illness, unspecified: Secondary | ICD-10-CM | POA: Diagnosis not present

## 2022-06-19 DIAGNOSIS — Z794 Long term (current) use of insulin: Secondary | ICD-10-CM | POA: Diagnosis not present

## 2022-06-19 DIAGNOSIS — E871 Hypo-osmolality and hyponatremia: Secondary | ICD-10-CM | POA: Diagnosis not present

## 2022-06-19 DIAGNOSIS — Z9181 History of falling: Secondary | ICD-10-CM | POA: Diagnosis not present

## 2022-06-19 DIAGNOSIS — E11649 Type 2 diabetes mellitus with hypoglycemia without coma: Secondary | ICD-10-CM | POA: Diagnosis not present

## 2022-06-19 DIAGNOSIS — E1165 Type 2 diabetes mellitus with hyperglycemia: Secondary | ICD-10-CM | POA: Diagnosis not present

## 2022-06-19 DIAGNOSIS — Z951 Presence of aortocoronary bypass graft: Secondary | ICD-10-CM | POA: Diagnosis not present

## 2022-06-19 DIAGNOSIS — R296 Repeated falls: Secondary | ICD-10-CM | POA: Diagnosis not present

## 2022-06-19 DIAGNOSIS — J4489 Other specified chronic obstructive pulmonary disease: Secondary | ICD-10-CM | POA: Diagnosis not present

## 2022-06-19 DIAGNOSIS — Z950 Presence of cardiac pacemaker: Secondary | ICD-10-CM | POA: Diagnosis not present

## 2022-06-19 DIAGNOSIS — G9341 Metabolic encephalopathy: Secondary | ICD-10-CM | POA: Diagnosis not present

## 2022-06-19 DIAGNOSIS — I2510 Atherosclerotic heart disease of native coronary artery without angina pectoris: Secondary | ICD-10-CM | POA: Diagnosis not present

## 2022-06-19 DIAGNOSIS — G319 Degenerative disease of nervous system, unspecified: Secondary | ICD-10-CM | POA: Diagnosis not present

## 2022-06-19 DIAGNOSIS — I5032 Chronic diastolic (congestive) heart failure: Secondary | ICD-10-CM | POA: Diagnosis not present

## 2022-06-21 DIAGNOSIS — G319 Degenerative disease of nervous system, unspecified: Secondary | ICD-10-CM | POA: Diagnosis not present

## 2022-06-21 DIAGNOSIS — Z9181 History of falling: Secondary | ICD-10-CM | POA: Diagnosis not present

## 2022-06-21 DIAGNOSIS — E1165 Type 2 diabetes mellitus with hyperglycemia: Secondary | ICD-10-CM | POA: Diagnosis not present

## 2022-06-21 DIAGNOSIS — Z7984 Long term (current) use of oral hypoglycemic drugs: Secondary | ICD-10-CM | POA: Diagnosis not present

## 2022-06-21 DIAGNOSIS — E11649 Type 2 diabetes mellitus with hypoglycemia without coma: Secondary | ICD-10-CM | POA: Diagnosis not present

## 2022-06-21 DIAGNOSIS — R296 Repeated falls: Secondary | ICD-10-CM | POA: Diagnosis not present

## 2022-06-21 DIAGNOSIS — J4489 Other specified chronic obstructive pulmonary disease: Secondary | ICD-10-CM | POA: Diagnosis not present

## 2022-06-21 DIAGNOSIS — Z7982 Long term (current) use of aspirin: Secondary | ICD-10-CM | POA: Diagnosis not present

## 2022-06-21 DIAGNOSIS — I2510 Atherosclerotic heart disease of native coronary artery without angina pectoris: Secondary | ICD-10-CM | POA: Diagnosis not present

## 2022-06-21 DIAGNOSIS — R69 Illness, unspecified: Secondary | ICD-10-CM | POA: Diagnosis not present

## 2022-06-21 DIAGNOSIS — I110 Hypertensive heart disease with heart failure: Secondary | ICD-10-CM | POA: Diagnosis not present

## 2022-06-21 DIAGNOSIS — Z951 Presence of aortocoronary bypass graft: Secondary | ICD-10-CM | POA: Diagnosis not present

## 2022-06-21 DIAGNOSIS — Z950 Presence of cardiac pacemaker: Secondary | ICD-10-CM | POA: Diagnosis not present

## 2022-06-21 DIAGNOSIS — E871 Hypo-osmolality and hyponatremia: Secondary | ICD-10-CM | POA: Diagnosis not present

## 2022-06-21 DIAGNOSIS — Z794 Long term (current) use of insulin: Secondary | ICD-10-CM | POA: Diagnosis not present

## 2022-06-21 DIAGNOSIS — I5032 Chronic diastolic (congestive) heart failure: Secondary | ICD-10-CM | POA: Diagnosis not present

## 2022-06-21 DIAGNOSIS — G9341 Metabolic encephalopathy: Secondary | ICD-10-CM | POA: Diagnosis not present

## 2022-06-26 DIAGNOSIS — H1789 Other corneal scars and opacities: Secondary | ICD-10-CM | POA: Diagnosis not present

## 2022-06-27 DIAGNOSIS — R69 Illness, unspecified: Secondary | ICD-10-CM | POA: Diagnosis not present

## 2022-06-27 DIAGNOSIS — I5032 Chronic diastolic (congestive) heart failure: Secondary | ICD-10-CM | POA: Diagnosis not present

## 2022-06-27 DIAGNOSIS — E11649 Type 2 diabetes mellitus with hypoglycemia without coma: Secondary | ICD-10-CM | POA: Diagnosis not present

## 2022-06-27 DIAGNOSIS — Z9181 History of falling: Secondary | ICD-10-CM | POA: Diagnosis not present

## 2022-06-27 DIAGNOSIS — I110 Hypertensive heart disease with heart failure: Secondary | ICD-10-CM | POA: Diagnosis not present

## 2022-06-27 DIAGNOSIS — E871 Hypo-osmolality and hyponatremia: Secondary | ICD-10-CM | POA: Diagnosis not present

## 2022-06-27 DIAGNOSIS — E1165 Type 2 diabetes mellitus with hyperglycemia: Secondary | ICD-10-CM | POA: Diagnosis not present

## 2022-06-27 DIAGNOSIS — Z950 Presence of cardiac pacemaker: Secondary | ICD-10-CM | POA: Diagnosis not present

## 2022-06-27 DIAGNOSIS — Z951 Presence of aortocoronary bypass graft: Secondary | ICD-10-CM | POA: Diagnosis not present

## 2022-06-27 DIAGNOSIS — Z7984 Long term (current) use of oral hypoglycemic drugs: Secondary | ICD-10-CM | POA: Diagnosis not present

## 2022-06-27 DIAGNOSIS — G9341 Metabolic encephalopathy: Secondary | ICD-10-CM | POA: Diagnosis not present

## 2022-06-27 DIAGNOSIS — I2510 Atherosclerotic heart disease of native coronary artery without angina pectoris: Secondary | ICD-10-CM | POA: Diagnosis not present

## 2022-06-27 DIAGNOSIS — G319 Degenerative disease of nervous system, unspecified: Secondary | ICD-10-CM | POA: Diagnosis not present

## 2022-06-27 DIAGNOSIS — R296 Repeated falls: Secondary | ICD-10-CM | POA: Diagnosis not present

## 2022-06-27 DIAGNOSIS — Z794 Long term (current) use of insulin: Secondary | ICD-10-CM | POA: Diagnosis not present

## 2022-06-27 DIAGNOSIS — J4489 Other specified chronic obstructive pulmonary disease: Secondary | ICD-10-CM | POA: Diagnosis not present

## 2022-06-27 DIAGNOSIS — Z7982 Long term (current) use of aspirin: Secondary | ICD-10-CM | POA: Diagnosis not present

## 2022-06-28 DIAGNOSIS — I5032 Chronic diastolic (congestive) heart failure: Secondary | ICD-10-CM | POA: Diagnosis not present

## 2022-06-28 DIAGNOSIS — E871 Hypo-osmolality and hyponatremia: Secondary | ICD-10-CM | POA: Diagnosis not present

## 2022-06-28 DIAGNOSIS — J4489 Other specified chronic obstructive pulmonary disease: Secondary | ICD-10-CM | POA: Diagnosis not present

## 2022-06-28 DIAGNOSIS — Z9181 History of falling: Secondary | ICD-10-CM | POA: Diagnosis not present

## 2022-06-28 DIAGNOSIS — Z7982 Long term (current) use of aspirin: Secondary | ICD-10-CM | POA: Diagnosis not present

## 2022-06-28 DIAGNOSIS — Z951 Presence of aortocoronary bypass graft: Secondary | ICD-10-CM | POA: Diagnosis not present

## 2022-06-28 DIAGNOSIS — Z794 Long term (current) use of insulin: Secondary | ICD-10-CM | POA: Diagnosis not present

## 2022-06-28 DIAGNOSIS — G319 Degenerative disease of nervous system, unspecified: Secondary | ICD-10-CM | POA: Diagnosis not present

## 2022-06-28 DIAGNOSIS — Z950 Presence of cardiac pacemaker: Secondary | ICD-10-CM | POA: Diagnosis not present

## 2022-06-28 DIAGNOSIS — Z7984 Long term (current) use of oral hypoglycemic drugs: Secondary | ICD-10-CM | POA: Diagnosis not present

## 2022-06-28 DIAGNOSIS — R296 Repeated falls: Secondary | ICD-10-CM | POA: Diagnosis not present

## 2022-06-28 DIAGNOSIS — I110 Hypertensive heart disease with heart failure: Secondary | ICD-10-CM | POA: Diagnosis not present

## 2022-06-28 DIAGNOSIS — G9341 Metabolic encephalopathy: Secondary | ICD-10-CM | POA: Diagnosis not present

## 2022-06-28 DIAGNOSIS — E11649 Type 2 diabetes mellitus with hypoglycemia without coma: Secondary | ICD-10-CM | POA: Diagnosis not present

## 2022-06-28 DIAGNOSIS — R69 Illness, unspecified: Secondary | ICD-10-CM | POA: Diagnosis not present

## 2022-06-28 DIAGNOSIS — E1165 Type 2 diabetes mellitus with hyperglycemia: Secondary | ICD-10-CM | POA: Diagnosis not present

## 2022-06-28 DIAGNOSIS — I2510 Atherosclerotic heart disease of native coronary artery without angina pectoris: Secondary | ICD-10-CM | POA: Diagnosis not present

## 2022-06-29 DIAGNOSIS — Z794 Long term (current) use of insulin: Secondary | ICD-10-CM | POA: Diagnosis not present

## 2022-06-29 DIAGNOSIS — Z7982 Long term (current) use of aspirin: Secondary | ICD-10-CM | POA: Diagnosis not present

## 2022-06-29 DIAGNOSIS — I110 Hypertensive heart disease with heart failure: Secondary | ICD-10-CM | POA: Diagnosis not present

## 2022-06-29 DIAGNOSIS — G319 Degenerative disease of nervous system, unspecified: Secondary | ICD-10-CM | POA: Diagnosis not present

## 2022-06-29 DIAGNOSIS — R296 Repeated falls: Secondary | ICD-10-CM | POA: Diagnosis not present

## 2022-06-29 DIAGNOSIS — R69 Illness, unspecified: Secondary | ICD-10-CM | POA: Diagnosis not present

## 2022-06-29 DIAGNOSIS — Z7984 Long term (current) use of oral hypoglycemic drugs: Secondary | ICD-10-CM | POA: Diagnosis not present

## 2022-06-29 DIAGNOSIS — I5032 Chronic diastolic (congestive) heart failure: Secondary | ICD-10-CM | POA: Diagnosis not present

## 2022-06-29 DIAGNOSIS — Z950 Presence of cardiac pacemaker: Secondary | ICD-10-CM | POA: Diagnosis not present

## 2022-06-29 DIAGNOSIS — Z951 Presence of aortocoronary bypass graft: Secondary | ICD-10-CM | POA: Diagnosis not present

## 2022-06-29 DIAGNOSIS — J4489 Other specified chronic obstructive pulmonary disease: Secondary | ICD-10-CM | POA: Diagnosis not present

## 2022-06-29 DIAGNOSIS — G9341 Metabolic encephalopathy: Secondary | ICD-10-CM | POA: Diagnosis not present

## 2022-06-29 DIAGNOSIS — E871 Hypo-osmolality and hyponatremia: Secondary | ICD-10-CM | POA: Diagnosis not present

## 2022-06-29 DIAGNOSIS — I2510 Atherosclerotic heart disease of native coronary artery without angina pectoris: Secondary | ICD-10-CM | POA: Diagnosis not present

## 2022-06-29 DIAGNOSIS — Z9181 History of falling: Secondary | ICD-10-CM | POA: Diagnosis not present

## 2022-06-29 DIAGNOSIS — E11649 Type 2 diabetes mellitus with hypoglycemia without coma: Secondary | ICD-10-CM | POA: Diagnosis not present

## 2022-06-29 DIAGNOSIS — E1165 Type 2 diabetes mellitus with hyperglycemia: Secondary | ICD-10-CM | POA: Diagnosis not present

## 2022-07-03 DIAGNOSIS — H16011 Central corneal ulcer, right eye: Secondary | ICD-10-CM | POA: Diagnosis not present

## 2022-07-05 DIAGNOSIS — Z794 Long term (current) use of insulin: Secondary | ICD-10-CM | POA: Diagnosis not present

## 2022-07-05 DIAGNOSIS — I5032 Chronic diastolic (congestive) heart failure: Secondary | ICD-10-CM | POA: Diagnosis not present

## 2022-07-05 DIAGNOSIS — R296 Repeated falls: Secondary | ICD-10-CM | POA: Diagnosis not present

## 2022-07-05 DIAGNOSIS — E1165 Type 2 diabetes mellitus with hyperglycemia: Secondary | ICD-10-CM | POA: Diagnosis not present

## 2022-07-05 DIAGNOSIS — I110 Hypertensive heart disease with heart failure: Secondary | ICD-10-CM | POA: Diagnosis not present

## 2022-07-05 DIAGNOSIS — G9341 Metabolic encephalopathy: Secondary | ICD-10-CM | POA: Diagnosis not present

## 2022-07-05 DIAGNOSIS — E871 Hypo-osmolality and hyponatremia: Secondary | ICD-10-CM | POA: Diagnosis not present

## 2022-07-05 DIAGNOSIS — Z7982 Long term (current) use of aspirin: Secondary | ICD-10-CM | POA: Diagnosis not present

## 2022-07-05 DIAGNOSIS — E11649 Type 2 diabetes mellitus with hypoglycemia without coma: Secondary | ICD-10-CM | POA: Diagnosis not present

## 2022-07-05 DIAGNOSIS — G319 Degenerative disease of nervous system, unspecified: Secondary | ICD-10-CM | POA: Diagnosis not present

## 2022-07-05 DIAGNOSIS — Z951 Presence of aortocoronary bypass graft: Secondary | ICD-10-CM | POA: Diagnosis not present

## 2022-07-05 DIAGNOSIS — Z9181 History of falling: Secondary | ICD-10-CM | POA: Diagnosis not present

## 2022-07-05 DIAGNOSIS — I2510 Atherosclerotic heart disease of native coronary artery without angina pectoris: Secondary | ICD-10-CM | POA: Diagnosis not present

## 2022-07-05 DIAGNOSIS — Z950 Presence of cardiac pacemaker: Secondary | ICD-10-CM | POA: Diagnosis not present

## 2022-07-05 DIAGNOSIS — Z7984 Long term (current) use of oral hypoglycemic drugs: Secondary | ICD-10-CM | POA: Diagnosis not present

## 2022-07-05 DIAGNOSIS — J4489 Other specified chronic obstructive pulmonary disease: Secondary | ICD-10-CM | POA: Diagnosis not present

## 2022-07-05 DIAGNOSIS — R69 Illness, unspecified: Secondary | ICD-10-CM | POA: Diagnosis not present

## 2022-07-10 DIAGNOSIS — H16041 Marginal corneal ulcer, right eye: Secondary | ICD-10-CM | POA: Diagnosis not present

## 2022-07-12 DIAGNOSIS — E871 Hypo-osmolality and hyponatremia: Secondary | ICD-10-CM | POA: Diagnosis not present

## 2022-07-12 DIAGNOSIS — Z7984 Long term (current) use of oral hypoglycemic drugs: Secondary | ICD-10-CM | POA: Diagnosis not present

## 2022-07-12 DIAGNOSIS — Z7982 Long term (current) use of aspirin: Secondary | ICD-10-CM | POA: Diagnosis not present

## 2022-07-12 DIAGNOSIS — E11649 Type 2 diabetes mellitus with hypoglycemia without coma: Secondary | ICD-10-CM | POA: Diagnosis not present

## 2022-07-12 DIAGNOSIS — J4489 Other specified chronic obstructive pulmonary disease: Secondary | ICD-10-CM | POA: Diagnosis not present

## 2022-07-12 DIAGNOSIS — I2510 Atherosclerotic heart disease of native coronary artery without angina pectoris: Secondary | ICD-10-CM | POA: Diagnosis not present

## 2022-07-12 DIAGNOSIS — I110 Hypertensive heart disease with heart failure: Secondary | ICD-10-CM | POA: Diagnosis not present

## 2022-07-12 DIAGNOSIS — G319 Degenerative disease of nervous system, unspecified: Secondary | ICD-10-CM | POA: Diagnosis not present

## 2022-07-12 DIAGNOSIS — G9341 Metabolic encephalopathy: Secondary | ICD-10-CM | POA: Diagnosis not present

## 2022-07-12 DIAGNOSIS — I5032 Chronic diastolic (congestive) heart failure: Secondary | ICD-10-CM | POA: Diagnosis not present

## 2022-07-12 DIAGNOSIS — E1165 Type 2 diabetes mellitus with hyperglycemia: Secondary | ICD-10-CM | POA: Diagnosis not present

## 2022-07-12 DIAGNOSIS — R296 Repeated falls: Secondary | ICD-10-CM | POA: Diagnosis not present

## 2022-07-12 DIAGNOSIS — Z950 Presence of cardiac pacemaker: Secondary | ICD-10-CM | POA: Diagnosis not present

## 2022-07-12 DIAGNOSIS — R69 Illness, unspecified: Secondary | ICD-10-CM | POA: Diagnosis not present

## 2022-07-12 DIAGNOSIS — Z951 Presence of aortocoronary bypass graft: Secondary | ICD-10-CM | POA: Diagnosis not present

## 2022-07-12 DIAGNOSIS — Z794 Long term (current) use of insulin: Secondary | ICD-10-CM | POA: Diagnosis not present

## 2022-07-12 DIAGNOSIS — Z9181 History of falling: Secondary | ICD-10-CM | POA: Diagnosis not present

## 2022-07-14 DIAGNOSIS — H10401 Unspecified chronic conjunctivitis, right eye: Secondary | ICD-10-CM | POA: Diagnosis not present

## 2022-07-14 DIAGNOSIS — R269 Unspecified abnormalities of gait and mobility: Secondary | ICD-10-CM | POA: Diagnosis not present

## 2022-07-21 DIAGNOSIS — H10401 Unspecified chronic conjunctivitis, right eye: Secondary | ICD-10-CM | POA: Diagnosis not present

## 2022-08-01 DIAGNOSIS — H10401 Unspecified chronic conjunctivitis, right eye: Secondary | ICD-10-CM | POA: Diagnosis not present

## 2022-08-13 DIAGNOSIS — R269 Unspecified abnormalities of gait and mobility: Secondary | ICD-10-CM | POA: Diagnosis not present

## 2022-08-29 ENCOUNTER — Ambulatory Visit (INDEPENDENT_AMBULATORY_CARE_PROVIDER_SITE_OTHER): Payer: Medicare HMO

## 2022-08-29 DIAGNOSIS — I442 Atrioventricular block, complete: Secondary | ICD-10-CM | POA: Diagnosis not present

## 2022-08-29 LAB — CUP PACEART REMOTE DEVICE CHECK
Battery Remaining Longevity: 93 mo
Battery Voltage: 2.99 V
Brady Statistic AS VP Percent: 92.38 %
Brady Statistic AS VS Percent: 0 %
Brady Statistic RV Percent Paced: 99.62 %
Date Time Interrogation Session: 20240525143946
Implantable Pulse Generator Implant Date: 20201030
Lead Channel Impedance Value: 680 Ohm
Lead Channel Pacing Threshold Amplitude: 0.375 V
Lead Channel Pacing Threshold Pulse Width: 0.24 ms
Lead Channel Sensing Intrinsic Amplitude: 22.95 mV
Lead Channel Setting Pacing Amplitude: 0.875
Lead Channel Setting Pacing Pulse Width: 0.24 ms
Lead Channel Setting Sensing Sensitivity: 2 mV

## 2022-08-30 DIAGNOSIS — S0531XA Ocular laceration without prolapse or loss of intraocular tissue, right eye, initial encounter: Secondary | ICD-10-CM | POA: Diagnosis not present

## 2022-08-30 DIAGNOSIS — X58XXXA Exposure to other specified factors, initial encounter: Secondary | ICD-10-CM | POA: Diagnosis not present

## 2022-08-30 DIAGNOSIS — S0521XA Ocular laceration and rupture with prolapse or loss of intraocular tissue, right eye, initial encounter: Secondary | ICD-10-CM | POA: Diagnosis not present

## 2022-08-31 DIAGNOSIS — D638 Anemia in other chronic diseases classified elsewhere: Secondary | ICD-10-CM | POA: Diagnosis not present

## 2022-08-31 DIAGNOSIS — Z01818 Encounter for other preprocedural examination: Secondary | ICD-10-CM | POA: Diagnosis not present

## 2022-08-31 DIAGNOSIS — C4A30 Merkel cell carcinoma of unspecified part of face: Secondary | ICD-10-CM | POA: Diagnosis not present

## 2022-08-31 DIAGNOSIS — J449 Chronic obstructive pulmonary disease, unspecified: Secondary | ICD-10-CM | POA: Diagnosis not present

## 2022-08-31 DIAGNOSIS — J45909 Unspecified asthma, uncomplicated: Secondary | ICD-10-CM | POA: Diagnosis not present

## 2022-08-31 DIAGNOSIS — Z950 Presence of cardiac pacemaker: Secondary | ICD-10-CM | POA: Diagnosis not present

## 2022-08-31 DIAGNOSIS — E119 Type 2 diabetes mellitus without complications: Secondary | ICD-10-CM | POA: Diagnosis not present

## 2022-08-31 DIAGNOSIS — I10 Essential (primary) hypertension: Secondary | ICD-10-CM | POA: Diagnosis not present

## 2022-08-31 DIAGNOSIS — I2510 Atherosclerotic heart disease of native coronary artery without angina pectoris: Secondary | ICD-10-CM | POA: Diagnosis not present

## 2022-08-31 DIAGNOSIS — Z8679 Personal history of other diseases of the circulatory system: Secondary | ICD-10-CM | POA: Diagnosis not present

## 2022-08-31 DIAGNOSIS — K5090 Crohn's disease, unspecified, without complications: Secondary | ICD-10-CM | POA: Diagnosis not present

## 2022-08-31 DIAGNOSIS — Z951 Presence of aortocoronary bypass graft: Secondary | ICD-10-CM | POA: Diagnosis not present

## 2022-09-01 DIAGNOSIS — Z950 Presence of cardiac pacemaker: Secondary | ICD-10-CM | POA: Diagnosis not present

## 2022-09-01 DIAGNOSIS — H54415A Blindness right eye category 5, normal vision left eye: Secondary | ICD-10-CM | POA: Diagnosis not present

## 2022-09-01 DIAGNOSIS — I10 Essential (primary) hypertension: Secondary | ICD-10-CM | POA: Diagnosis not present

## 2022-09-01 DIAGNOSIS — I5032 Chronic diastolic (congestive) heart failure: Secondary | ICD-10-CM | POA: Diagnosis not present

## 2022-09-01 DIAGNOSIS — H409 Unspecified glaucoma: Secondary | ICD-10-CM | POA: Diagnosis not present

## 2022-09-01 DIAGNOSIS — I110 Hypertensive heart disease with heart failure: Secondary | ICD-10-CM | POA: Diagnosis not present

## 2022-09-01 DIAGNOSIS — Z9181 History of falling: Secondary | ICD-10-CM | POA: Diagnosis not present

## 2022-09-01 DIAGNOSIS — Z7982 Long term (current) use of aspirin: Secondary | ICD-10-CM | POA: Diagnosis not present

## 2022-09-01 DIAGNOSIS — E785 Hyperlipidemia, unspecified: Secondary | ICD-10-CM | POA: Diagnosis not present

## 2022-09-01 DIAGNOSIS — H2011 Chronic iridocyclitis, right eye: Secondary | ICD-10-CM | POA: Diagnosis not present

## 2022-09-01 DIAGNOSIS — D631 Anemia in chronic kidney disease: Secondary | ICD-10-CM | POA: Diagnosis not present

## 2022-09-01 DIAGNOSIS — H16001 Unspecified corneal ulcer, right eye: Secondary | ICD-10-CM | POA: Diagnosis not present

## 2022-09-01 DIAGNOSIS — C4A30 Merkel cell carcinoma of unspecified part of face: Secondary | ICD-10-CM | POA: Diagnosis not present

## 2022-09-01 DIAGNOSIS — C44329 Squamous cell carcinoma of skin of other parts of face: Secondary | ICD-10-CM | POA: Diagnosis not present

## 2022-09-01 DIAGNOSIS — J449 Chronic obstructive pulmonary disease, unspecified: Secondary | ICD-10-CM | POA: Diagnosis not present

## 2022-09-01 DIAGNOSIS — Z7984 Long term (current) use of oral hypoglycemic drugs: Secondary | ICD-10-CM | POA: Diagnosis not present

## 2022-09-01 DIAGNOSIS — Z79899 Other long term (current) drug therapy: Secondary | ICD-10-CM | POA: Diagnosis not present

## 2022-09-01 DIAGNOSIS — K5090 Crohn's disease, unspecified, without complications: Secondary | ICD-10-CM | POA: Diagnosis not present

## 2022-09-01 DIAGNOSIS — E119 Type 2 diabetes mellitus without complications: Secondary | ICD-10-CM | POA: Diagnosis not present

## 2022-09-01 DIAGNOSIS — H5789 Other specified disorders of eye and adnexa: Secondary | ICD-10-CM | POA: Diagnosis not present

## 2022-09-01 DIAGNOSIS — H11231 Symblepharon, right eye: Secondary | ICD-10-CM | POA: Diagnosis not present

## 2022-09-01 DIAGNOSIS — I2510 Atherosclerotic heart disease of native coronary artery without angina pectoris: Secondary | ICD-10-CM | POA: Diagnosis not present

## 2022-09-12 DIAGNOSIS — E1169 Type 2 diabetes mellitus with other specified complication: Secondary | ICD-10-CM | POA: Diagnosis not present

## 2022-09-12 DIAGNOSIS — Z1152 Encounter for screening for COVID-19: Secondary | ICD-10-CM | POA: Diagnosis not present

## 2022-09-12 DIAGNOSIS — R2681 Unsteadiness on feet: Secondary | ICD-10-CM | POA: Diagnosis not present

## 2022-09-12 DIAGNOSIS — R0981 Nasal congestion: Secondary | ICD-10-CM | POA: Diagnosis not present

## 2022-09-12 DIAGNOSIS — J069 Acute upper respiratory infection, unspecified: Secondary | ICD-10-CM | POA: Diagnosis not present

## 2022-09-12 DIAGNOSIS — J449 Chronic obstructive pulmonary disease, unspecified: Secondary | ICD-10-CM | POA: Diagnosis not present

## 2022-09-12 DIAGNOSIS — I10 Essential (primary) hypertension: Secondary | ICD-10-CM | POA: Diagnosis not present

## 2022-09-12 DIAGNOSIS — J029 Acute pharyngitis, unspecified: Secondary | ICD-10-CM | POA: Diagnosis not present

## 2022-09-12 DIAGNOSIS — R051 Acute cough: Secondary | ICD-10-CM | POA: Diagnosis not present

## 2022-09-13 DIAGNOSIS — R269 Unspecified abnormalities of gait and mobility: Secondary | ICD-10-CM | POA: Diagnosis not present

## 2022-09-22 NOTE — Progress Notes (Signed)
Remote pacemaker transmission.   

## 2022-10-13 DIAGNOSIS — R269 Unspecified abnormalities of gait and mobility: Secondary | ICD-10-CM | POA: Diagnosis not present

## 2022-10-23 DIAGNOSIS — E1169 Type 2 diabetes mellitus with other specified complication: Secondary | ICD-10-CM | POA: Diagnosis not present

## 2022-10-23 DIAGNOSIS — R413 Other amnesia: Secondary | ICD-10-CM | POA: Diagnosis not present

## 2022-10-23 DIAGNOSIS — F32A Depression, unspecified: Secondary | ICD-10-CM | POA: Diagnosis not present

## 2022-10-23 DIAGNOSIS — I10 Essential (primary) hypertension: Secondary | ICD-10-CM | POA: Diagnosis not present

## 2022-10-23 DIAGNOSIS — J449 Chronic obstructive pulmonary disease, unspecified: Secondary | ICD-10-CM | POA: Diagnosis not present

## 2022-10-23 DIAGNOSIS — R2681 Unsteadiness on feet: Secondary | ICD-10-CM | POA: Diagnosis not present

## 2022-10-30 DIAGNOSIS — Z794 Long term (current) use of insulin: Secondary | ICD-10-CM | POA: Diagnosis not present

## 2022-10-30 DIAGNOSIS — N529 Male erectile dysfunction, unspecified: Secondary | ICD-10-CM | POA: Diagnosis not present

## 2022-10-30 DIAGNOSIS — G309 Alzheimer's disease, unspecified: Secondary | ICD-10-CM | POA: Diagnosis not present

## 2022-10-30 DIAGNOSIS — Z7984 Long term (current) use of oral hypoglycemic drugs: Secondary | ICD-10-CM | POA: Diagnosis not present

## 2022-10-30 DIAGNOSIS — I110 Hypertensive heart disease with heart failure: Secondary | ICD-10-CM | POA: Diagnosis not present

## 2022-10-30 DIAGNOSIS — R2681 Unsteadiness on feet: Secondary | ICD-10-CM | POA: Diagnosis not present

## 2022-10-30 DIAGNOSIS — K08109 Complete loss of teeth, unspecified cause, unspecified class: Secondary | ICD-10-CM | POA: Diagnosis not present

## 2022-10-30 DIAGNOSIS — G4700 Insomnia, unspecified: Secondary | ICD-10-CM | POA: Diagnosis not present

## 2022-10-30 DIAGNOSIS — Z87891 Personal history of nicotine dependence: Secondary | ICD-10-CM | POA: Diagnosis not present

## 2022-10-30 DIAGNOSIS — I2510 Atherosclerotic heart disease of native coronary artery without angina pectoris: Secondary | ICD-10-CM | POA: Diagnosis not present

## 2022-10-30 DIAGNOSIS — E119 Type 2 diabetes mellitus without complications: Secondary | ICD-10-CM | POA: Diagnosis not present

## 2022-10-30 DIAGNOSIS — E785 Hyperlipidemia, unspecified: Secondary | ICD-10-CM | POA: Diagnosis not present

## 2022-11-13 DIAGNOSIS — R269 Unspecified abnormalities of gait and mobility: Secondary | ICD-10-CM | POA: Diagnosis not present

## 2022-12-14 DIAGNOSIS — R269 Unspecified abnormalities of gait and mobility: Secondary | ICD-10-CM | POA: Diagnosis not present

## 2022-12-15 DIAGNOSIS — H11232 Symblepharon, left eye: Secondary | ICD-10-CM | POA: Diagnosis not present

## 2022-12-21 DIAGNOSIS — L84 Corns and callosities: Secondary | ICD-10-CM | POA: Diagnosis not present

## 2022-12-21 DIAGNOSIS — E1142 Type 2 diabetes mellitus with diabetic polyneuropathy: Secondary | ICD-10-CM | POA: Diagnosis not present

## 2022-12-21 DIAGNOSIS — M79676 Pain in unspecified toe(s): Secondary | ICD-10-CM | POA: Diagnosis not present

## 2022-12-21 DIAGNOSIS — B351 Tinea unguium: Secondary | ICD-10-CM | POA: Diagnosis not present

## 2023-01-09 ENCOUNTER — Other Ambulatory Visit: Payer: Self-pay | Admitting: Physician Assistant

## 2023-01-13 DIAGNOSIS — R269 Unspecified abnormalities of gait and mobility: Secondary | ICD-10-CM | POA: Diagnosis not present

## 2023-01-23 ENCOUNTER — Other Ambulatory Visit: Payer: Self-pay | Admitting: Cardiovascular Disease

## 2023-02-13 DIAGNOSIS — R269 Unspecified abnormalities of gait and mobility: Secondary | ICD-10-CM | POA: Diagnosis not present

## 2023-02-27 DIAGNOSIS — M79676 Pain in unspecified toe(s): Secondary | ICD-10-CM | POA: Diagnosis not present

## 2023-02-27 DIAGNOSIS — B351 Tinea unguium: Secondary | ICD-10-CM | POA: Diagnosis not present

## 2023-02-27 DIAGNOSIS — L84 Corns and callosities: Secondary | ICD-10-CM | POA: Diagnosis not present

## 2023-02-27 DIAGNOSIS — E1142 Type 2 diabetes mellitus with diabetic polyneuropathy: Secondary | ICD-10-CM | POA: Diagnosis not present

## 2023-03-15 DIAGNOSIS — G479 Sleep disorder, unspecified: Secondary | ICD-10-CM | POA: Diagnosis not present

## 2023-03-15 DIAGNOSIS — R413 Other amnesia: Secondary | ICD-10-CM | POA: Diagnosis not present

## 2023-03-15 DIAGNOSIS — R269 Unspecified abnormalities of gait and mobility: Secondary | ICD-10-CM | POA: Diagnosis not present

## 2023-03-15 DIAGNOSIS — F32A Depression, unspecified: Secondary | ICD-10-CM | POA: Diagnosis not present

## 2023-03-15 DIAGNOSIS — B372 Candidiasis of skin and nail: Secondary | ICD-10-CM | POA: Diagnosis not present

## 2023-04-09 DIAGNOSIS — E1169 Type 2 diabetes mellitus with other specified complication: Secondary | ICD-10-CM | POA: Diagnosis not present

## 2023-04-09 DIAGNOSIS — L02211 Cutaneous abscess of abdominal wall: Secondary | ICD-10-CM | POA: Diagnosis not present

## 2023-04-09 DIAGNOSIS — B372 Candidiasis of skin and nail: Secondary | ICD-10-CM | POA: Diagnosis not present

## 2023-05-02 DIAGNOSIS — E1165 Type 2 diabetes mellitus with hyperglycemia: Secondary | ICD-10-CM | POA: Diagnosis not present

## 2023-05-02 DIAGNOSIS — F32A Depression, unspecified: Secondary | ICD-10-CM | POA: Diagnosis not present

## 2023-05-02 DIAGNOSIS — I10 Essential (primary) hypertension: Secondary | ICD-10-CM | POA: Diagnosis not present

## 2023-05-02 DIAGNOSIS — I25810 Atherosclerosis of coronary artery bypass graft(s) without angina pectoris: Secondary | ICD-10-CM | POA: Diagnosis not present

## 2023-05-02 DIAGNOSIS — F0390 Unspecified dementia without behavioral disturbance: Secondary | ICD-10-CM | POA: Diagnosis not present

## 2023-05-02 DIAGNOSIS — E785 Hyperlipidemia, unspecified: Secondary | ICD-10-CM | POA: Diagnosis not present

## 2023-05-02 DIAGNOSIS — I442 Atrioventricular block, complete: Secondary | ICD-10-CM | POA: Diagnosis not present

## 2023-05-02 DIAGNOSIS — K51919 Ulcerative colitis, unspecified with unspecified complications: Secondary | ICD-10-CM | POA: Diagnosis not present

## 2023-05-02 DIAGNOSIS — J449 Chronic obstructive pulmonary disease, unspecified: Secondary | ICD-10-CM | POA: Diagnosis not present

## 2023-05-11 ENCOUNTER — Other Ambulatory Visit: Payer: Self-pay | Admitting: Physician Assistant

## 2023-05-15 DIAGNOSIS — E1142 Type 2 diabetes mellitus with diabetic polyneuropathy: Secondary | ICD-10-CM | POA: Diagnosis not present

## 2023-05-15 DIAGNOSIS — L84 Corns and callosities: Secondary | ICD-10-CM | POA: Diagnosis not present

## 2023-05-15 DIAGNOSIS — M79676 Pain in unspecified toe(s): Secondary | ICD-10-CM | POA: Diagnosis not present

## 2023-05-15 DIAGNOSIS — B351 Tinea unguium: Secondary | ICD-10-CM | POA: Diagnosis not present

## 2023-05-29 ENCOUNTER — Ambulatory Visit (INDEPENDENT_AMBULATORY_CARE_PROVIDER_SITE_OTHER): Payer: Medicare HMO

## 2023-05-29 DIAGNOSIS — I442 Atrioventricular block, complete: Secondary | ICD-10-CM | POA: Diagnosis not present

## 2023-06-01 LAB — CUP PACEART REMOTE DEVICE CHECK
Battery Remaining Longevity: 89 mo
Battery Voltage: 2.98 V
Brady Statistic AS VP Percent: 92.41 %
Brady Statistic AS VS Percent: 0 %
Brady Statistic RV Percent Paced: 99.64 %
Date Time Interrogation Session: 20250227182946
Implantable Pulse Generator Implant Date: 20201030
Lead Channel Impedance Value: 690 Ohm
Lead Channel Pacing Threshold Amplitude: 0.375 V
Lead Channel Pacing Threshold Pulse Width: 0.24 ms
Lead Channel Sensing Intrinsic Amplitude: 25.763 mV
Lead Channel Setting Pacing Amplitude: 0.875
Lead Channel Setting Pacing Pulse Width: 0.24 ms
Lead Channel Setting Sensing Sensitivity: 2 mV

## 2023-07-09 NOTE — Progress Notes (Signed)
 Remote pacemaker transmission.

## 2023-07-09 NOTE — Addendum Note (Signed)
 Addended by: Elease Etienne A on: 07/09/2023 10:48 AM   Modules accepted: Orders

## 2023-07-24 DIAGNOSIS — E1142 Type 2 diabetes mellitus with diabetic polyneuropathy: Secondary | ICD-10-CM | POA: Diagnosis not present

## 2023-07-24 DIAGNOSIS — L84 Corns and callosities: Secondary | ICD-10-CM | POA: Diagnosis not present

## 2023-07-24 DIAGNOSIS — M79675 Pain in left toe(s): Secondary | ICD-10-CM | POA: Diagnosis not present

## 2023-07-24 DIAGNOSIS — M79674 Pain in right toe(s): Secondary | ICD-10-CM | POA: Diagnosis not present

## 2023-07-24 DIAGNOSIS — B351 Tinea unguium: Secondary | ICD-10-CM | POA: Diagnosis not present

## 2023-09-04 DIAGNOSIS — H9203 Otalgia, bilateral: Secondary | ICD-10-CM | POA: Diagnosis not present

## 2023-09-04 DIAGNOSIS — E1165 Type 2 diabetes mellitus with hyperglycemia: Secondary | ICD-10-CM | POA: Diagnosis not present

## 2023-09-04 DIAGNOSIS — F0390 Unspecified dementia without behavioral disturbance: Secondary | ICD-10-CM | POA: Diagnosis not present

## 2023-10-02 ENCOUNTER — Ambulatory Visit

## 2023-10-02 DIAGNOSIS — I442 Atrioventricular block, complete: Secondary | ICD-10-CM

## 2023-10-02 LAB — CUP PACEART REMOTE DEVICE CHECK
Battery Remaining Longevity: 85 mo
Battery Voltage: 2.97 V
Brady Statistic AS VP Percent: 92.49 %
Brady Statistic AS VS Percent: 0 %
Brady Statistic RV Percent Paced: 99.66 %
Date Time Interrogation Session: 20250701031446
Implantable Pulse Generator Implant Date: 20201030
Lead Channel Impedance Value: 690 Ohm
Lead Channel Pacing Threshold Amplitude: 0.375 V
Lead Channel Pacing Threshold Pulse Width: 0.24 ms
Lead Channel Sensing Intrinsic Amplitude: 28.575 mV
Lead Channel Setting Pacing Amplitude: 1 V
Lead Channel Setting Pacing Pulse Width: 0.24 ms
Lead Channel Setting Sensing Sensitivity: 2 mV

## 2023-10-07 ENCOUNTER — Ambulatory Visit: Payer: Self-pay | Admitting: Cardiovascular Disease

## 2023-10-09 DIAGNOSIS — L84 Corns and callosities: Secondary | ICD-10-CM | POA: Diagnosis not present

## 2023-10-09 DIAGNOSIS — M79674 Pain in right toe(s): Secondary | ICD-10-CM | POA: Diagnosis not present

## 2023-10-09 DIAGNOSIS — B351 Tinea unguium: Secondary | ICD-10-CM | POA: Diagnosis not present

## 2023-10-09 DIAGNOSIS — E1142 Type 2 diabetes mellitus with diabetic polyneuropathy: Secondary | ICD-10-CM | POA: Diagnosis not present

## 2023-10-09 DIAGNOSIS — M79675 Pain in left toe(s): Secondary | ICD-10-CM | POA: Diagnosis not present

## 2023-11-07 NOTE — Telephone Encounter (Signed)
 SABRA

## 2024-01-01 ENCOUNTER — Encounter

## 2024-01-03 NOTE — Progress Notes (Signed)
 Remote PPM Transmission

## 2024-01-10 DIAGNOSIS — M79675 Pain in left toe(s): Secondary | ICD-10-CM | POA: Diagnosis not present

## 2024-01-10 DIAGNOSIS — L84 Corns and callosities: Secondary | ICD-10-CM | POA: Diagnosis not present

## 2024-01-10 DIAGNOSIS — B351 Tinea unguium: Secondary | ICD-10-CM | POA: Diagnosis not present

## 2024-01-10 DIAGNOSIS — E1142 Type 2 diabetes mellitus with diabetic polyneuropathy: Secondary | ICD-10-CM | POA: Diagnosis not present

## 2024-01-10 DIAGNOSIS — M79674 Pain in right toe(s): Secondary | ICD-10-CM | POA: Diagnosis not present

## 2024-02-14 ENCOUNTER — Telehealth: Payer: Self-pay

## 2024-02-14 NOTE — Telephone Encounter (Signed)
 Patient has not been seen in clinic for MICRA PPM follow up since 2023.  Please reach out and set patient up with Dr. Mealor for past due follow up.  Add to appointment notes:  Verify remote monitoring, Missed recent transmission.   Thanks.

## 2024-02-15 NOTE — Telephone Encounter (Signed)
 Spoke w/ patient - he is scheduled to see Dr. Nancey 11/21.

## 2024-02-22 ENCOUNTER — Encounter: Payer: Self-pay | Admitting: Cardiovascular Disease

## 2024-02-22 ENCOUNTER — Ambulatory Visit: Attending: Cardiovascular Disease | Admitting: Cardiovascular Disease

## 2024-02-22 VITALS — BP 130/72 | HR 83 | Ht 72.0 in | Wt 175.0 lb

## 2024-02-22 DIAGNOSIS — D0462 Carcinoma in situ of skin of left upper limb, including shoulder: Secondary | ICD-10-CM | POA: Diagnosis not present

## 2024-02-22 DIAGNOSIS — I442 Atrioventricular block, complete: Secondary | ICD-10-CM | POA: Diagnosis not present

## 2024-02-22 DIAGNOSIS — D0461 Carcinoma in situ of skin of right upper limb, including shoulder: Secondary | ICD-10-CM | POA: Diagnosis not present

## 2024-02-22 DIAGNOSIS — D485 Neoplasm of uncertain behavior of skin: Secondary | ICD-10-CM | POA: Diagnosis not present

## 2024-02-22 DIAGNOSIS — D044 Carcinoma in situ of skin of scalp and neck: Secondary | ICD-10-CM | POA: Diagnosis not present

## 2024-02-22 DIAGNOSIS — L570 Actinic keratosis: Secondary | ICD-10-CM | POA: Diagnosis not present

## 2024-02-22 DIAGNOSIS — C44629 Squamous cell carcinoma of skin of left upper limb, including shoulder: Secondary | ICD-10-CM | POA: Diagnosis not present

## 2024-02-22 NOTE — Progress Notes (Signed)
  Electrophysiology Office Note:    Date:  02/22/2024   ID:  Nathan Howell, DOB 05-Sep-1934, MRN [id number]  PCP:  Shepard Ade, MD   [facility] Providers Cardiologist:  Debby Sor, MD (Inactive) Electrophysiologist:  Lynwood Rakers, MD (Inactive)     Referring MD: Shepard Ade, MD   History of Present Illness:    Nathan Howell is a 89 y.o. male with a medical history significant for complete heart block, coronary tucidinostat post CABG, who presents for pacemaker follow-up.      History of Present Illness He has a Garment/textile technologist pacemaker implanted in 2023 for complete heart block.  He felt much better after pacemaker placement.         Today, he reports he is well and has no complaints.  EKGs/Labs/Other Studies Reviewed Today:     Echocardiogram:  TTE October 2020 LVEF 50 to 55%.  No LVH.  Grade 1 diastolic dysfunction.  Normal biatrial size    EKG:         Physical Exam:    VS:  BP 130/72   Pulse 83   Ht 6' (1.829 m)   Wt 175 lb (79.4 kg)   SpO2 96%   BMI 23.73 kg/m     Wt Readings from Last 3 Encounters:  02/22/24 175 lb (79.4 kg)  04/25/22 160 lb 11.5 oz (72.9 kg)  12/22/21 166 lb (75.3 kg)     GEN: Well nourished, well developed in no acute distress CARDIAC: RRR, no murmurs, rubs, gallops RESPIRATORY:  Normal work of breathing MUSCULOSKELETAL: no edema    ASSESSMENT & PLAN:     Complete heart block Medtronic Micra in place  CAD Status post CABG No anginal symptoms to report     Signed, Eulas FORBES Furbish, MD  02/22/2024 3:16 PM    [facility]

## 2024-02-22 NOTE — Patient Instructions (Signed)
 Medication Instructions:  Your physician recommends that you continue on your current medications as directed. Please refer to the Current Medication list given to you today.  *If you need a refill on your cardiac medications before your next appointment, please call your pharmacy*  Lab Work: None ordered.  If you have labs (blood work) drawn today and your tests are completely normal, you will receive your results only by: MyChart Message (if you have MyChart) OR A paper copy in the mail If you have any lab test that is abnormal or we need to change your treatment, we will call you to review the results.  Testing/Procedures: None ordered.   Follow-Up: At [facility], you and your health needs are our priority.  As part of our continuing mission to provide you with exceptional heart care, our providers are all part of one team.  This team includes your primary Cardiologist (physician) and Advanced Practice Providers or APPs (Physician Assistants and Nurse Practitioners) who all work together to provide you with the care you need, when you need it.  Your next appointment:   2 years with EP APP

## 2024-03-25 DIAGNOSIS — M79674 Pain in right toe(s): Secondary | ICD-10-CM | POA: Diagnosis not present

## 2024-03-25 DIAGNOSIS — L84 Corns and callosities: Secondary | ICD-10-CM | POA: Diagnosis not present

## 2024-03-25 DIAGNOSIS — M79675 Pain in left toe(s): Secondary | ICD-10-CM | POA: Diagnosis not present

## 2024-03-25 DIAGNOSIS — E1142 Type 2 diabetes mellitus with diabetic polyneuropathy: Secondary | ICD-10-CM | POA: Diagnosis not present

## 2024-03-25 DIAGNOSIS — B351 Tinea unguium: Secondary | ICD-10-CM | POA: Diagnosis not present

## 2024-04-01 ENCOUNTER — Ambulatory Visit

## 2024-04-01 DIAGNOSIS — I442 Atrioventricular block, complete: Secondary | ICD-10-CM | POA: Diagnosis not present

## 2024-04-04 LAB — CUP PACEART REMOTE DEVICE CHECK
Battery Remaining Longevity: 77 mo
Battery Voltage: 2.96 V
Brady Statistic AS VP Percent: 95.7 %
Brady Statistic AS VS Percent: 0 %
Brady Statistic RV Percent Paced: 99.87 %
Date Time Interrogation Session: 20260102163946
Implantable Pulse Generator Implant Date: 20201030
Lead Channel Impedance Value: 690 Ohm
Lead Channel Pacing Threshold Amplitude: 0.375 V
Lead Channel Pacing Threshold Pulse Width: 0.24 ms
Lead Channel Sensing Intrinsic Amplitude: 28.575 mV
Lead Channel Setting Pacing Amplitude: 1 V
Lead Channel Setting Pacing Pulse Width: 0.24 ms
Lead Channel Setting Sensing Sensitivity: 2 mV

## 2024-04-07 NOTE — Progress Notes (Signed)
 Remote PPM Transmission

## 2024-04-12 ENCOUNTER — Ambulatory Visit: Payer: Self-pay | Admitting: Cardiovascular Disease

## 2024-07-01 ENCOUNTER — Encounter

## 2024-09-30 ENCOUNTER — Encounter

## 2024-12-30 ENCOUNTER — Encounter

## 2025-03-31 ENCOUNTER — Encounter
